# Patient Record
Sex: Male | Born: 1935
Health system: Southern US, Community
[De-identification: ages and names within clinical notes are randomized; demographics above are authoritative.]

## PROBLEM LIST (undated history)

## (undated) DIAGNOSIS — I499 Cardiac arrhythmia, unspecified: Secondary | ICD-10-CM

## (undated) DIAGNOSIS — I639 Cerebral infarction, unspecified: Secondary | ICD-10-CM

## (undated) DIAGNOSIS — R011 Cardiac murmur, unspecified: Secondary | ICD-10-CM

## (undated) DIAGNOSIS — I428 Other cardiomyopathies: Secondary | ICD-10-CM

## (undated) DIAGNOSIS — I255 Ischemic cardiomyopathy: Secondary | ICD-10-CM

## (undated) DIAGNOSIS — N183 Chronic kidney disease, stage 3 unspecified: Secondary | ICD-10-CM

## (undated) DIAGNOSIS — I10 Essential (primary) hypertension: Secondary | ICD-10-CM

## (undated) DIAGNOSIS — M1990 Unspecified osteoarthritis, unspecified site: Secondary | ICD-10-CM

## (undated) DIAGNOSIS — I2510 Atherosclerotic heart disease of native coronary artery without angina pectoris: Secondary | ICD-10-CM

## (undated) DIAGNOSIS — I498 Other specified cardiac arrhythmias: Secondary | ICD-10-CM

## (undated) DIAGNOSIS — I350 Nonrheumatic aortic (valve) stenosis: Secondary | ICD-10-CM

## (undated) DIAGNOSIS — I714 Abdominal aortic aneurysm, without rupture, unspecified: Secondary | ICD-10-CM

## (undated) DIAGNOSIS — I779 Disorder of arteries and arterioles, unspecified: Secondary | ICD-10-CM

## (undated) DIAGNOSIS — J189 Pneumonia, unspecified organism: Secondary | ICD-10-CM

## (undated) HISTORY — PX: TONSILLECTOMY: SUR1361

## (undated) HISTORY — PX: KNEE SURGERY: SHX244

## (undated) HISTORY — DX: Disorder of arteries and arterioles, unspecified: I77.9

## (undated) HISTORY — DX: Other cardiomyopathies: I42.8

## (undated) HISTORY — PX: SHOULDER SURGERY: SHX246

## (undated) HISTORY — DX: Nonrheumatic aortic (valve) stenosis: I35.0

## (undated) HISTORY — DX: Cerebral infarction, unspecified: I63.9

---
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
## 1998-06-25 ENCOUNTER — Emergency Department (HOSPITAL_COMMUNITY): Admission: EM | Admit: 1998-06-25 | Discharge: 1998-06-25 | Payer: Self-pay | Admitting: Emergency Medicine

## 1998-06-25 ENCOUNTER — Encounter: Payer: Self-pay | Admitting: Emergency Medicine

## 1998-06-29 ENCOUNTER — Ambulatory Visit (HOSPITAL_COMMUNITY): Admission: RE | Admit: 1998-06-29 | Discharge: 1998-06-29 | Payer: Self-pay | Admitting: Neurology

## 1998-06-29 ENCOUNTER — Encounter: Payer: Self-pay | Admitting: Neurology

## 2010-02-20 ENCOUNTER — Inpatient Hospital Stay (HOSPITAL_COMMUNITY): Admission: RE | Admit: 2010-02-20 | Discharge: 2010-02-23 | Payer: Self-pay | Admitting: Orthopedic Surgery

## 2010-04-16 ENCOUNTER — Inpatient Hospital Stay (HOSPITAL_COMMUNITY): Admission: EM | Admit: 2010-04-16 | Discharge: 2010-04-17 | Payer: Self-pay | Admitting: Emergency Medicine

## 2010-04-16 ENCOUNTER — Ambulatory Visit: Payer: Self-pay | Admitting: Vascular Surgery

## 2010-04-16 ENCOUNTER — Encounter (INDEPENDENT_AMBULATORY_CARE_PROVIDER_SITE_OTHER): Payer: Self-pay | Admitting: Emergency Medicine

## 2010-04-16 ENCOUNTER — Ambulatory Visit: Payer: Self-pay | Admitting: Family Medicine

## 2010-05-29 ENCOUNTER — Inpatient Hospital Stay (HOSPITAL_COMMUNITY): Admission: RE | Admit: 2010-05-29 | Discharge: 2010-06-01 | Payer: Self-pay | Admitting: Orthopedic Surgery

## 2010-08-21 ENCOUNTER — Ambulatory Visit (HOSPITAL_COMMUNITY): Admission: RE | Admit: 2010-08-21 | Payer: Self-pay | Source: Home / Self Care | Admitting: Orthopedic Surgery

## 2010-10-25 LAB — BASIC METABOLIC PANEL
BUN: 14 mg/dL (ref 6–23)
BUN: 18 mg/dL (ref 6–23)
BUN: 25 mg/dL — ABNORMAL HIGH (ref 6–23)
CO2: 26 mEq/L (ref 19–32)
Calcium: 9 mg/dL (ref 8.4–10.5)
Chloride: 107 mEq/L (ref 96–112)
Chloride: 109 mEq/L (ref 96–112)
Creatinine, Ser: 1.27 mg/dL (ref 0.4–1.5)
Creatinine, Ser: 1.28 mg/dL (ref 0.4–1.5)
Creatinine, Ser: 1.42 mg/dL (ref 0.4–1.5)
GFR calc non Af Amer: 54 mL/min — ABNORMAL LOW (ref >60)
Glucose, Bld: 108 mg/dL — ABNORMAL HIGH (ref 70–99)
Glucose, Bld: 113 mg/dL — ABNORMAL HIGH (ref 70–99)
Potassium: 3.8 mEq/L (ref 3.5–5.1)

## 2010-10-25 LAB — CBC
HCT: 30.1 % — ABNORMAL LOW (ref 39.0–52.0)
MCH: 27.6 pg (ref 26.0–34.0)
MCH: 27.8 pg (ref 26.0–34.0)
MCHC: 32.6 g/dL (ref 30.0–36.0)
MCHC: 32.7 g/dL (ref 30.0–36.0)
MCV: 84.5 fL (ref 78.0–100.0)
MCV: 84.6 fL (ref 78.0–100.0)
Platelets: 186 10*3/uL (ref 150–400)
Platelets: 194 10*3/uL (ref 150–400)
Platelets: 215 10*3/uL (ref 150–400)
RBC: 3.56 MIL/uL — ABNORMAL LOW (ref 4.22–5.81)
RDW: 14.6 % (ref 11.5–15.5)
RDW: 14.9 % (ref 11.5–15.5)
RDW: 14.9 % (ref 11.5–15.5)
WBC: 8.8 10*3/uL (ref 4.0–10.5)
WBC: 9.3 10*3/uL (ref 4.0–10.5)

## 2010-10-26 LAB — DIFFERENTIAL
Basophils Absolute: 0 10*3/uL (ref 0.0–0.1)
Basophils Absolute: 0 10*3/uL (ref 0.0–0.1)
Eosinophils Absolute: 0.2 10*3/uL (ref 0.0–0.7)
Eosinophils Relative: 2 % (ref 0–5)
Eosinophils Relative: 4 % (ref 0–5)
Lymphocytes Relative: 17 % (ref 12–46)
Lymphocytes Relative: 16 % (ref 12–46)
Lymphocytes Relative: 7 % — ABNORMAL LOW (ref 12–46)
Lymphs Abs: 1.6 10*3/uL (ref 0.7–4.0)
Lymphs Abs: 0.9 10*3/uL (ref 0.7–4.0)
Lymphs Abs: 1 10*3/uL (ref 0.7–4.0)
Monocytes Absolute: 0.5 10*3/uL (ref 0.1–1.0)
Monocytes Absolute: 0.4 10*3/uL (ref 0.1–1.0)
Monocytes Absolute: 0.5 10*3/uL (ref 0.1–1.0)
Monocytes Relative: 7 % (ref 3–12)
Neutro Abs: 11.1 10*3/uL — ABNORMAL HIGH (ref 1.7–7.7)

## 2010-10-26 LAB — COMPREHENSIVE METABOLIC PANEL
ALT: 18 U/L (ref 0–53)
AST: 22 U/L (ref 0–37)
AST: 18 U/L (ref 0–37)
Albumin: 4.3 g/dL (ref 3.5–5.2)
Albumin: 3.5 g/dL (ref 3.5–5.2)
CO2: 24 mEq/L (ref 19–32)
Calcium: 9 mg/dL (ref 8.4–10.5)
Chloride: 109 mEq/L (ref 96–112)
Creatinine, Ser: 1.62 mg/dL — ABNORMAL HIGH (ref 0.4–1.5)
GFR calc Af Amer: 55 mL/min — ABNORMAL LOW (ref >60)
GFR calc Af Amer: 50 mL/min — ABNORMAL LOW (ref >60)
GFR calc non Af Amer: 46 mL/min — ABNORMAL LOW (ref >60)
Sodium: 140 mEq/L (ref 135–145)
Total Bilirubin: 0.6 mg/dL (ref 0.3–1.2)

## 2010-10-26 LAB — POCT CARDIAC MARKERS
CKMB, poc: <1 ng/mL — ABNORMAL LOW (ref 1.0–8.0)
Myoglobin, poc: 142 ng/mL (ref 12–200)
Troponin i, poc: <0.05 ng/mL (ref 0.00–0.09)

## 2010-10-26 LAB — CROSSMATCH: Unit division: 0

## 2010-10-26 LAB — CBC
HCT: 35.5 % — ABNORMAL LOW (ref 39.0–52.0)
Hemoglobin: 13.5 g/dL (ref 13.0–17.0)
MCH: 27.2 pg (ref 26.0–34.0)
MCHC: 32.3 g/dL (ref 30.0–36.0)
Platelets: 250 10*3/uL (ref 150–400)
Platelets: 206 10*3/uL (ref 150–400)
RBC: 4.79 MIL/uL (ref 4.22–5.81)
RBC: 3.93 MIL/uL — ABNORMAL LOW (ref 4.22–5.81)
RBC: 4.2 MIL/uL — ABNORMAL LOW (ref 4.22–5.81)
RDW: 15 % (ref 11.5–15.5)
WBC: 9.1 10*3/uL (ref 4.0–10.5)
WBC: 12.6 10*3/uL — ABNORMAL HIGH (ref 4.0–10.5)

## 2010-10-26 LAB — URINALYSIS, ROUTINE W REFLEX MICROSCOPIC
Bilirubin Urine: NEGATIVE
Nitrite: NEGATIVE
Specific Gravity, Urine: 1.023 (ref 1.005–1.030)
Urobilinogen, UA: 1 mg/dL (ref 0.0–1.0)
pH: 5.5 (ref 5.0–8.0)

## 2010-10-26 LAB — BASIC METABOLIC PANEL
BUN: 40 mg/dL — ABNORMAL HIGH (ref 6–23)
Chloride: 111 mEq/L (ref 96–112)
GFR calc Af Amer: 38 mL/min — ABNORMAL LOW (ref >60)
GFR calc non Af Amer: 32 mL/min — ABNORMAL LOW (ref >60)
Potassium: 4.7 mEq/L (ref 3.5–5.1)
Sodium: 140 mEq/L (ref 135–145)

## 2010-10-26 LAB — URINE CULTURE: Culture  Setup Time: 201110140955

## 2010-10-26 LAB — PROTIME-INR: Prothrombin Time: 14.4 seconds (ref 11.6–15.2)

## 2010-10-26 LAB — SURGICAL PCR SCREEN
MRSA, PCR: NEGATIVE
Staphylococcus aureus: NEGATIVE

## 2010-10-26 LAB — PREALBUMIN: Prealbumin: 22.2 mg/dL (ref 18.0–45.0)

## 2010-10-26 LAB — D-DIMER, QUANTITATIVE: D-Dimer, Quant: 1.94 ug/mL-FEU — ABNORMAL HIGH (ref 0.00–0.48)

## 2010-10-29 LAB — BASIC METABOLIC PANEL
BUN: 19 mg/dL (ref 6–23)
BUN: 24 mg/dL — ABNORMAL HIGH (ref 6–23)
BUN: 29 mg/dL — ABNORMAL HIGH (ref 6–23)
CO2: 29 mEq/L (ref 19–32)
Calcium: 8.7 mg/dL (ref 8.4–10.5)
Calcium: 8.9 mg/dL (ref 8.4–10.5)
Chloride: 102 mEq/L (ref 96–112)
Chloride: 108 mEq/L (ref 96–112)
Creatinine, Ser: 1.4 mg/dL (ref 0.4–1.5)
Creatinine, Ser: 1.49 mg/dL (ref 0.4–1.5)
Creatinine, Ser: 1.64 mg/dL — ABNORMAL HIGH (ref 0.4–1.5)
GFR calc Af Amer: 59 mL/min — ABNORMAL LOW (ref >60)
GFR calc non Af Amer: 49 mL/min — ABNORMAL LOW (ref >60)
Glucose, Bld: 150 mg/dL — ABNORMAL HIGH (ref 70–99)
Potassium: 3.8 mEq/L (ref 3.5–5.1)

## 2010-10-29 LAB — COMPREHENSIVE METABOLIC PANEL
ALT: 17 U/L (ref 0–53)
AST: 18 U/L (ref 0–37)
CO2: 23 mEq/L (ref 19–32)
Calcium: 9.8 mg/dL (ref 8.4–10.5)
Chloride: 107 mEq/L (ref 96–112)
Creatinine, Ser: 1.51 mg/dL — ABNORMAL HIGH (ref 0.4–1.5)
GFR calc non Af Amer: 45 mL/min — ABNORMAL LOW (ref >60)
Glucose, Bld: 116 mg/dL — ABNORMAL HIGH (ref 70–99)
Total Bilirubin: 0.5 mg/dL (ref 0.3–1.2)

## 2010-10-29 LAB — CBC
HCT: 41.5 % (ref 39.0–52.0)
Hemoglobin: 13.7 g/dL (ref 13.0–17.0)
MCH: 28.4 pg (ref 26.0–34.0)
MCH: 28.5 pg (ref 26.0–34.0)
MCH: 28.9 pg (ref 26.0–34.0)
MCHC: 33 g/dL (ref 30.0–36.0)
MCHC: 33 g/dL (ref 30.0–36.0)
MCV: 86.2 fL (ref 78.0–100.0)
MCV: 86.3 fL (ref 78.0–100.0)
Platelets: 180 10*3/uL (ref 150–400)
Platelets: 188 10*3/uL (ref 150–400)
Platelets: 204 10*3/uL (ref 150–400)
RBC: 3.87 MIL/uL — ABNORMAL LOW (ref 4.22–5.81)
RBC: 4.83 MIL/uL (ref 4.22–5.81)
RDW: 14.4 % (ref 11.5–15.5)
RDW: 14.5 % (ref 11.5–15.5)
RDW: 14.8 % (ref 11.5–15.5)
WBC: 8 10*3/uL (ref 4.0–10.5)
WBC: 8.8 10*3/uL (ref 4.0–10.5)

## 2010-10-29 LAB — DIFFERENTIAL
Basophils Absolute: 0 10*3/uL (ref 0.0–0.1)
Eosinophils Absolute: 0.2 10*3/uL (ref 0.0–0.7)
Eosinophils Relative: 2 % (ref 0–5)
Lymphocytes Relative: 14 % (ref 12–46)
Lymphs Abs: 1.3 10*3/uL (ref 0.7–4.0)
Neutrophils Relative %: 78 % — ABNORMAL HIGH (ref 43–77)

## 2010-10-29 LAB — PROTIME-INR
INR: 1.11 (ref 0.00–1.49)
Prothrombin Time: 14.2 seconds (ref 11.6–15.2)

## 2010-10-29 LAB — URINE CULTURE
Colony Count: NO GROWTH
Culture: NO GROWTH

## 2010-10-29 LAB — URINALYSIS, ROUTINE W REFLEX MICROSCOPIC
Glucose, UA: NEGATIVE mg/dL
Hgb urine dipstick: NEGATIVE
Ketones, ur: NEGATIVE mg/dL
Protein, ur: NEGATIVE mg/dL
Urobilinogen, UA: 0.2 mg/dL (ref 0.0–1.0)

## 2010-10-29 LAB — TYPE AND SCREEN
ABO/RH(D): A POS
Antibody Screen: NEGATIVE

## 2011-12-04 DIAGNOSIS — Z79899 Other long term (current) drug therapy: Secondary | ICD-10-CM | POA: Diagnosis not present

## 2011-12-04 DIAGNOSIS — E782 Mixed hyperlipidemia: Secondary | ICD-10-CM | POA: Diagnosis not present

## 2011-12-04 DIAGNOSIS — Z125 Encounter for screening for malignant neoplasm of prostate: Secondary | ICD-10-CM | POA: Diagnosis not present

## 2011-12-04 DIAGNOSIS — I10 Essential (primary) hypertension: Secondary | ICD-10-CM | POA: Diagnosis not present

## 2012-06-05 DIAGNOSIS — E782 Mixed hyperlipidemia: Secondary | ICD-10-CM | POA: Diagnosis not present

## 2012-06-05 DIAGNOSIS — I10 Essential (primary) hypertension: Secondary | ICD-10-CM | POA: Diagnosis not present

## 2012-06-05 DIAGNOSIS — E786 Lipoprotein deficiency: Secondary | ICD-10-CM | POA: Diagnosis not present

## 2012-12-10 DIAGNOSIS — E782 Mixed hyperlipidemia: Secondary | ICD-10-CM | POA: Diagnosis not present

## 2012-12-10 DIAGNOSIS — N183 Chronic kidney disease, stage 3 unspecified: Secondary | ICD-10-CM | POA: Diagnosis not present

## 2012-12-10 DIAGNOSIS — Z6835 Body mass index (BMI) 35.0-35.9, adult: Secondary | ICD-10-CM | POA: Diagnosis not present

## 2012-12-10 DIAGNOSIS — Z9181 History of falling: Secondary | ICD-10-CM | POA: Diagnosis not present

## 2012-12-10 DIAGNOSIS — Z125 Encounter for screening for malignant neoplasm of prostate: Secondary | ICD-10-CM | POA: Diagnosis not present

## 2012-12-10 DIAGNOSIS — Z1331 Encounter for screening for depression: Secondary | ICD-10-CM | POA: Diagnosis not present

## 2012-12-10 DIAGNOSIS — I10 Essential (primary) hypertension: Secondary | ICD-10-CM | POA: Diagnosis not present

## 2013-06-16 DIAGNOSIS — E782 Mixed hyperlipidemia: Secondary | ICD-10-CM | POA: Diagnosis not present

## 2013-06-16 DIAGNOSIS — N183 Chronic kidney disease, stage 3 unspecified: Secondary | ICD-10-CM | POA: Diagnosis not present

## 2013-06-16 DIAGNOSIS — I10 Essential (primary) hypertension: Secondary | ICD-10-CM | POA: Diagnosis not present

## 2013-07-04 ENCOUNTER — Encounter (HOSPITAL_COMMUNITY): Payer: Self-pay | Admitting: Emergency Medicine

## 2013-07-04 ENCOUNTER — Emergency Department (HOSPITAL_COMMUNITY): Payer: Medicare Other

## 2013-07-04 ENCOUNTER — Emergency Department (HOSPITAL_COMMUNITY)
Admission: EM | Admit: 2013-07-04 | Discharge: 2013-07-04 | Disposition: A | Discharge status: Home / Self Care | Payer: Medicare Other | Attending: Emergency Medicine | Admitting: Emergency Medicine

## 2013-07-04 DIAGNOSIS — R079 Chest pain, unspecified: Secondary | ICD-10-CM | POA: Diagnosis not present

## 2013-07-04 DIAGNOSIS — I10 Essential (primary) hypertension: Secondary | ICD-10-CM | POA: Insufficient documentation

## 2013-07-04 DIAGNOSIS — R112 Nausea with vomiting, unspecified: Secondary | ICD-10-CM

## 2013-07-04 DIAGNOSIS — R110 Nausea: Secondary | ICD-10-CM | POA: Diagnosis not present

## 2013-07-04 DIAGNOSIS — Z79899 Other long term (current) drug therapy: Secondary | ICD-10-CM | POA: Diagnosis not present

## 2013-07-04 DIAGNOSIS — E669 Obesity, unspecified: Secondary | ICD-10-CM | POA: Diagnosis not present

## 2013-07-04 DIAGNOSIS — R1013 Epigastric pain: Secondary | ICD-10-CM

## 2013-07-04 DIAGNOSIS — R231 Pallor: Secondary | ICD-10-CM | POA: Diagnosis not present

## 2013-07-04 DIAGNOSIS — K2970 Gastritis, unspecified, without bleeding: Secondary | ICD-10-CM | POA: Diagnosis not present

## 2013-07-04 DIAGNOSIS — I700 Atherosclerosis of aorta: Secondary | ICD-10-CM | POA: Diagnosis not present

## 2013-07-04 LAB — COMPREHENSIVE METABOLIC PANEL
Albumin: 3.7 g/dL (ref 3.5–5.2)
Alkaline Phosphatase: 75 U/L (ref 39–117)
BUN: 31 mg/dL — ABNORMAL HIGH (ref 6–23)
Chloride: 104 mEq/L (ref 96–112)
GFR calc Af Amer: 53 mL/min — ABNORMAL LOW (ref >90)
Glucose, Bld: 149 mg/dL — ABNORMAL HIGH (ref 70–99)
Potassium: 4.5 mEq/L (ref 3.5–5.1)
Total Bilirubin: 0.2 mg/dL — ABNORMAL LOW (ref 0.3–1.2)

## 2013-07-04 LAB — URINALYSIS, ROUTINE W REFLEX MICROSCOPIC
Hgb urine dipstick: NEGATIVE
Nitrite: NEGATIVE
Protein, ur: NEGATIVE mg/dL
Specific Gravity, Urine: 1.018 (ref 1.005–1.030)
Urobilinogen, UA: 1 mg/dL (ref 0.0–1.0)
pH: 6 (ref 5.0–8.0)

## 2013-07-04 LAB — CBC WITH DIFFERENTIAL/PLATELET
Hemoglobin: 13.6 g/dL (ref 13.0–17.0)
Lymphocytes Relative: 10 % — ABNORMAL LOW (ref 12–46)
Lymphs Abs: 1.1 10*3/uL (ref 0.7–4.0)
Monocytes Relative: 4 % (ref 3–12)
Neutro Abs: 9.6 10*3/uL — ABNORMAL HIGH (ref 1.7–7.7)
Neutrophils Relative %: 85 % — ABNORMAL HIGH (ref 43–77)
RBC: 4.55 MIL/uL (ref 4.22–5.81)

## 2013-07-04 LAB — POCT I-STAT TROPONIN I: Troponin i, poc: 0 ng/mL (ref 0.00–0.08)

## 2013-07-04 LAB — OCCULT BLOOD, POC DEVICE: Fecal Occult Bld: NEGATIVE

## 2013-07-04 LAB — LACTIC ACID, PLASMA: Lactic Acid, Venous: 0.8 mmol/L (ref 0.5–2.2)

## 2013-07-04 LAB — LIPASE, BLOOD: Lipase: 28 U/L (ref 11–59)

## 2013-07-04 LAB — URINE MICROSCOPIC-ADD ON

## 2013-07-04 MED ORDER — FAMOTIDINE IN NACL 20-0.9 MG/50ML-% IV SOLN
20.0000 mg | Freq: Once | INTRAVENOUS | Status: AC
  Administered 2013-07-04: 20 mg via INTRAVENOUS
  Filled 2013-07-04: qty 50

## 2013-07-04 MED ORDER — ONDANSETRON HCL 4 MG/2ML IJ SOLN
4.0000 mg | Freq: Once | INTRAMUSCULAR | Status: AC
  Administered 2013-07-04: 4 mg via INTRAVENOUS
  Filled 2013-07-04: qty 2

## 2013-07-04 MED ORDER — MORPHINE SULFATE 4 MG/ML IJ SOLN
4.0000 mg | Freq: Once | INTRAMUSCULAR | Status: AC
  Administered 2013-07-04: 4 mg via INTRAVENOUS
  Filled 2013-07-04: qty 1

## 2013-07-04 MED ORDER — SODIUM CHLORIDE 0.9 % IV SOLN
Freq: Once | INTRAVENOUS | Status: AC
  Administered 2013-07-04: 1000 mL via INTRAVENOUS

## 2013-07-04 MED ORDER — ONDANSETRON 4 MG PO TBDP: 4.0000 mg | ORAL_TABLET | Freq: Three times a day (TID) | ORAL | Status: DC | PRN

## 2013-07-04 MED ORDER — IOHEXOL 300 MG/ML  SOLN
25.0000 mL | INTRAMUSCULAR | Status: AC | PRN
  Administered 2013-07-04 (×2): 25 mL via ORAL

## 2013-07-04 MED ORDER — IOHEXOL 300 MG/ML  SOLN
80.0000 mL | Freq: Once | INTRAMUSCULAR | Status: AC | PRN
  Administered 2013-07-04: 80 mL via INTRAVENOUS

## 2013-07-04 NOTE — ED Provider Notes (Addendum)
Complaint of epigastric pain, nonradiating accompanied multiple episodes of vomiting onset 10 PM tonight. Denies chest pain denies shortness of breath. This had similar pain in the past however did not learn etiology. Denies diarrhea denies fever. And exam patient is alert Glasgow Coma Score 15. Minimally tender at theepigastruim    Date: 07/04/2013  Rate: 53  Rhythm: sinus bradycardia and premature ventricular contractions (PVC)  QRS Axis: normal  Intervals: normal  ST/T Wave abnormalities: nonspecific T wave changes  Conduction Disutrbances:none  Narrative Interpretation:   Old EKG Reviewed: none available  Doug Sou, MD 07/04/13 0730  Pt signed out to Dr. Adriana Simas 830 am  Doug Sou, MD 07/04/13 [phone]

## 2013-07-04 NOTE — ED Notes (Signed)
phlebotomy at bedside.  

## 2013-07-04 NOTE — ED Provider Notes (Signed)
Medical screening examination/treatment/procedure(s) were conducted as a shared visit with non-physician practitioner(s) and myself.  I personally evaluated the patient during the encounter.  EKG Interpretation   None        Doug Sou, MD 07/04/13 [phone]

## 2013-07-04 NOTE — ED Provider Notes (Signed)
CSN: [id number]     Arrival date & time 07/04/13  0201 History   First MD Initiated Contact with Patient 07/04/13 0242     Chief Complaint  Patient presents with  . Abdominal Pain   (Consider location/radiation/quality/duration/timing/severity/associated sxs/prior Treatment) HPI Comments: Patient awakened from sleep with epigastric fullness and tightness with nausea. Denies SOB, diaphoresis.   Patient is a 79 y.o. male presenting with abdominal pain. The history is provided by the patient.  Abdominal Pain Pain location:  Epigastric Pain quality: fullness and heavy   Pain radiates to:  Does not radiate Pain severity:  Moderate Onset quality:  Sudden Duration:  5 hours Timing:  Constant Progression:  Unchanged Chronicity:  New Context: awakening from sleep   Context: not diet changes and not retching   Relieved by:  None tried Worsened by:  Nothing tried Ineffective treatments:  None tried Associated symptoms: chest pain and nausea   Associated symptoms: no constipation, no cough, no diarrhea, no fever, no shortness of breath and no vomiting   Risk factors: being elderly and obesity     Past Medical History  Diagnosis Date  . Hypertension    Past Surgical History  Procedure Laterality Date  . Knee surgery     History reviewed. No pertinent family history. History  Substance Use Topics  . Smoking status: Not on file  . Smokeless tobacco: Not on file  . Alcohol Use: Not on file    Review of Systems  Constitutional: Negative for fever.  Respiratory: Negative for cough and shortness of breath.   Cardiovascular: Positive for chest pain.  Gastrointestinal: Positive for nausea and abdominal pain. Negative for vomiting, diarrhea and constipation.  Skin: Positive for pallor.  All other systems reviewed and are negative.    Allergies  Review of patient's allergies indicates no known allergies.  Home Medications   Current Outpatient Rx  Name  Route  Sig  Dispense   Refill  . PRESCRIPTION MEDICATION   Oral   Take 1 tablet by mouth daily. Blood Pressure          BP 151/59  Pulse 55  Temp(Src) 97.3 F (36.3 C) (Oral)  Resp 17  SpO2 91% Physical Exam  Nursing note and vitals reviewed. Constitutional: He appears well-developed and well-nourished.  obese  HENT:  Head: Normocephalic and atraumatic.  Eyes: Pupils are equal, round, and reactive to light.  Neck: Normal range of motion.  Cardiovascular: Normal rate and regular rhythm.   Pulmonary/Chest: Effort normal and breath sounds normal.  Abdominal: Soft. There is no tenderness.  Musculoskeletal: Normal range of motion.  Neurological: He is alert.  Skin: Skin is warm and dry. There is pallor.    ED Course  Procedures (including critical care time) Labs Review Labs Reviewed  CBC WITH DIFFERENTIAL - Abnormal; Notable for the following:    WBC 11.4 (*)    Neutrophils Relative % 85 (*)    Neutro Abs 9.6 (*)    Lymphocytes Relative 10 (*)    All other components within normal limits  COMPREHENSIVE METABOLIC PANEL - Abnormal; Notable for the following:    Glucose, Bld 149 (*)    BUN 31 (*)    Creatinine, Ser 1.37 (*)    Total Bilirubin 0.2 (*)    GFR calc non Af Amer 46 (*)    GFR calc Af Amer 53 (*)    All other components within normal limits  LACTIC ACID, PLASMA  LIPASE, BLOOD  URINALYSIS, ROUTINE W REFLEX MICROSCOPIC  POCT I-STAT TROPONIN I  OCCULT BLOOD, POC DEVICE  POCT I-STAT TROPONIN I   Imaging Review Dg Chest Port 1 View  07/04/2013   CLINICAL DATA:  Chest pain  EXAM: PORTABLE CHEST - 1 VIEW  COMPARISON:  04/16/2010  FINDINGS: Normal heart size. Aortic atherosclerosis. No edema or infiltrate. No effusion or pneumothorax. Remote posterior right rib fractures. No acute osseous findings.  IMPRESSION: No active disease.   Electronically Signed   By: Tiburcio Pea M.D.   On: 07/04/2013 03:31    EKG Interpretation   None       MDM  No diagnosis found. Will obtain  CT Abdomen    Arman Filter, NP 07/04/13 [phone]

## 2013-07-04 NOTE — ED Notes (Signed)
CT informed that the pt has finished his contrast.

## 2013-07-04 NOTE — ED Provider Notes (Signed)
Patient received from NP Manus Rudd at shift change.  79 year old male presenting with epigastric pain for the past 5 hours. No associated nausea, vomiting, or diarrhea.  Labs were ordered chest x-ray obtained. Patient given medication without any improvement.  Plan:  CT abdomen and pelvis pending. Disposition following results.  Results for orders placed during the hospital encounter of 07/04/13  CBC WITH DIFFERENTIAL      Result Value Range   WBC 11.4 (*) 4.0 - 10.5 K/uL   RBC 4.55  4.22 - 5.81 MIL/uL   Hemoglobin 13.6  13.0 - 17.0 g/dL   HCT 91.4  78.2 - 95.6 %   MCV 87.5  78.0 - 100.0 fL   MCH 29.9  26.0 - 34.0 pg   MCHC 34.2  30.0 - 36.0 g/dL   RDW 21.3  08.6 - 57.8 %   Platelets 217  150 - 400 K/uL   Neutrophils Relative % 85 (*) 43 - 77 %   Neutro Abs 9.6 (*) 1.7 - 7.7 K/uL   Lymphocytes Relative 10 (*) 12 - 46 %   Lymphs Abs 1.1  0.7 - 4.0 K/uL   Monocytes Relative 4  3 - 12 %   Monocytes Absolute 0.5  0.1 - 1.0 K/uL   Eosinophils Relative 1  0 - 5 %   Eosinophils Absolute 0.1  0.0 - 0.7 K/uL   Basophils Relative 0  0 - 1 %   Basophils Absolute 0.0  0.0 - 0.1 K/uL  COMPREHENSIVE METABOLIC PANEL      Result Value Range   Sodium 137  135 - 145 mEq/L   Potassium 4.5  3.5 - 5.1 mEq/L   Chloride 104  96 - 112 mEq/L   CO2 23  19 - 32 mEq/L   Glucose, Bld 149 (*) 70 - 99 mg/dL   BUN 31 (*) 6 - 23 mg/dL   Creatinine, Ser 4.69 (*) 0.50 - 1.35 mg/dL   Calcium 9.4  8.4 - 62.9 mg/dL   Total Protein 6.7  6.0 - 8.3 g/dL   Albumin 3.7  3.5 - 5.2 g/dL   AST 20  0 - 37 U/L   ALT 18  0 - 53 U/L   Alkaline Phosphatase 75  39 - 117 U/L   Total Bilirubin 0.2 (*) 0.3 - 1.2 mg/dL   GFR calc non Af Amer 46 (*) >90 mL/min   GFR calc Af Amer 53 (*) >90 mL/min  LACTIC ACID, PLASMA      Result Value Range   Lactic Acid, Venous 0.8  0.5 - 2.2 mmol/L  LIPASE, BLOOD      Result Value Range   Lipase 28  11 - 59 U/L  URINALYSIS, ROUTINE W REFLEX MICROSCOPIC      Result Value Range   Color,  Urine YELLOW  YELLOW   APPearance CLEAR  CLEAR   Specific Gravity, Urine 1.018  1.005 - 1.030   pH 6.0  5.0 - 8.0   Glucose, UA NEGATIVE  NEGATIVE mg/dL   Hgb urine dipstick NEGATIVE  NEGATIVE   Bilirubin Urine NEGATIVE  NEGATIVE   Ketones, ur NEGATIVE  NEGATIVE mg/dL   Protein, ur NEGATIVE  NEGATIVE mg/dL   Urobilinogen, UA 1.0  0.0 - 1.0 mg/dL   Nitrite NEGATIVE  NEGATIVE   Leukocytes, UA TRACE (*) NEGATIVE  URINE MICROSCOPIC-ADD ON      Result Value Range   WBC, UA 3-6  <3 WBC/hpf  POCT I-STAT TROPONIN I  Result Value Range   Troponin i, poc 0.00  0.00 - 0.08 ng/mL   Comment 3           OCCULT BLOOD, POC DEVICE      Result Value Range   Fecal Occult Bld NEGATIVE  NEGATIVE  POCT I-STAT TROPONIN I      Result Value Range   Troponin i, poc 0.00  0.00 - 0.08 ng/mL   Comment 3           POCT I-STAT TROPONIN I      Result Value Range   Troponin i, poc 0.00  0.00 - 0.08 ng/mL   Comment 3            Ct Abdomen Pelvis W Contrast  07/04/2013   CLINICAL DATA:  Mid abdominal pain.  Nausea and vomiting.  EXAM: CT ABDOMEN AND PELVIS WITH CONTRAST  TECHNIQUE: Multidetector CT imaging of the abdomen and pelvis was performed using the standard protocol following bolus administration of intravenous contrast.  CONTRAST:  80mL OMNIPAQUE IOHEXOL 300 MG/ML  SOLN  COMPARISON:  None.  FINDINGS: Visualized lung bases are unremarkable. Several small low-attenuation lesions of the liver likely represent benign cysts. The gallbladder, pancreas, spleen and adrenal glands are unremarkable. Lesion of the posterior mid left kidney originates from the cortex that extends in an exophytic fashion posteriorly. This 2.4 x 2.5 x 2.4 in diameter. Internal density is approximately 50-55 Hounsfield units on both venous and delayed phases and findings are either consistent with a hyperdense cyst or cystic/solid tumor with enhancement pattern not fully characterized by current CT. Consider further workup with either  MRI of the abdomen or followup CT to assess stability.  Bowel loops are of normal caliber and show no evidence of obstruction. No inflammatory process is seen. There is no evidence of free fluid or free air. The bladder is unremarkable. No masses or enlarged lymph nodes are seen. Atherosclerotic disease of the abdominal aorta present without aneurysm. No hernias are identified. Bony structures show moderate spondylosis of the lumbar spine, particularly at the L2-3 and L4-5 levels.  IMPRESSION: 1. No acute findings in the abdomen or pelvis. 2. 2.5 cm hyperdense exophytic lesion of the posterior left mid kidney with differential including hyperdense cyst or cystic/solid tumor. Enhancement pattern is not fully characterized. Consider further workup with either MRI of the abdomen or followup CT.   Electronically Signed   By: Irish Lack M.D.   On: 07/04/2013 10:14   Dg Chest Port 1 View  07/04/2013   CLINICAL DATA:  Chest pain  EXAM: PORTABLE CHEST - 1 VIEW  COMPARISON:  04/16/2010  FINDINGS: Normal heart size. Aortic atherosclerosis. No edema or infiltrate. No effusion or pneumothorax. Remote posterior right rib fractures. No acute osseous findings.  IMPRESSION: No active disease.   Electronically Signed   By: Tiburcio Pea M.D.   On: 07/04/2013 03:31   Labs as above.  CT abd/pelvis revealing lesion of left mid-kidney-- possible benign cyst vs tumor, recommended FU MRI or CT in a few weeks.  Pt re-evaluated, states is now pain free.  Discussed lab and imaging results with pt, he acknowledged understanding.  FU with PCP to discuss this ED visit and schedule repeat CT in 6-8 weeks-- copies of labs and imaging studies given for physician review. Discussed plan with pt, he agreed.  Return precautions advised.  Discussed pt with Dr. Adriana Simas who agrees with assessment and plan of care.   Garlon Hatchet, PA-C 07/04/13 1312

## 2013-07-04 NOTE — ED Notes (Signed)
Pt alert and mentating appropriately. Pt given d/c teaching, follow up care and prescription. Pt verbalizes understanding and has no further questions upon d/c. Pt requested wheelchair to get to lobby to wait for daughter. NAD noted upon d/c.

## 2013-07-04 NOTE — ED Notes (Signed)
Pt resting in bed. Pt states he does not want anything for pain or nausea that this time. Notified pt of delay in going to CT.

## 2013-07-04 NOTE — ED Notes (Signed)
Pt returned from CT and placed back on monitor. NAD noted at this time. Pt denies further needs at this time.

## 2013-07-04 NOTE — ED Notes (Signed)
Pt ambulatory leaving ER. Pt does not need wheelchair at this time. Pt shown to lobby to wait for daughter to come pick him up. NAD noted. Ambulatory with steady gait.

## 2013-07-04 NOTE — ED Notes (Signed)
Pt awoke with epigastric pain about 10 o'clock tonight; no radiation reported.  Pt denies shortness of breath, diaphoresis.  Pt [place] [facility] [place] [facility] and EMS brought pt here.  Pt sinus brady with frequent PVC's.  No cardiac hx reported.  Pt has hx of hypertension.

## 2013-07-17 DIAGNOSIS — N289 Disorder of kidney and ureter, unspecified: Secondary | ICD-10-CM | POA: Diagnosis not present

## 2013-07-20 ENCOUNTER — Other Ambulatory Visit: Payer: Self-pay | Admitting: Family Medicine

## 2013-07-20 DIAGNOSIS — N2889 Other specified disorders of kidney and ureter: Secondary | ICD-10-CM

## 2013-07-21 ENCOUNTER — Ambulatory Visit
Admission: RE | Admit: 2013-07-21 | Discharge: 2013-07-21 | Disposition: A | Discharge status: Home / Self Care | Payer: Medicare Other | Source: Ambulatory Visit | Attending: Family Medicine | Admitting: Family Medicine

## 2013-07-21 DIAGNOSIS — K7689 Other specified diseases of liver: Secondary | ICD-10-CM | POA: Diagnosis not present

## 2013-07-21 DIAGNOSIS — N2889 Other specified disorders of kidney and ureter: Secondary | ICD-10-CM

## 2013-07-21 MED ORDER — GADOBENATE DIMEGLUMINE 529 MG/ML IV SOLN
20.0000 mL | Freq: Once | INTRAVENOUS | Status: AC | PRN
  Administered 2013-07-21: 20 mL via INTRAVENOUS

## 2013-12-14 DIAGNOSIS — Z6834 Body mass index (BMI) 34.0-34.9, adult: Secondary | ICD-10-CM | POA: Diagnosis not present

## 2013-12-14 DIAGNOSIS — Z125 Encounter for screening for malignant neoplasm of prostate: Secondary | ICD-10-CM | POA: Diagnosis not present

## 2013-12-14 DIAGNOSIS — I10 Essential (primary) hypertension: Secondary | ICD-10-CM | POA: Diagnosis not present

## 2013-12-14 DIAGNOSIS — N183 Chronic kidney disease, stage 3 unspecified: Secondary | ICD-10-CM | POA: Diagnosis not present

## 2013-12-14 DIAGNOSIS — E782 Mixed hyperlipidemia: Secondary | ICD-10-CM | POA: Diagnosis not present

## 2013-12-14 DIAGNOSIS — Z1331 Encounter for screening for depression: Secondary | ICD-10-CM | POA: Diagnosis not present

## 2013-12-14 DIAGNOSIS — Z9181 History of falling: Secondary | ICD-10-CM | POA: Diagnosis not present

## 2014-06-16 DIAGNOSIS — I10 Essential (primary) hypertension: Secondary | ICD-10-CM | POA: Diagnosis not present

## 2014-06-16 DIAGNOSIS — N183 Chronic kidney disease, stage 3 (moderate): Secondary | ICD-10-CM | POA: Diagnosis not present

## 2014-06-16 DIAGNOSIS — E782 Mixed hyperlipidemia: Secondary | ICD-10-CM | POA: Diagnosis not present

## 2014-12-17 DIAGNOSIS — N183 Chronic kidney disease, stage 3 (moderate): Secondary | ICD-10-CM | POA: Diagnosis not present

## 2014-12-17 DIAGNOSIS — Z125 Encounter for screening for malignant neoplasm of prostate: Secondary | ICD-10-CM | POA: Diagnosis not present

## 2014-12-17 DIAGNOSIS — Z9181 History of falling: Secondary | ICD-10-CM | POA: Diagnosis not present

## 2014-12-17 DIAGNOSIS — Z6833 Body mass index (BMI) 33.0-33.9, adult: Secondary | ICD-10-CM | POA: Diagnosis not present

## 2014-12-17 DIAGNOSIS — E782 Mixed hyperlipidemia: Secondary | ICD-10-CM | POA: Diagnosis not present

## 2014-12-17 DIAGNOSIS — I10 Essential (primary) hypertension: Secondary | ICD-10-CM | POA: Diagnosis not present

## 2014-12-17 DIAGNOSIS — Z1389 Encounter for screening for other disorder: Secondary | ICD-10-CM | POA: Diagnosis not present

## 2014-12-17 DIAGNOSIS — R001 Bradycardia, unspecified: Secondary | ICD-10-CM | POA: Diagnosis not present

## 2014-12-21 ENCOUNTER — Inpatient Hospital Stay (HOSPITAL_COMMUNITY)
Admission: EM | Admit: 2014-12-21 | Discharge: 2014-12-24 | DRG: 287 | Disposition: A | Discharge status: Home / Self Care | Payer: Medicare Other | Attending: Internal Medicine | Admitting: Internal Medicine

## 2014-12-21 ENCOUNTER — Telehealth: Payer: Self-pay | Admitting: Cardiovascular Disease

## 2014-12-21 ENCOUNTER — Emergency Department (HOSPITAL_COMMUNITY): Payer: Medicare Other

## 2014-12-21 ENCOUNTER — Other Ambulatory Visit: Payer: Self-pay

## 2014-12-21 ENCOUNTER — Encounter (HOSPITAL_COMMUNITY): Payer: Self-pay | Admitting: Cardiology

## 2014-12-21 DIAGNOSIS — I493 Ventricular premature depolarization: Secondary | ICD-10-CM | POA: Diagnosis not present

## 2014-12-21 DIAGNOSIS — R011 Cardiac murmur, unspecified: Secondary | ICD-10-CM | POA: Diagnosis present

## 2014-12-21 DIAGNOSIS — I10 Essential (primary) hypertension: Secondary | ICD-10-CM | POA: Diagnosis present

## 2014-12-21 DIAGNOSIS — Z96653 Presence of artificial knee joint, bilateral: Secondary | ICD-10-CM | POA: Diagnosis present

## 2014-12-21 DIAGNOSIS — N183 Chronic kidney disease, stage 3 unspecified: Secondary | ICD-10-CM | POA: Diagnosis present

## 2014-12-21 DIAGNOSIS — M174 Other bilateral secondary osteoarthritis of knee: Secondary | ICD-10-CM | POA: Diagnosis not present

## 2014-12-21 DIAGNOSIS — R5382 Chronic fatigue, unspecified: Secondary | ICD-10-CM

## 2014-12-21 DIAGNOSIS — I499 Cardiac arrhythmia, unspecified: Secondary | ICD-10-CM | POA: Diagnosis not present

## 2014-12-21 DIAGNOSIS — I350 Nonrheumatic aortic (valve) stenosis: Secondary | ICD-10-CM

## 2014-12-21 DIAGNOSIS — I498 Other specified cardiac arrhythmias: Secondary | ICD-10-CM | POA: Diagnosis present

## 2014-12-21 DIAGNOSIS — Z7982 Long term (current) use of aspirin: Secondary | ICD-10-CM

## 2014-12-21 DIAGNOSIS — I129 Hypertensive chronic kidney disease with stage 1 through stage 4 chronic kidney disease, or unspecified chronic kidney disease: Secondary | ICD-10-CM | POA: Diagnosis present

## 2014-12-21 DIAGNOSIS — Z87891 Personal history of nicotine dependence: Secondary | ICD-10-CM | POA: Diagnosis not present

## 2014-12-21 DIAGNOSIS — R001 Bradycardia, unspecified: Secondary | ICD-10-CM | POA: Diagnosis not present

## 2014-12-21 DIAGNOSIS — Z7901 Long term (current) use of anticoagulants: Secondary | ICD-10-CM | POA: Diagnosis not present

## 2014-12-21 DIAGNOSIS — Z6833 Body mass index (BMI) 33.0-33.9, adult: Secondary | ICD-10-CM

## 2014-12-21 DIAGNOSIS — R0600 Dyspnea, unspecified: Secondary | ICD-10-CM | POA: Diagnosis not present

## 2014-12-21 DIAGNOSIS — I272 Other secondary pulmonary hypertension: Secondary | ICD-10-CM | POA: Diagnosis present

## 2014-12-21 DIAGNOSIS — R079 Chest pain, unspecified: Secondary | ICD-10-CM | POA: Diagnosis not present

## 2014-12-21 DIAGNOSIS — I517 Cardiomegaly: Secondary | ICD-10-CM | POA: Diagnosis not present

## 2014-12-21 DIAGNOSIS — E669 Obesity, unspecified: Secondary | ICD-10-CM | POA: Diagnosis present

## 2014-12-21 DIAGNOSIS — M1990 Unspecified osteoarthritis, unspecified site: Secondary | ICD-10-CM | POA: Insufficient documentation

## 2014-12-21 HISTORY — DX: Other specified cardiac arrhythmias: I49.8

## 2014-12-21 HISTORY — DX: Cardiac arrhythmia, unspecified: I49.9

## 2014-12-21 HISTORY — DX: Chronic kidney disease, stage 3 (moderate): N18.3

## 2014-12-21 HISTORY — DX: Atherosclerotic heart disease of native coronary artery without angina pectoris: I25.10

## 2014-12-21 HISTORY — DX: Chronic kidney disease, stage 3 unspecified: N18.30

## 2014-12-21 HISTORY — DX: Cardiac murmur, unspecified: R01.1

## 2014-12-21 HISTORY — DX: Essential (primary) hypertension: I10

## 2014-12-21 HISTORY — DX: Unspecified osteoarthritis, unspecified site: M19.90

## 2014-12-21 LAB — BRAIN NATRIURETIC PEPTIDE: B Natriuretic Peptide: 517.7 pg/mL — ABNORMAL HIGH (ref 0.0–100.0)

## 2014-12-21 LAB — CBC
HCT: 41.1 % (ref 39.0–52.0)
HEMATOCRIT: 40.2 % (ref 39.0–52.0)
HEMOGLOBIN: 13.9 g/dL (ref 13.0–17.0)
HEMOGLOBIN: 13.6 g/dL (ref 13.0–17.0)
MCH: 29.3 pg (ref 26.0–34.0)
MCH: 29.4 pg (ref 26.0–34.0)
MCHC: 33.8 g/dL (ref 30.0–36.0)
MCHC: 33.8 g/dL (ref 30.0–36.0)
MCV: 86.5 fL (ref 78.0–100.0)
MCV: 86.8 fL (ref 78.0–100.0)
PLATELETS: 206 10*3/uL (ref 150–400)
Platelets: 198 10*3/uL (ref 150–400)
RBC: 4.75 MIL/uL (ref 4.22–5.81)
RBC: 4.63 MIL/uL (ref 4.22–5.81)
RDW: 14.3 % (ref 11.5–15.5)
RDW: 14.4 % (ref 11.5–15.5)
WBC: 8.3 10*3/uL (ref 4.0–10.5)
WBC: 7.2 10*3/uL (ref 4.0–10.5)

## 2014-12-21 LAB — BASIC METABOLIC PANEL
Anion gap: 11 (ref 5–15)
BUN: 23 mg/dL — ABNORMAL HIGH (ref 6–20)
CALCIUM: 10 mg/dL (ref 8.9–10.3)
CO2: 23 mmol/L (ref 22–32)
Chloride: 107 mmol/L (ref 101–111)
Creatinine, Ser: 1.42 mg/dL — ABNORMAL HIGH (ref 0.61–1.24)
GFR calc non Af Amer: 43 mL/min — ABNORMAL LOW (ref >60)
GFR, EST AFRICAN AMERICAN: 50 mL/min — AB (ref >60)
Glucose, Bld: 102 mg/dL — ABNORMAL HIGH (ref 70–99)
POTASSIUM: 4.2 mmol/L (ref 3.5–5.1)
Sodium: 141 mmol/L (ref 135–145)

## 2014-12-21 LAB — MAGNESIUM: Magnesium: 2.1 mg/dL (ref 1.7–2.4)

## 2014-12-21 LAB — CREATININE, SERUM
Creatinine, Ser: 1.53 mg/dL — ABNORMAL HIGH (ref 0.61–1.24)
GFR calc Af Amer: 46 mL/min — ABNORMAL LOW (ref >60)
GFR, EST NON AFRICAN AMERICAN: 40 mL/min — AB (ref >60)

## 2014-12-21 LAB — TSH: TSH: 1.156 u[IU]/mL (ref 0.350–4.500)

## 2014-12-21 LAB — I-STAT TROPONIN, ED: Troponin i, poc: 0.01 ng/mL (ref 0.00–0.08)

## 2014-12-21 LAB — TROPONIN I: Troponin I: <0.03 ng/mL (ref <0.031)

## 2014-12-21 MED ORDER — LISINOPRIL 10 MG PO TABS
10.0000 mg | ORAL_TABLET | Freq: Every day | ORAL | Status: DC
Start: 2014-12-22 — End: 2014-12-22
  Administered 2014-12-22: 10 mg via ORAL
  Filled 2014-12-21: qty 1

## 2014-12-21 MED ORDER — ACETAMINOPHEN 325 MG PO TABS
650.0000 mg | ORAL_TABLET | ORAL | Status: DC | PRN
Start: 2014-12-21 — End: 2014-12-23

## 2014-12-21 MED ORDER — SODIUM CHLORIDE 0.9 % IJ SOLN: 3.0000 mL | INTRAMUSCULAR | Status: DC | PRN

## 2014-12-21 MED ORDER — ASPIRIN EC 81 MG PO TBEC
81.0000 mg | DELAYED_RELEASE_TABLET | Freq: Every day | ORAL | Status: DC
  Administered 2014-12-22 – 2014-12-24 (×2): 81 mg via ORAL
  Filled 2014-12-21 (×3): qty 1

## 2014-12-21 MED ORDER — ONDANSETRON HCL 4 MG/2ML IJ SOLN: 4.0000 mg | Freq: Four times a day (QID) | INTRAMUSCULAR | Status: DC | PRN

## 2014-12-21 MED ORDER — DILTIAZEM HCL ER BEADS 240 MG PO CP24: 240.0000 mg | ORAL_CAPSULE | Freq: Every day | ORAL | Status: DC

## 2014-12-21 MED ORDER — SODIUM CHLORIDE 0.9 % IV SOLN: 250.0000 mL | INTRAVENOUS | Status: DC | PRN

## 2014-12-21 MED ORDER — SODIUM CHLORIDE 0.9 % IJ SOLN
3.0000 mL | Freq: Two times a day (BID) | INTRAMUSCULAR | Status: DC
  Administered 2014-12-21 – 2014-12-23 (×4): 3 mL via INTRAVENOUS

## 2014-12-21 MED ORDER — NITROGLYCERIN 0.4 MG SL SUBL
0.4000 mg | SUBLINGUAL_TABLET | SUBLINGUAL | Status: DC | PRN
Start: 2014-12-21 — End: 2014-12-24

## 2014-12-21 MED ORDER — DILTIAZEM HCL ER COATED BEADS 240 MG PO CP24
240.0000 mg | ORAL_CAPSULE | Freq: Every day | ORAL | Status: DC
  Administered 2014-12-22: 240 mg via ORAL
  Filled 2014-12-21 (×3): qty 1

## 2014-12-21 MED ORDER — HEPARIN SODIUM (PORCINE) 5000 UNIT/ML IJ SOLN
5000.0000 [IU] | Freq: Three times a day (TID) | INTRAMUSCULAR | Status: DC
  Administered 2014-12-21 – 2014-12-23 (×5): 5000 [IU] via SUBCUTANEOUS
  Filled 2014-12-21 (×8): qty 1

## 2014-12-21 NOTE — Progress Notes (Signed)
Report received from the ED at 1748 and pt arrived to the unit via stretcher at 1830 with belongings and daughter at bedside. Pt VSS, telemetry applied and verified; oriented to the room and unit, safety plan explained to pt; pt in bed comfortably with call light within reach. Will closely monitor; Reported off to incoming RN. Francis Gaines Emme Rosenau RN.

## 2014-12-21 NOTE — Telephone Encounter (Signed)
Pt's daughter called in stating that he is a former [place] pt and within the past couple of days his heart rate has been at 30 bpm and she would like for him to be seen today if possible . Please call   Thanks

## 2014-12-21 NOTE — Telephone Encounter (Signed)
Spoke with pt dtr, she reports the patients heart rate has been running in the 30's. She is not sure how long this has been going on, he told her last night. She reports he was recently started on a new medicine by another doctor, she is unsure of the medicine or the provider. She is not able to tell me about how he feels. Referred patient to the provider that started the new medications since we have not seen the patient since 2011. Patient dtr voiced understanding to call the other provider.

## 2014-12-21 NOTE — ED Notes (Signed)
Pt reports that for the past couple of days he has felt weak and had chest pain. States that he took his pulse at home and it was 32.

## 2014-12-21 NOTE — H&P (Signed)
Patient ID: Clinton Holmes MRN: [id number], DOB/AGE: [id number]   Admit date: 12/21/2014   Primary Physician: Leonides Sake, MD Primary Cardiologist: new - previously seen by Dr. Rollene Fare (2011)  Pt. Profile:  81 y/o male without prior cardiac hx who presented to the ED today 2/2 bradycardia and has been found to have ventricular bigeminy.  Problem List  Past Medical History  Diagnosis Date  . Hypertension   . OA (osteoarthritis)     a. 2011 s/p bilat TKA.  . CKD (chronic kidney disease), stage III   . Essential hypertension   . Ventricular bigeminy     a. 12/2014  . Murmur     Past Surgical History  Procedure Laterality Date  . Knee surgery      Allergies  No Known Allergies  HPI  81 y/o male w/o prior cardiac hx.  He does have a h/o HTN and also a FH of CAD with his father dying @ 37 from an MI and his mother requiring CABG in her lifetime (died @ age 69).  He reports having had a stress test in 2011 as part of a preoperative evaluation prior to bilat total knee arthroplasties.  Stress testing was reportedly normal.  He has not since seen cardiology.   He lives by himself and notes that he has had reduced energy since the beginning of winter.  He is relatively sedentary and just last week, he decided that he's go for a walk but had very poor exercise tolerance with DOE shortly into his walk, and thus he stopped walking and hasn't tried again since.  Last Friday, he went to his PCP for a 6 month check up and he was noted to be bradycardic.  He says that his HR was 44.  His diltiazem dose was reduced from 420 mg daily to 240 mg daily.  He's been checking his HR more frequently over the weekend by using a BP cuff and he has consistently gotten rates in the 30's.  He's has not had any chest pain or dyspnea at rest and further denies presyncope or syncope.  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  Because of ongoing bradycardia, he called our office today and was advised to present to the ED for evaluation.  Here, he has been found to be in sinus rhythm with ventricular bigeminy.  He is currently asymptomatic.  Bedside echo shows nl LV though inf wall was not well visualized.    Home Medications  Prior to Admission medications   Medication Sig Start Date End Date Taking? Authorizing Provider  aspirin EC 81 MG tablet Take 81 mg by mouth daily.   Yes Historical Provider, MD  diltiazem (TIAZAC) 420 MG 24 hr capsule Take 420 mg by mouth daily.   Yes Historical Provider, MD  lisinopril (PRINIVIL,ZESTRIL) 10 MG tablet Take 10 mg by mouth daily.   Yes Historical Provider, MD  ondansetron (ZOFRAN ODT) 4 MG disintegrating tablet Take 1 tablet (4 mg total) by mouth every 8 (eight) hours as needed for nausea. Patient not taking: Reported on 12/21/2014 07/04/13   Larene Pickett, PA-C    Family History  Family History  Problem Relation Age of Onset  . Heart attack Father     deceased @ 92.  . Congestive Heart Failure Sister     alive in her late 53's.  . Other Brother  alive @ 26.  Marland Kitchen CAD Mother     H/o CABG, deceased @ 43.    Social History  History   Social History  . Marital Status: Single    Spouse Name: N/A  . Number of Children: N/A  . Years of Education: N/A   Occupational History  . Not on file.   Social History Main Topics  . Smoking status: Former Research scientist (life sciences)  . Smokeless tobacco: Not on file     Comment: Smoked for a few yrs in the service.  Quit in 1968. Never a heavy smoker.  . Alcohol Use: No  . Drug Use: No  . Sexual Activity: Not on file   Other Topics Concern  . Not on file   Social History Narrative   Lives between [place] and [place] by himself.  He does not routinely exercise.     Review of Systems General:  Generalized malaise all winter long.  No chills, fever, night sweats or weight changes.  Cardiovascular:  No chest pain, +++ dyspnea  on exertion, no edema, orthopnea, palpitations, paroxysmal nocturnal dyspnea. Dermatological: No rash, lesions/masses Respiratory: No cough, +++ dyspnea Urologic: No hematuria, dysuria Abdominal:   No nausea, vomiting, diarrhea, bright red blood per rectum, melena, or hematemesis Neurologic:  Chronic dizziness.  No visual changes, wkns, changes in mental status. All other systems reviewed and are otherwise negative except as noted above.  Physical Exam  Blood pressure 150/60, pulse 30, temperature 98.4 F (36.9 C), temperature source Oral, resp. rate 14, weight 252 lb 4 oz (114.42 kg), SpO2 96 %.  General: Pleasant, NAD Psych: Normal affect. Neuro: Alert and oriented X 3. Moves all extremities spontaneously. HEENT: Normal  Neck: Supple without bruits or JVD. Lungs:  Resp regular and unlabored, CTA. Heart: Irreg, no s3, s4, 2/6 SEM heard throughout. Abdomen: Soft, non-tender, non-distended, BS + x 4.  Extremities: No clubbing, cyanosis or edema. DP/PT/Radials 2+ and equal bilaterally.  Labs  Troponin [facility] of Care Test)  Recent Labs  12/21/14 1213  TROPIPOC 0.01   Lab Results  Component Value Date   WBC 8.3 12/21/2014   HGB 13.9 12/21/2014   HCT 41.1 12/21/2014   MCV 86.5 12/21/2014   PLT 206 12/21/2014     Recent Labs Lab 12/21/14 1155  NA 141  K 4.2  CL 107  CO2 23  BUN 23*  CREATININE 1.42*  CALCIUM 10.0  GLUCOSE 102*    Radiology/Studies  Dg Chest 2 View  12/21/2014   CLINICAL DATA:  Bradycardia, history hypertension  EXAM: CHEST  2 VIEW  COMPARISON:  07/04/2013  FINDINGS: Enlargement of cardiac silhouette.  Atherosclerotic calcification aorta.  Mediastinal contours and pulmonary vascularity normal.  Lungs clear.  No pleural effusion or pneumothorax.  Old RIGHT rib fractures.  No acute osseous findings.  Question diffuse idiopathic skeletal hyperostosis.  IMPRESSION: Enlargement of cardiac silhouette.  No acute abnormalities.   Electronically Signed   By:  Lavonia Dana M.D.   On: 12/21/2014 12:34   ECG  Rsr, 67, ventricular bigeminy.  ASSESSMENT AND PLAN  1.  Ventricular Bigeminy:  Pt presented today due to ongoing concern related to low HR's found on his home BP cuff (readings in the 30's).  He is in sinus rhythm with ventricular bigeminy.  He is asymptomatic @ rest, though reports a h/o generalized malaise and low energy over the winter with poor exercise tolerance when he tried to walk last week.  Low hr's were first noted last Friday and  he wouldn't know if it was present prior to then.  Plan to admit.  Check Mg (K ok), TSH, formal 2d echo to better evaluate valves and inferior wall as this was not well visualized on bedside echo.  If inferior wma present, would plan on cath.  If nl  Myoview.  Cont home dose of dilt.  2.  Hypertension:  Stable.  Cont dilt/lisinopril.  3.  Systolic Murmur:  F/U echo as above.  4.  ? Snoring: outpt sleep eval.  5.  CKD III:  Stable. Follow.  Signed, Murray Hodgkins, NP 12/21/2014, 3:32 PM  Patient seen and examined independently. Emeline Gins, NP note reviewed carefully - agree with his assessment and plan. I have edited the note based on my findings.   Patient without cardiac history presents with several month h/o progressive fatigue. Now found to have ventricular bigeminy. Bedside echo shows normal RV and LV function. I could not see inferior wall well but I am concerned about inferior HK. Will admit to tele. Get formal echo. If inferior wall HK will need cath. If ok can start with myoview. Suspect OSA may be contributing. If PVCs persist may need anti-arrhythmic therapy vs ablation.   Eain Mullendore,MD 3:57 PM

## 2014-12-21 NOTE — ED Provider Notes (Signed)
CSN: [id number]     Arrival date & time 12/21/14  1131 History   First MD Initiated Contact with Patient 12/21/14 1142     Chief Complaint  Patient presents with  . Chest Pain     (Consider location/radiation/quality/duration/timing/severity/associated sxs/prior Treatment) HPI   Clinton Holmes is a(n) 81 y.o. male who presents to the ED with cc of bradycardia and fatigue. The patient states that he has been feeling "worthless and weak." he went to his PCP yesterday and was noted to have a HR in the forties. Today the patiet was at home and checked his HR and found it to be 32. He came to the hospital for further evaluation. He states that he has had a recent medication change where his Lisinopril dosage was halved but denies any new medications. Denies DOE, SOB, chest pain (as indicated in the nursing notes) tightness or pressure, radiation to left arm, jaw or back, nausea, or diaphoresis. Denies fevers, chills, myalgias, arthralgias.Denies dysuria, flank pain, suprapubic pain, frequency, urgency, or hematuria. Denies headaches, light headedness, weakness, visual disturbances. Denies abdominal pain, nausea, vomiting, diarrhea or constipation.    Past Medical History  Diagnosis Date  . Hypertension    Past Surgical History  Procedure Laterality Date  . Knee surgery     History reviewed. No pertinent family history. History  Substance Use Topics  . Smoking status: Never Smoker   . Smokeless tobacco: Not on file  . Alcohol Use: No    Review of Systems  Ten systems reviewed and are negative for acute change, except as noted in the HPI.    Allergies  Review of patient's allergies indicates no known allergies.  Home Medications   Prior to Admission medications   Medication Sig Start Date End Date Taking? Authorizing Provider  diltiazem (TIAZAC) 420 MG 24 hr capsule Take 420 mg by mouth daily.    Historical Provider, MD  lisinopril (PRINIVIL,ZESTRIL) 10 MG tablet Take 10 mg  by mouth daily.    Historical Provider, MD  ondansetron (ZOFRAN ODT) 4 MG disintegrating tablet Take 1 tablet (4 mg total) by mouth every 8 (eight) hours as needed for nausea. 07/04/13   Larene Pickett, PA-C   BP 184/88 mmHg  Pulse 37  Temp(Src) 98.4 F (36.9 C) (Oral)  Wt 252 lb 4 oz (114.42 kg)  SpO2 97% Physical Exam  Constitutional: He is oriented to person, place, and time. He appears well-developed and well-nourished. No distress.  HENT:  Head: Normocephalic and atraumatic.  Eyes: Conjunctivae are normal. No scleral icterus.  Neck: Normal range of motion. Neck supple.  Cardiovascular: Normal rate, regular rhythm and normal heart sounds.   Pulmonary/Chest: Effort normal and breath sounds normal. No respiratory distress.  Abdominal: Soft. There is no tenderness.  Musculoskeletal: He exhibits no edema.  Neurological: He is alert and oriented to person, place, and time.  Skin: Skin is warm and dry. He is not diaphoretic.  Psychiatric: His behavior is normal.  Nursing note and vitals reviewed.   ED Course  Procedures (including critical care time) Labs Review Labs Reviewed  [place], ED    Imaging Review No results found.   EKG Interpretation   Date/Time:  Tuesday Dec 21 2014 11:36:06 EDT Ventricular Rate:  67 PR Interval:  166 QRS Duration: 104 QT Interval:  392 QTC Calculation: 414 R Axis:   51 Text Interpretation:  Sinus rhythm with frequent Premature ventricular  complexes  in a pattern of bigeminy Otherwise normal ECG bigeminy new  compared to 2014 Confirmed by GOLDSTON  MD, [place] ([id number]) on 12/21/2014  12:44:40 PM      MDM   Final diagnoses:  Essential hypertension  CKD (chronic kidney disease), stage III  Other secondary osteoarthritis of both knees    Patient with bradycardia weakness. EKG shows bigeminy Patient with elevated serum creatinine, which appears stable and at baseline for  the patient. Troponin is negative. Otherwise unremarkable. Patient currently awaiting cardiology consult. I have given the patient to oncoming physicians in sign out. He appears stable for Care handoff at this time.    Margarita Mail, PA-C 12/24/14 [place], MD 12/26/14 [phone]

## 2014-12-21 NOTE — ED Notes (Signed)
Pt returned from xray

## 2014-12-22 ENCOUNTER — Inpatient Hospital Stay (HOSPITAL_COMMUNITY): Payer: Medicare Other

## 2014-12-22 DIAGNOSIS — R0600 Dyspnea, unspecified: Secondary | ICD-10-CM

## 2014-12-22 DIAGNOSIS — N183 Chronic kidney disease, stage 3 (moderate): Secondary | ICD-10-CM

## 2014-12-22 LAB — COMPREHENSIVE METABOLIC PANEL
ALT: 14 U/L — ABNORMAL LOW (ref 17–63)
AST: 18 U/L (ref 15–41)
Albumin: 3.5 g/dL (ref 3.5–5.0)
Alkaline Phosphatase: 72 U/L (ref 38–126)
Anion gap: 8 (ref 5–15)
BUN: 23 mg/dL — ABNORMAL HIGH (ref 6–20)
CO2: 25 mmol/L (ref 22–32)
CREATININE: 1.44 mg/dL — AB (ref 0.61–1.24)
Calcium: 9.5 mg/dL (ref 8.9–10.3)
Chloride: 108 mmol/L (ref 101–111)
GFR, EST AFRICAN AMERICAN: 50 mL/min — AB (ref >60)
GFR, EST NON AFRICAN AMERICAN: 43 mL/min — AB (ref >60)
GLUCOSE: 104 mg/dL — AB (ref 70–99)
Potassium: 4.1 mmol/L (ref 3.5–5.1)
Sodium: 141 mmol/L (ref 135–145)
Total Bilirubin: 0.7 mg/dL (ref 0.3–1.2)
Total Protein: 6 g/dL — ABNORMAL LOW (ref 6.5–8.1)

## 2014-12-22 LAB — LIPID PANEL
CHOL/HDL RATIO: 5.4 ratio
Cholesterol: 150 mg/dL (ref 0–200)
HDL: 28 mg/dL — AB (ref >40)
LDL CALC: 107 mg/dL — AB (ref 0–99)
Triglycerides: 74 mg/dL (ref <150)
VLDL: 15 mg/dL (ref 0–40)

## 2014-12-22 LAB — TROPONIN I: Troponin I: <0.03 ng/mL (ref <0.031)

## 2014-12-22 MED ORDER — ASPIRIN 81 MG PO CHEW
81.0000 mg | CHEWABLE_TABLET | ORAL | Status: AC
  Administered 2014-12-23: 81 mg via ORAL
  Filled 2014-12-22: qty 1

## 2014-12-22 MED ORDER — SODIUM CHLORIDE 0.9 % WEIGHT BASED INFUSION
1.0000 mL/kg/h | INTRAVENOUS | Status: DC
  Administered 2014-12-22 – 2014-12-23 (×2): 1 mL/kg/h via INTRAVENOUS

## 2014-12-22 MED ORDER — SODIUM CHLORIDE 0.9 % IJ SOLN
3.0000 mL | Freq: Two times a day (BID) | INTRAMUSCULAR | Status: DC
  Administered 2014-12-22 – 2014-12-23 (×2): 3 mL via INTRAVENOUS

## 2014-12-22 MED ORDER — SODIUM CHLORIDE 0.9 % IV SOLN: 250.0000 mL | INTRAVENOUS | Status: DC | PRN

## 2014-12-22 MED ORDER — SODIUM CHLORIDE 0.9 % IJ SOLN: 3.0000 mL | INTRAMUSCULAR | Status: DC | PRN

## 2014-12-22 NOTE — Progress Notes (Signed)
*  PRELIMINARY RESULTS* Echocardiogram 2D Echocardiogram has been performed.  Leavy Cella 12/22/2014, 10:07 AM

## 2014-12-22 NOTE — Progress Notes (Signed)
Utilization review completed.  

## 2014-12-22 NOTE — Progress Notes (Signed)
Subjective:  Has been getting tired, SOB over past several months.   Objective:  Vital Signs in the last 24 hours: Temp:  [97.3 F (36.3 C)-98.4 F (36.9 C)] 98.4 F (36.9 C) (05/11 1041) Pulse Rate:  [30-64] 60 (05/11 1041) Resp:  [12-19] 18 (05/11 1041) BP: (135-172)/(51-76) 172/62 mmHg (05/11 1041) SpO2:  [92 %-98 %] 96 % (05/11 1041) Weight:  [247 lb 9.2 oz (112.3 kg)-249 lb 11.2 oz (113.263 kg)] 247 lb 9.2 oz (112.3 kg) (05/11 0500)  Intake/Output from previous day: 05/10 0701 - 05/11 0700 In: 243 [P.O.:240; I.V.:3] Out: -    Physical Exam: General: Well developed, well nourished, in no acute distress. Looks younger than age Head:  Normocephalic and atraumatic. Lungs: Clear to auscultation and percussion. Heart: Normal S1 and S2 with frequent ectopy. 3/6 SEM RUSB, no rubs or gallops.  Abdomen: soft, non-tender, positive bowel sounds. Obese Extremities: No clubbing or cyanosis. Trace edema. Neurologic: Alert and oriented x 3.    Lab Results:  Recent Labs  12/21/14 1155 12/21/14 2045  WBC 8.3 7.2  HGB 13.9 13.6  PLT 206 198    Recent Labs  12/21/14 1155 12/21/14 2045 12/22/14 0704  NA 141  --  141  K 4.2  --  4.1  CL 107  --  108  CO2 23  --  25  GLUCOSE 102*  --  104*  BUN 23*  --  23*  CREATININE 1.42* 1.53* 1.44*    Recent Labs  12/22/14 0038 12/22/14 0704  TROPONINI <0.03 <0.03   Hepatic Function Panel  Recent Labs  12/22/14 0704  PROT 6.0*  ALBUMIN 3.5  AST 18  ALT 14*  ALKPHOS 72  BILITOT 0.7    Recent Labs  12/22/14 0704  CHOL 150   No results for input(s): PROTIME in the last 72 hours.  Imaging: Dg Chest 2 View  12/21/2014   CLINICAL DATA:  Bradycardia, history hypertension  EXAM: CHEST  2 VIEW  COMPARISON:  07/04/2013  FINDINGS: Enlargement of cardiac silhouette.  Atherosclerotic calcification aorta.  Mediastinal contours and pulmonary vascularity normal.  Lungs clear.  No pleural effusion or pneumothorax.  Old  RIGHT rib fractures.  No acute osseous findings.  Question diffuse idiopathic skeletal hyperostosis.  IMPRESSION: Enlargement of cardiac silhouette.  No acute abnormalities.   Electronically Signed   By: Lavonia Dana M.D.   On: 12/21/2014 12:34   Personally viewed.   Telemetry: Frequent PVC, bigem Personally viewed.  Cardiac Studies:  ECHO - prelim, at least moderate AS (3.41m/s) may be worse (off angle CW Doppler). Normal EF.   Scheduled Meds: . aspirin EC  81 mg Oral Daily  . diltiazem  240 mg Oral Daily  . heparin  5,000 Units Subcutaneous 3 times per day  . lisinopril  10 mg Oral Daily  . sodium chloride  3 mL Intravenous Q12H   Continuous Infusions:  PRN Meds:.sodium chloride, acetaminophen, nitroGLYCERIN, ondansetron (ZOFRAN) IV, sodium chloride  Assessment/Plan:  Active Problems:   Ventricular bigeminy  81 year old with newly discovered aortic stenosis (moderate, possible severe), worsening dyspnea/fatigue, CKD 3, obesity, frequent PVC's.  Plan:  - Left and right heart cath tomorrow AM  - Please cross aortic valve if possible  - If AS severe - surgery consult, If moderate AS with severe CAD - surgery consult. If no CAD and moderate AS, monitor. If no CAD and severe AS - consider TAVR (advanced age).  - Will hydrate overnight.   -  Will hold lisinopril 10mg  (ACE-I)  - Reviewed risks and benefits of cath (stroke, MI death, bleeding, renal impairment). Daughter in room. Willing to proceed.   PVC's - could consider amiodarone to suppress PVC's and possibly increase overall effective heart rate.    Veroncia Jezek, [place] 12/22/2014, 11:49 AM

## 2014-12-23 ENCOUNTER — Encounter (HOSPITAL_COMMUNITY)
Admission: EM | Disposition: A | Discharge status: Home / Self Care | Payer: Medicare Other | Source: Home / Self Care | Attending: Internal Medicine

## 2014-12-23 DIAGNOSIS — I350 Nonrheumatic aortic (valve) stenosis: Secondary | ICD-10-CM

## 2014-12-23 HISTORY — PX: CARDIAC CATHETERIZATION: SHX172

## 2014-12-23 LAB — POCT I-STAT 3, VENOUS BLOOD GAS (G3P V)
Acid-base deficit: 3 mmol/L — ABNORMAL HIGH (ref 0.0–2.0)
BICARBONATE: 23.4 meq/L (ref 20.0–24.0)
O2 Saturation: 61 %
PCO2 VEN: 45 mmHg (ref 45.0–50.0)
PH VEN: 7.323 — AB (ref 7.250–7.300)
PO2 VEN: 34 mmHg (ref 30.0–45.0)
TCO2: 25 mmol/L (ref 0–100)

## 2014-12-23 LAB — CREATININE, SERUM
Creatinine, Ser: 1.35 mg/dL — ABNORMAL HIGH (ref 0.61–1.24)
GFR calc non Af Amer: 46 mL/min — ABNORMAL LOW (ref >60)
GFR, EST AFRICAN AMERICAN: 54 mL/min — AB (ref >60)

## 2014-12-23 LAB — POCT I-STAT 3, ART BLOOD GAS (G3+)
Acid-base deficit: 2 mmol/L (ref 0.0–2.0)
Bicarbonate: 23.4 mEq/L (ref 20.0–24.0)
O2 SAT: 98 %
PO2 ART: 110 mmHg — AB (ref 80.0–100.0)
TCO2: 25 mmol/L (ref 0–100)
pCO2 arterial: 43.1 mmHg (ref 35.0–45.0)
pH, Arterial: 7.342 — ABNORMAL LOW (ref 7.350–7.450)

## 2014-12-23 LAB — CBC
HEMATOCRIT: 40.3 % (ref 39.0–52.0)
HEMOGLOBIN: 13.3 g/dL (ref 13.0–17.0)
MCH: 28.9 pg (ref 26.0–34.0)
MCHC: 33 g/dL (ref 30.0–36.0)
MCV: 87.4 fL (ref 78.0–100.0)
Platelets: 191 10*3/uL (ref 150–400)
RBC: 4.61 MIL/uL (ref 4.22–5.81)
RDW: 14.2 % (ref 11.5–15.5)
WBC: 6.5 10*3/uL (ref 4.0–10.5)

## 2014-12-23 LAB — PROTIME-INR
INR: 1.13 (ref 0.00–1.49)
PROTHROMBIN TIME: 14.6 s (ref 11.6–15.2)

## 2014-12-23 SURGERY — RIGHT/LEFT HEART CATH AND CORONARY ANGIOGRAPHY
Anesthesia: LOCAL

## 2014-12-23 MED ORDER — FENTANYL CITRATE (PF) 100 MCG/2ML IJ SOLN
INTRAMUSCULAR | Status: AC
Start: 2014-12-23 — End: 2014-12-23
  Filled 2014-12-23: qty 2

## 2014-12-23 MED ORDER — SODIUM CHLORIDE 0.9 % WEIGHT BASED INFUSION: 1.0000 mL/kg/h | INTRAVENOUS | Status: AC

## 2014-12-23 MED ORDER — HYDRALAZINE HCL 20 MG/ML IJ SOLN
INTRAMUSCULAR | Status: DC | PRN
  Administered 2014-12-23: 10 mg via INTRAVENOUS

## 2014-12-23 MED ORDER — SODIUM CHLORIDE 0.9 % IJ SOLN
3.0000 mL | Freq: Two times a day (BID) | INTRAMUSCULAR | Status: DC
  Administered 2014-12-23 – 2014-12-24 (×2): 3 mL via INTRAVENOUS

## 2014-12-23 MED ORDER — MIDAZOLAM HCL 2 MG/2ML IJ SOLN
INTRAMUSCULAR | Status: DC | PRN
  Administered 2014-12-23 (×2): 1 mg via INTRAVENOUS

## 2014-12-23 MED ORDER — HEPARIN (PORCINE) IN NACL 2-0.9 UNIT/ML-% IJ SOLN
INTRAMUSCULAR | Status: AC
Start: 2014-12-23 — End: 2014-12-23
  Filled 2014-12-23: qty 500

## 2014-12-23 MED ORDER — ACETAMINOPHEN 325 MG PO TABS: 650.0000 mg | ORAL_TABLET | ORAL | Status: DC | PRN

## 2014-12-23 MED ORDER — ALPRAZOLAM 0.25 MG PO TABS
0.2500 mg | ORAL_TABLET | Freq: Every day | ORAL | Status: DC | PRN
Start: 2014-12-23 — End: 2014-12-24
  Administered 2014-12-23: 0.25 mg via ORAL
  Filled 2014-12-23: qty 1

## 2014-12-23 MED ORDER — FENTANYL CITRATE (PF) 100 MCG/2ML IJ SOLN
INTRAMUSCULAR | Status: DC | PRN
  Administered 2014-12-23 (×2): 50 ug via INTRAVENOUS

## 2014-12-23 MED ORDER — SODIUM CHLORIDE 0.9 % IV SOLN: 250.0000 mL | INTRAVENOUS | Status: DC | PRN

## 2014-12-23 MED ORDER — AMIODARONE HCL 150 MG/3ML IV SOLN
INTRAVENOUS | Status: DC | PRN
  Administered 2014-12-23: 150 mg via INTRAVENOUS

## 2014-12-23 MED ORDER — HEPARIN (PORCINE) IN NACL 2-0.9 UNIT/ML-% IJ SOLN
INTRAMUSCULAR | Status: AC
  Filled 2014-12-23: qty 1000

## 2014-12-23 MED ORDER — SODIUM CHLORIDE 0.9 % IJ SOLN: 3.0000 mL | INTRAMUSCULAR | Status: DC | PRN

## 2014-12-23 MED ORDER — OXYCODONE-ACETAMINOPHEN 5-325 MG PO TABS: 1.0000 | ORAL_TABLET | ORAL | Status: DC | PRN

## 2014-12-23 MED ORDER — IOHEXOL 350 MG/ML SOLN
INTRAVENOUS | Status: DC | PRN
  Administered 2014-12-23: 85 mL via INTRA_ARTERIAL

## 2014-12-23 MED ORDER — AMIODARONE HCL 150 MG/3ML IV SOLN
INTRAVENOUS | Status: AC
  Filled 2014-12-23: qty 3

## 2014-12-23 MED ORDER — MIDAZOLAM HCL 2 MG/2ML IJ SOLN
INTRAMUSCULAR | Status: AC
  Filled 2014-12-23: qty 2

## 2014-12-23 MED ORDER — HEPARIN SODIUM (PORCINE) 5000 UNIT/ML IJ SOLN
5000.0000 [IU] | Freq: Three times a day (TID) | INTRAMUSCULAR | Status: DC
  Administered 2014-12-24: 5000 [IU] via SUBCUTANEOUS
  Filled 2014-12-23 (×3): qty 1

## 2014-12-23 MED ORDER — NITROGLYCERIN 1 MG/10 ML FOR IR/CATH LAB
INTRA_ARTERIAL | Status: AC
  Filled 2014-12-23: qty 10

## 2014-12-23 MED ORDER — HYDRALAZINE HCL 20 MG/ML IJ SOLN
INTRAMUSCULAR | Status: AC
  Filled 2014-12-23: qty 1

## 2014-12-23 MED ORDER — LIDOCAINE HCL (PF) 1 % IJ SOLN
INTRAMUSCULAR | Status: AC
  Filled 2014-12-23: qty 30

## 2014-12-23 SURGICAL SUPPLY — 15 items
CATH INFINITI 5FR JL4 (CATHETERS) ×2 IMPLANT
CATH INFINITI 5FR JL5 (CATHETERS) ×2 IMPLANT
CATH INFINITI JR4 5F (CATHETERS) ×2 IMPLANT
CATH SITESEER 5F MULTI A 2 (CATHETERS) ×2 IMPLANT
CATH SWAN GANZ 7F STRAIGHT (CATHETERS) ×2 IMPLANT
DEVICE CLOSURE PERCLS PRGLD 6F (VASCULAR PRODUCTS) ×1 IMPLANT
KIT HEART LEFT (KITS) ×4 IMPLANT
KIT HEART RIGHT NAMIC (KITS) ×2 IMPLANT
PACK CARDIAC CATHETERIZATION (CUSTOM PROCEDURE TRAY) ×2 IMPLANT
PERCLOSE PROGLIDE 6F (VASCULAR PRODUCTS) ×2
SHEATH PINNACLE 6F 10CM (SHEATH) ×2 IMPLANT
SHEATH PINNACLE 7F 10CM (SHEATH) ×2 IMPLANT
TRANSDUCER W/STOPCOCK (MISCELLANEOUS) ×2 IMPLANT
WIRE EMERALD 3MM-J .035X150CM (WIRE) ×2 IMPLANT
WIRE EMERALD ST .035X150CM (WIRE) ×2 IMPLANT

## 2014-12-23 NOTE — Interval H&P Note (Signed)
Cath Lab Visit (complete for each Cath Lab visit)  Clinical Evaluation Leading to the Procedure:   ACS: No.  Non-ACS:    Anginal Classification: CCS II  Anti-ischemic medical therapy: Minimal Therapy (1 class of medications)  Non-Invasive Test Results: No non-invasive testing performed  Prior CABG: No previous CABG      History and Physical Interval Note:  12/23/2014 2:28 PM  Clinton Holmes  has presented today for surgery, with the diagnosis of aortic stenosis,dyspnea  The various methods of treatment have been discussed with the patient and family. After consideration of risks, benefits and other options for treatment, the patient has consented to  Procedure(s): Right/Left Heart Cath and Coronary Angiography (N/A) as a surgical intervention .  The patient's history has been reviewed, patient examined, no change in status, stable for surgery.  I have reviewed the patient's chart and labs.  Questions were answered to the patient's satisfaction.     Sinclair Grooms

## 2014-12-23 NOTE — Progress Notes (Signed)
Subjective:  Has been getting tired, SOB over past several months.  Last night OK. No dizzy.  Objective:  Vital Signs in the last 24 hours: Temp:  [97.6 F (36.4 C)-98.4 F (36.9 C)] 97.8 F (36.6 C) (05/12 0419) Pulse Rate:  [54-94] 57 (05/12 0419) Resp:  [18] 18 (05/12 0419) BP: (138-172)/(60-97) 144/97 mmHg (05/12 0419) SpO2:  [94 %-98 %] 96 % (05/12 0419) Weight:  [249 lb 5.4 oz (113.1 kg)] 249 lb 5.4 oz (113.1 kg) (05/12 0419)  Intake/Output from previous day: 05/11 0701 - 05/12 0700 In: 720 [P.O.:720] Out: -    Physical Exam: General: Well developed, well nourished, in no acute distress. Looks younger than age Head:  Normocephalic and atraumatic. Lungs: Clear to auscultation and percussion. Heart: Normal S1 and S2 with frequent ectopy. 3/6 SEM RUSB, no rubs or gallops.  Abdomen: soft, non-tender, positive bowel sounds. Obese Extremities: No clubbing or cyanosis. Trace edema. Neurologic: Alert and oriented x 3.    Lab Results:  Recent Labs  12/21/14 1155 12/21/14 2045  WBC 8.3 7.2  HGB 13.9 13.6  PLT 206 198    Recent Labs  12/21/14 1155 12/21/14 2045 12/22/14 0704  NA 141  --  141  K 4.2  --  4.1  CL 107  --  108  CO2 23  --  25  GLUCOSE 102*  --  104*  BUN 23*  --  23*  CREATININE 1.42* 1.53* 1.44*    Recent Labs  12/22/14 0038 12/22/14 0704  TROPONINI <0.03 <0.03   Hepatic Function Panel  Recent Labs  12/22/14 0704  PROT 6.0*  ALBUMIN 3.5  AST 18  ALT 14*  ALKPHOS 72  BILITOT 0.7    Recent Labs  12/22/14 0704  CHOL 150   No results for input(s): PROTIME in the last 72 hours.  Imaging: Dg Chest 2 View  12/21/2014   CLINICAL DATA:  Bradycardia, history hypertension  EXAM: CHEST  2 VIEW  COMPARISON:  07/04/2013  FINDINGS: Enlargement of cardiac silhouette.  Atherosclerotic calcification aorta.  Mediastinal contours and pulmonary vascularity normal.  Lungs clear.  No pleural effusion or pneumothorax.  Old RIGHT rib  fractures.  No acute osseous findings.  Question diffuse idiopathic skeletal hyperostosis.  IMPRESSION: Enlargement of cardiac silhouette.  No acute abnormalities.   Electronically Signed   By: Lavonia Dana M.D.   On: 12/21/2014 12:34   Personally viewed.   Telemetry: Frequent PVC, bigem Personally viewed. Occas couplet  Cardiac Studies:  ECHO - prelim, at least moderate AS (3.36m/s) may be worse (off angle CW Doppler). Normal EF.   Scheduled Meds: . aspirin EC  81 mg Oral Daily  . diltiazem  240 mg Oral Daily  . heparin  5,000 Units Subcutaneous 3 times per day  . sodium chloride  3 mL Intravenous Q12H  . sodium chloride  3 mL Intravenous Q12H   Continuous Infusions: . sodium chloride 1 mL/kg/hr (12/23/14 0630)   PRN Meds:.sodium chloride, sodium chloride, acetaminophen, nitroGLYCERIN, ondansetron (ZOFRAN) IV, sodium chloride, sodium chloride  Assessment/Plan:  Active Problems:   Ventricular bigeminy  81 year old with newly discovered aortic stenosis (moderate, possible severe), worsening dyspnea/fatigue, CKD 3, obesity, frequent PVC's.  Plan:  - Left and right heart cath today 130pm  - Please cross aortic valve if possible  - If AS severe - surgery consult, If moderate AS with severe CAD - surgery consult. If no CAD and moderate AS, monitor. If no CAD and severe  AS - consider TAVR (advanced age).  - Continue hydrate   - holding lisinopril 10mg  (ACE-I)  - Reviewed risks and benefits of cath (stroke, MI death, bleeding, renal impairment). Daughter in room. Willing to proceed.   PVC's - could consider amiodarone to suppress PVC's and possibly increase overall effective heart rate.    Itai Barbian, [place] 12/23/2014, 9:00 AM

## 2014-12-23 NOTE — H&P (View-Only) (Signed)
Subjective:  Has been getting tired, SOB over past several months.  Last night OK. No dizzy.  Objective:  Vital Signs in the last 24 hours: Temp:  [97.6 F (36.4 C)-98.4 F (36.9 C)] 97.8 F (36.6 C) (05/12 0419) Pulse Rate:  [54-94] 57 (05/12 0419) Resp:  [18] 18 (05/12 0419) BP: (138-172)/(60-97) 144/97 mmHg (05/12 0419) SpO2:  [94 %-98 %] 96 % (05/12 0419) Weight:  [249 lb 5.4 oz (113.1 kg)] 249 lb 5.4 oz (113.1 kg) (05/12 0419)  Intake/Output from previous day: 05/11 0701 - 05/12 0700 In: 720 [P.O.:720] Out: -    Physical Exam: General: Well developed, well nourished, in no acute distress. Looks younger than age Head:  Normocephalic and atraumatic. Lungs: Clear to auscultation and percussion. Heart: Normal S1 and S2 with frequent ectopy. 3/6 SEM RUSB, no rubs or gallops.  Abdomen: soft, non-tender, positive bowel sounds. Obese Extremities: No clubbing or cyanosis. Trace edema. Neurologic: Alert and oriented x 3.    Lab Results:  Recent Labs  12/21/14 1155 12/21/14 2045  WBC 8.3 7.2  HGB 13.9 13.6  PLT 206 198    Recent Labs  12/21/14 1155 12/21/14 2045 12/22/14 0704  NA 141  --  141  K 4.2  --  4.1  CL 107  --  108  CO2 23  --  25  GLUCOSE 102*  --  104*  BUN 23*  --  23*  CREATININE 1.42* 1.53* 1.44*    Recent Labs  12/22/14 0038 12/22/14 0704  TROPONINI <0.03 <0.03   Hepatic Function Panel  Recent Labs  12/22/14 0704  PROT 6.0*  ALBUMIN 3.5  AST 18  ALT 14*  ALKPHOS 72  BILITOT 0.7    Recent Labs  12/22/14 0704  CHOL 150   No results for input(s): PROTIME in the last 72 hours.  Imaging: Dg Chest 2 View  12/21/2014   CLINICAL DATA:  Bradycardia, history hypertension  EXAM: CHEST  2 VIEW  COMPARISON:  07/04/2013  FINDINGS: Enlargement of cardiac silhouette.  Atherosclerotic calcification aorta.  Mediastinal contours and pulmonary vascularity normal.  Lungs clear.  No pleural effusion or pneumothorax.  Old RIGHT rib  fractures.  No acute osseous findings.  Question diffuse idiopathic skeletal hyperostosis.  IMPRESSION: Enlargement of cardiac silhouette.  No acute abnormalities.   Electronically Signed   By: Lavonia Dana M.D.   On: 12/21/2014 12:34   Personally viewed.   Telemetry: Frequent PVC, bigem Personally viewed. Occas couplet  Cardiac Studies:  ECHO - prelim, at least moderate AS (3.21m/s) may be worse (off angle CW Doppler). Normal EF.   Scheduled Meds: . aspirin EC  81 mg Oral Daily  . diltiazem  240 mg Oral Daily  . heparin  5,000 Units Subcutaneous 3 times per day  . sodium chloride  3 mL Intravenous Q12H  . sodium chloride  3 mL Intravenous Q12H   Continuous Infusions: . sodium chloride 1 mL/kg/hr (12/23/14 0630)   PRN Meds:.sodium chloride, sodium chloride, acetaminophen, nitroGLYCERIN, ondansetron (ZOFRAN) IV, sodium chloride, sodium chloride  Assessment/Plan:  Active Problems:   Ventricular bigeminy  81 year old with newly discovered aortic stenosis (moderate, possible severe), worsening dyspnea/fatigue, CKD 3, obesity, frequent PVC's.  Plan:  - Left and right heart cath today 130pm  - Please cross aortic valve if possible  - If AS severe - surgery consult, If moderate AS with severe CAD - surgery consult. If no CAD and moderate AS, monitor. If no CAD and severe  AS - consider TAVR (advanced age).  - Continue hydrate   - holding lisinopril 10mg  (ACE-I)  - Reviewed risks and benefits of cath (stroke, MI death, bleeding, renal impairment). Daughter in room. Willing to proceed.   PVC's - could consider amiodarone to suppress PVC's and possibly increase overall effective heart rate.    Clinton Holmes, [place] 12/23/2014, 9:00 AM

## 2014-12-23 NOTE — Progress Notes (Signed)
RN notified Dr. Marlou Porch regarding pt's elevated BP and decreased HR (30s).  RN advised by MD to continue to monitor BP and to notify night time MD for systolic BP greater than 998. RN will report this information to the on coming RN.

## 2014-12-24 ENCOUNTER — Encounter (HOSPITAL_COMMUNITY): Payer: Self-pay | Admitting: Interventional Cardiology

## 2014-12-24 DIAGNOSIS — I10 Essential (primary) hypertension: Secondary | ICD-10-CM

## 2014-12-24 MED ORDER — DILTIAZEM HCL ER COATED BEADS 120 MG PO CP24: 120.0000 mg | ORAL_CAPSULE | Freq: Every day | ORAL | Status: DC

## 2014-12-24 MED ORDER — AMIODARONE HCL 200 MG PO TABS: 200.0000 mg | ORAL_TABLET | Freq: Two times a day (BID) | ORAL | Status: DC

## 2014-12-24 MED ORDER — LISINOPRIL 10 MG PO TABS
10.0000 mg | ORAL_TABLET | Freq: Every day | ORAL | Status: DC
  Administered 2014-12-24: 10 mg via ORAL
  Filled 2014-12-24: qty 1

## 2014-12-24 MED ORDER — DILTIAZEM HCL ER COATED BEADS 120 MG PO CP24
120.0000 mg | ORAL_CAPSULE | Freq: Every day | ORAL | Status: DC
  Administered 2014-12-24: 120 mg via ORAL
  Filled 2014-12-24: qty 1

## 2014-12-24 MED ORDER — AMIODARONE HCL 200 MG PO TABS
200.0000 mg | ORAL_TABLET | Freq: Two times a day (BID) | ORAL | Status: DC
  Administered 2014-12-24: 200 mg via ORAL
  Filled 2014-12-24 (×2): qty 1

## 2014-12-24 MED FILL — Lidocaine HCl Local Preservative Free (PF) Inj 1%: INTRAMUSCULAR | Qty: 30 | Status: AC

## 2014-12-24 MED FILL — Heparin Sodium (Porcine) 2 Unit/ML in Sodium Chloride 0.9%: INTRAMUSCULAR | Qty: 1500 | Status: AC

## 2014-12-24 NOTE — Discharge Summary (Signed)
Discharge Summary   Patient ID: Clinton Holmes,  MRN: [id number], DOB/AGE: 79/19/1929 79 y.o.  Admit date: 12/21/2014 Discharge date: 12/24/2014  Primary Care Provider: [facility] Primary Cardiologist: Dr. Marlou Porch  Discharge Diagnoses Active Problems:   Essential hypertension   CKD (chronic kidney disease), stage III   Ventricular bigeminy   Aortic stenosis   Allergies No Known Allergies  Procedures  Echocardiogram 12/22/2014 LV EF: 55% -  60%  ------------------------------------------------------------------- Indications:   Dyspnea 786.09.  ------------------------------------------------------------------- History:  PMH: Ventricular Bigeminy. Murmur. No prior cardiac history. Risk factors: Hypertension.  ------------------------------------------------------------------- Study Conclusions  - Left ventricle: The cavity size was normal. There was moderate concentric hypertrophy. Systolic function was normal. The estimated ejection fraction was in the range of 55% to 60%. Wall motion was normal; there were no regional wall motion abnormalities. ncessant bigeminy limits evaluation of LV diastolic function. Doppler parameters are consistent with elevated mean left atrial filling pressure. - Aortic valve: There was moderate stenosis. There was trivial regurgitation. Valve area (VTI): 1.26 cm^2. Valve area (Vmax): 1.28 cm^2. Valve area (Vmean): 1.15 cm^2. - Mitral valve: There was mild to moderate regurgitation directed eccentrically and posteriorly. - Left atrium: The atrium was severely dilated. - Right ventricle: The cavity size was mildly dilated. Wall thickness was normal. - Pulmonary arteries: Systolic pressure was mildly increased. PA peak pressure: 39 mm Hg (S).     Cardiac catheterization 12/23/2014 Conclusion     Widely patent coronary arteries with 45% proximal LAD narrowing. The ramus intermedius branches and the  more medial/anterior branch contains a 60% stenosis. No hemodynamically significant coronary lesions were identified.  No evidence of aortic stenosis  Mild pulmonary hypertension  Apparent mild global left ventricular hypokinesis although left ventriculography was of poor quality due to faint opacification.   RECOMMENDATIONS:   No specific cardiac interventions are necessary based upon the coronary anatomy or aortic valve.    Coronary Findings    Dominance: Right   Left Main   . LM lesion, 45% stenosed. The lesion is type C, discrete, eccentric .     Ramus Intermedius   . Lateral Ramus Intermedius   . Lat Ramus lesion, 60% stenosed. The lesion is type C, eccentric .     Left Circumflex   . First Obtuse Marginal Branch   The vessel is small in size.   . Third Obtuse Marginal Branch   The vessel is small in size.       Right Heart Pressures Hemodynamic findings consistent with mild pulmonary hypertension. Elevated LV EDP consistent with volume overload.       Hospital Course  The patient is a 81 year old male with no prior cardiac history however he does have hypertension, stage III CKD and family history of CAD with his father dying at age 64 from [place] and his mother requiring CABG in her lifetime. He reports having a stress test in 2011 as part of preoperative evaluation prior to bilateral total knee arthroplasties, the result was reportedly normal. He has not seen by cardiologist since. He lead a relatively sedentary lifestyle and has very poor exercise tolerance. Last Friday, he went to his PCP for six-month checkup and noted to be bradycardic with heart rate in the 40s. Exam was reduced from 420 mg daily to 240 mg daily. He has been checking his heart rate more frequently over the weekend using a BP cuff and his heart rate has been going into the 30s. He denies any chest pain, dyspnea, presyncope or syncope.  His have chronic dizziness. The persistent bradycardia, he  contacted our cardiology office on 5/10 and was advised to go to [facility] ED for evaluation. His found to be in sinus rhythm with ventricular bigeminy however asymptomatic.  Given his progressive exertional dyspnea and fatigue, and ventricular bigeminy, a bedside echocardiogram was obtained in the ED which showed normal RV and LV function, however could not see inferior wall very well and was concerned about inferior hypokinesis. He was admitted to cardiology service. Echocardiogram obtained on 12/22/2014 showed EF 55-60%, no regional wall motion abnormality, moderate AS, mild to moderate MR, PA peak pressure 39. Given his symptom and valvular disease, it was eventually decided for him to undergo cardiac catheterization. He had left and right heart cath on 12/23/2014 which showed 45% prox LAD lesion, 60% lateral ramus lesion, otherwise no hemodynamically significant coronary lesion was identified. There was no evidence of aortic stenosis and no significant gradient on cath across AV. Mild pulmonary hypertension. Right heart cath showed mean pulmonary wedge pressure 14, LVEDP 21, cardiac output 4.55, cardiac index 1.94?Marland Kitchen   He was seen in the morning of 12/24/2014, given reassuring result, he is deemed stable for discharge. Dr. Marlou Porch has discussed with Dr. Lovena Le of EP. We have started amiodarone 200 mg twice a day for suppression of PVCs which can potentially increase overall effective heart rate. His diltiazem has been cut back to 120 mg CD. We'll consider outpatient low-dose Lasix at a later time, however will need to see how his blood pressures run while on amiodarone and restarting of lisinopril as outpatient. I will arrange 1 to 2 week follow-up with Dr. Marlou Porch' office.   Discharge Vitals Blood pressure 166/73, pulse 65, temperature 97.2 F (36.2 C), temperature source Oral, resp. rate 18, height 6' (1.829 m), weight 244 lb 7.8 oz (110.9 kg), SpO2 95 %.  Filed Weights   12/22/14 0500 12/23/14 0419  12/24/14 0520  Weight: 247 lb 9.2 oz (112.3 kg) 249 lb 5.4 oz (113.1 kg) 244 lb 7.8 oz (110.9 kg)    Labs  CBC  Recent Labs  12/21/14 2045 12/23/14 1640  WBC 7.2 6.5  HGB 13.6 13.3  HCT 40.2 40.3  MCV 86.8 87.4  PLT 198 734   Basic Metabolic Panel  Recent Labs  12/21/14 1155 12/21/14 2045 12/22/14 0704 12/23/14 1640  NA 141  --  141  --   K 4.2  --  4.1  --   CL 107  --  108  --   CO2 23  --  25  --   GLUCOSE 102*  --  104*  --   BUN 23*  --  23*  --   CREATININE 1.42* 1.53* 1.44* 1.35*  CALCIUM 10.0  --  9.5  --   MG  --  2.1  --   --    Liver Function Tests  Recent Labs  12/22/14 0704  AST 18  ALT 14*  ALKPHOS 72  BILITOT 0.7  PROT 6.0*  ALBUMIN 3.5   Cardiac Enzymes  Recent Labs  12/21/14 2045 12/22/14 0038 12/22/14 0704  TROPONINI <0.03 <0.03 <0.03   Fasting Lipid Panel  Recent Labs  12/22/14 0704  CHOL 150  HDL 28*  LDLCALC 107*  TRIG 74  CHOLHDL 5.4   Thyroid Function Tests  Recent Labs  12/21/14 2045  TSH 1.156    Disposition  Pt is being discharged home today in good condition.  Follow-up Plans & Appointments  Follow-up Information    Follow up with Candee Furbish, MD On 01/05/2015.   Specialty:  Cardiology   Why:  10:00am   Contact information:   [address] [place] [address] [phone]       Discharge Medications    Medication List    STOP taking these medications        diltiazem 420 MG 24 hr capsule  Commonly known as:  TIAZAC  Replaced by:  diltiazem 120 MG 24 hr capsule      TAKE these medications        amiodarone 200 MG tablet  Commonly known as:  PACERONE  Take 1 tablet (200 mg total) by mouth 2 (two) times daily.     aspirin EC 81 MG tablet  Take 81 mg by mouth daily.     diltiazem 120 MG 24 hr capsule  Commonly known as:  CARDIZEM CD  Take 1 capsule (120 mg total) by mouth daily.     lisinopril 10 MG tablet  Commonly known as:  PRINIVIL,ZESTRIL  Take  10 mg by mouth daily.         Duration of Discharge Encounter   Greater than 30 minutes including physician time.  Hilbert Corrigan PA-C Pager: [phone] 12/24/2014, 8:44 AM

## 2014-12-24 NOTE — Progress Notes (Signed)
Pt discharging via wheelchair, escorted by student nurse, accompanied by family. Discharge instructions provided to patient and family, verbalize understanding and able to provide appropriate teach back. PIVs discontinued, sites without s/s of complication. Tele box removed and returned to unit. Denies needs or questions at this time.

## 2014-12-24 NOTE — Care Management (Signed)
Medicare Important Message given? Yes (If Response is "NO", the following Medicare IM given date fields will be blank) Date Medicare IM given:  12/23/14 Medicare IM given by: Elenor Quinones  Medicare Important Message given? Yes (If Response is "NO", the following Medicare IM given date fields will be blank) Date Medicare IM given:  12/24/14 Medicare IM given by: Elenor Quinones

## 2014-12-24 NOTE — Progress Notes (Addendum)
Subjective:  Has been getting tired, SOB over past several months.  Last night OK. No dizzy.  Objective:  Vital Signs in the last 24 hours: Temp:  [97.2 F (36.2 C)-98.1 F (36.7 C)] 97.2 F (36.2 C) (05/13 0445) Pulse Rate:  [0-83] 65 (05/13 0445) Resp:  [0-86] 18 (05/13 0445) BP: (133-195)/(58-142) 166/73 mmHg (05/13 0445) SpO2:  [0 %-100 %] 95 % (05/13 0445) Weight:  [244 lb 7.8 oz (110.9 kg)] 244 lb 7.8 oz (110.9 kg) (05/13 0520)  Intake/Output from previous day: 05/12 0701 - 05/13 0700 In: 1339.9 [P.O.:360; I.V.:979.9] Out: 650 [Urine:650]   Physical Exam: General: Well developed, well nourished, in no acute distress. Looks younger than age Head:  Normocephalic and atraumatic. Lungs: Clear to auscultation and percussion. Heart: Normal S1 and S2 with frequent ectopy. 3/6 SEM RUSB, no rubs or gallops.  Abdomen: soft, non-tender, positive bowel sounds. Obese Extremities: No clubbing or cyanosis. Trace edema. Cath site c/d/i Neurologic: Alert and oriented x 3.    Lab Results:  Recent Labs  12/21/14 2045 12/23/14 1640  WBC 7.2 6.5  HGB 13.6 13.3  PLT 198 191    Recent Labs  12/21/14 1155  12/22/14 0704 12/23/14 1640  NA 141  --  141  --   K 4.2  --  4.1  --   CL 107  --  108  --   CO2 23  --  25  --   GLUCOSE 102*  --  104*  --   BUN 23*  --  23*  --   CREATININE 1.42*  < > 1.44* 1.35*  < > = values in this interval not displayed.  Recent Labs  12/22/14 0038 12/22/14 0704  TROPONINI <0.03 <0.03   Hepatic Function Panel  Recent Labs  12/22/14 0704  PROT 6.0*  ALBUMIN 3.5  AST 18  ALT 14*  ALKPHOS 72  BILITOT 0.7    Recent Labs  12/22/14 0704  CHOL 150   No results for input(s): PROTIME in the last 72 hours.  Imaging: No results found. Personally viewed.   Telemetry: Frequent PVC, bigem Personally viewed. Occas couplet  Cardiac Studies:  ECHO - prelim, at least moderate AS (3.37m/s) may be worse (off angle CW Doppler). Normal  EF.   Scheduled Meds: . aspirin EC  81 mg Oral Daily  . diltiazem  240 mg Oral Daily  . heparin  5,000 Units Subcutaneous 3 times per day  . sodium chloride  3 mL Intravenous Q12H  . sodium chloride  3 mL Intravenous Q12H   Continuous Infusions:   PRN Meds:.sodium chloride, acetaminophen, ALPRAZolam, nitroGLYCERIN, ondansetron (ZOFRAN) IV, oxyCODONE-acetaminophen, sodium chloride, sodium chloride  Assessment/Plan:  Active Problems:   Essential hypertension   CKD (chronic kidney disease), stage III   Ventricular bigeminy   Aortic stenosis  81 year old with newly discovered aortic stenosis (moderate, possible severe), worsening dyspnea/fatigue, CKD 3, obesity, frequent PVC's.  Plan:  - Left and right heart cath reassuring - minimal CAD. Normal EF. No significant gradient on cath across AV. ECHO moderate AS.   - restart lisinopril 10mg  (ACE-I) (HTN) - PVC's - will start amiodarone 200mg  PO BID to suppress PVC's and possibly increase overall effective heart rate. Discussed with Dr. Lovena Le of EP. Discussed risks of meds. Monitor Thyroid, LFT, lungs. He understands. May cause worsening bradycardia. Pacer. Will closely monitor and load slowly.  - Will cut diltiazem back again to 120 CD.   - As outpt consider low  dose lasix (LVEDP 19). Would wait and see how BP responds to amio and restarting ace  Will DC home. F/u with me or APP in 1-2 weeks. ECG on follow up.     Clinton Holmes, [place] 12/24/2014, 8:01 AM

## 2015-01-05 ENCOUNTER — Ambulatory Visit (INDEPENDENT_AMBULATORY_CARE_PROVIDER_SITE_OTHER): Payer: Medicare Other | Admitting: Cardiology

## 2015-01-05 ENCOUNTER — Encounter: Payer: Self-pay | Admitting: Cardiology

## 2015-01-05 VITALS — BP 190/90 | HR 58 | Ht 72.0 in | Wt 254.8 lb

## 2015-01-05 DIAGNOSIS — I499 Cardiac arrhythmia, unspecified: Secondary | ICD-10-CM

## 2015-01-05 DIAGNOSIS — I10 Essential (primary) hypertension: Secondary | ICD-10-CM | POA: Diagnosis not present

## 2015-01-05 DIAGNOSIS — I498 Other specified cardiac arrhythmias: Secondary | ICD-10-CM

## 2015-01-05 DIAGNOSIS — I350 Nonrheumatic aortic (valve) stenosis: Secondary | ICD-10-CM | POA: Diagnosis not present

## 2015-01-05 MED ORDER — AMIODARONE HCL 200 MG PO TABS: 200.0000 mg | ORAL_TABLET | Freq: Every day | ORAL | Status: DC

## 2015-01-05 NOTE — Progress Notes (Signed)
Cardiology Office Note   Date:  01/05/2015   ID:  WANDA CELLUCCI, DOB 1928-07-23, MRN [id number]  PCP:  Leonides Sake, MD  Cardiologist:   Candee Furbish, MD       History of Present Illness: Clinton Holmes is a 81 y.o. male who presents for post hospital follow-up. Had minimal coronary artery disease on left and right heart catheterization. Echo card gram demonstrated moderate aortic stenosis however catheterization showed no significant gradient. He had ventricular bigeminy/frequent PVCs and was started on amiodarone 200 mg twice a day after discussion with Dr. Lovena Le of electrophysiology. I cut back his diltiazem to 120 mg once a day. Consideration of low-dose Lasix EDP 19. Waiting on restarting ACE inhibitor.  On auscultation, he has many more regular beats. I did hear PVC about once every 8 beats. Much improved. He feels better. He is quite active, working the land. [place]. No significant shortness of breath. Daughter present. Feels well.    Past Medical History  Diagnosis Date  . Hypertension   . OA (osteoarthritis)     a. 2011 s/p bilat TKA.  . CKD (chronic kidney disease), stage III   . Essential hypertension   . Ventricular bigeminy     a. 12/2014. Started on amio on 12/24/2014  . Murmur   . CAD (coronary artery disease)     L&RHC 12/23/2014 elevated LVEDP, prox LAD 45%, 60% ramus    Past Surgical History  Procedure Laterality Date  . Knee surgery    . Cardiac catheterization N/A 12/23/2014    Procedure: Right/Left Heart Cath and Coronary Angiography;  Surgeon: Belva Crome, MD;  Location: [facility];  Service: Cardiovascular;  Laterality: N/A;     Current Outpatient Prescriptions  Medication Sig Dispense Refill  . amiodarone (PACERONE) 200 MG tablet Take 1 tablet (200 mg total) by mouth daily. 60 tablet 2  . aspirin EC 81 MG tablet Take 81 mg by mouth daily.    Marland Kitchen diltiazem (CARDIZEM CD) 120 MG 24 hr capsule Take 1 capsule (120 mg total) by mouth daily. 30  capsule 3  . lisinopril (PRINIVIL,ZESTRIL) 10 MG tablet Take 10 mg by mouth daily.     . Multiple Vitamins-Minerals (MULTIVITAMIN ADULTS 50+ PO) Take 1 tablet by mouth daily.     No current facility-administered medications for this visit.    Allergies:   Review of patient's allergies indicates no known allergies.    Social History:  The patient  reports that he has quit smoking. He does not have any smokeless tobacco history on file. He reports that he does not drink alcohol or use illicit drugs.   Family History:  The patient's family history includes CAD in his mother; Congestive Heart Failure in his sister; Heart attack in his father; Other in his brother.    ROS:  Please see the history of present illness.   Otherwise, review of systems are positive for none.   All other systems are reviewed and negative.    PHYSICAL EXAM: VS:  BP 190/90 mmHg  Pulse 58  Ht 6' (1.829 m)  Wt 254 lb 12.8 oz (115.577 kg)  BMI 34.55 kg/m2  SpO2 95% , BMI Body mass index is 34.55 kg/(m^2). GEN: Well nourished, well developed, in no acute distress, looks younger than stated age 21: normal Neck: no JVD, carotid bruits, or masses Cardiac: Bradycardic regular with occasional ectopy; 2/6 systolic right upper sternal border murmurs,no rubs, or gallops,no edema  Respiratory:  clear  to auscultation bilaterally, normal work of breathing GI: soft, nontender, nondistended, + BS MS: no deformity or atrophy Skin: warm and dry, no rash Neuro:  Strength and sensation are intact Psych: euthymic mood, full affect   EKG:  EKG is ordered today. The ekg ordered today demonstrates on 01/05/15-sinus bradycardia rate 54 with 1 PVC noted. QTC 470 ms. When compared to prior, ventricular bigeminy has extinguished.  ECHO: 12/22/14 - Left ventricle: The cavity size was normal. There was moderate concentric hypertrophy. Systolic function was normal. The estimated ejection fraction was in the range of 55% to 60%.  Wall motion was normal; there were no regional wall motion abnormalities. ncessant bigeminy limits evaluation of LV diastolic function. Doppler parameters are consistent with elevated mean left atrial filling pressure. - Aortic valve: There was moderate stenosis. There was trivial regurgitation. Valve area (VTI): 1.26 cm^2. Valve area (Vmax): 1.28 cm^2. Valve area (Vmean): 1.15 cm^2. - Mitral valve: There was mild to moderate regurgitation directed eccentrically and posteriorly. - Left atrium: The atrium was severely dilated. - Right ventricle: The cavity size was mildly dilated. Wall thickness was normal. - Pulmonary arteries: Systolic pressure was mildly increased. PA peak pressure: 39 mm Hg (S).  Cardiac catheterization 12/23/14  Widely patent coronary arteries with 45% proximal LAD narrowing. The ramus intermedius branches and the more medial/anterior branch contains a 60% stenosis. No hemodynamically significant coronary lesions were identified.  No evidence of aortic stenosis  Mild pulmonary hypertension  Apparent mild global left ventricular hypokinesis although left ventriculography was of poor quality due to faint opacification.   Recent Labs: 12/21/2014: B Natriuretic Peptide 517.7*; Magnesium 2.1; TSH 1.156 12/22/2014: ALT 14*; BUN 23*; Potassium 4.1; Sodium 141 12/23/2014: Creatinine 1.35*; Hemoglobin 13.3; Platelets 191    Lipid Panel    Component Value Date/Time   CHOL 150 12/22/2014 0704   TRIG 74 12/22/2014 0704   HDL 28* 12/22/2014 0704   CHOLHDL 5.4 12/22/2014 0704   VLDL 15 12/22/2014 0704   LDLCALC 107* 12/22/2014 0704      Wt Readings from Last 3 Encounters:  01/05/15 254 lb 12.8 oz (115.577 kg)  12/24/14 244 lb 7.8 oz (110.9 kg)    TSH normal, LFTs normal. BNP 517.  Other studies Reviewed: Additional studies/ records that were reviewed today include: Hospital records, lab work. Review of the above records demonstrates: As  above.   ASSESSMENT AND PLAN:  1.  Symptomatic bradycardia as a result of ventricular bigeminy-amiodarone has helped out significantly. I only saw 1 PVC on his EKG. We will decrease his dosage to 200 mg once a day for the lowest effective dose. Thyroid, LFTs, chest x-ray were previously done. I counseled him on sun screen. I counseled him and his daughter on the toxicities of this medication. Cumulative effects. Doing well currently.  2. Nonobstructive coronary artery disease-minimal CAD on catheterization.  3. Obesity-continue to encourage weight loss  4. Chronic kidney disease stage III. Continue with ACE inhibitor.  5. Essential hypertension-elevated today. Previously has been under reasonable control. I do not want to increase his diltiazem because of possibility of bradycardia especially with amiodarone. Lisinopril is currently 10 mg. We could increase this to 20 mg and check a basic metabolic profile in the future. Could consider hydralazine.  6. Aortic stenosis-possibly moderate on echocardiogram. No gradient on catheterization. Murmur appreciated. Continue to monitor.  Current medicines are reviewed at length with the patient today.  The patient does not have concerns regarding medicines.  The following changes have been  made:  Decreasing amiodarone to 200 mg once a day.  Labs/ tests ordered today include: In 3 months, I will check LFTs, TSH.  Orders Placed This Encounter  Procedures  . EKG 12-Lead     Disposition:   FU with Clinton Holmes in 3 months  Signed, Candee Furbish, MD  01/05/2015 10:34 AM    [facility] [place], [place], [place]  [address] Phone: [phone]; Fax: [phone]

## 2015-01-05 NOTE — Patient Instructions (Addendum)
Medication Instructions:  Your physician has recommended you make the following change in your medication:  1) DECREASE Amiodarone to 200 mg by mouth ONCE daily   Labwork: NONE  Testing/Procedures: NONE  Follow-Up: Your physician recommends that you schedule a follow-up appointment in: 3 months with Dr. Marlou Porch  Any Other Special Instructions Will Be Listed Below (If Applicable).

## 2015-02-03 DIAGNOSIS — D485 Neoplasm of uncertain behavior of skin: Secondary | ICD-10-CM | POA: Diagnosis not present

## 2015-02-03 DIAGNOSIS — L821 Other seborrheic keratosis: Secondary | ICD-10-CM | POA: Diagnosis not present

## 2015-03-05 DIAGNOSIS — M6281 Muscle weakness (generalized): Secondary | ICD-10-CM | POA: Diagnosis not present

## 2015-03-24 DIAGNOSIS — I10 Essential (primary) hypertension: Secondary | ICD-10-CM | POA: Diagnosis not present

## 2015-03-24 DIAGNOSIS — Z1389 Encounter for screening for other disorder: Secondary | ICD-10-CM | POA: Diagnosis not present

## 2015-03-24 DIAGNOSIS — Z6833 Body mass index (BMI) 33.0-33.9, adult: Secondary | ICD-10-CM | POA: Diagnosis not present

## 2015-03-24 DIAGNOSIS — Z9181 History of falling: Secondary | ICD-10-CM | POA: Diagnosis not present

## 2015-03-24 DIAGNOSIS — R001 Bradycardia, unspecified: Secondary | ICD-10-CM | POA: Diagnosis not present

## 2015-04-04 ENCOUNTER — Ambulatory Visit (INDEPENDENT_AMBULATORY_CARE_PROVIDER_SITE_OTHER): Payer: Medicare Other | Admitting: Cardiology

## 2015-04-04 ENCOUNTER — Encounter: Payer: Self-pay | Admitting: Cardiology

## 2015-04-04 VITALS — BP 152/86 | HR 76 | Ht 72.0 in | Wt 246.0 lb

## 2015-04-04 DIAGNOSIS — I499 Cardiac arrhythmia, unspecified: Secondary | ICD-10-CM

## 2015-04-04 DIAGNOSIS — N183 Chronic kidney disease, stage 3 unspecified: Secondary | ICD-10-CM

## 2015-04-04 DIAGNOSIS — I350 Nonrheumatic aortic (valve) stenosis: Secondary | ICD-10-CM

## 2015-04-04 DIAGNOSIS — Z79899 Other long term (current) drug therapy: Secondary | ICD-10-CM | POA: Diagnosis not present

## 2015-04-04 DIAGNOSIS — I10 Essential (primary) hypertension: Secondary | ICD-10-CM | POA: Diagnosis not present

## 2015-04-04 DIAGNOSIS — I498 Other specified cardiac arrhythmias: Secondary | ICD-10-CM

## 2015-04-04 LAB — HEPATIC FUNCTION PANEL
ALBUMIN: 4.3 g/dL (ref 3.5–5.2)
ALT: 13 U/L (ref 0–53)
AST: 17 U/L (ref 0–37)
Alkaline Phosphatase: 66 U/L (ref 39–117)
Bilirubin, Direct: 0.1 mg/dL (ref 0.0–0.3)
Total Bilirubin: 0.5 mg/dL (ref 0.2–1.2)
Total Protein: 7.2 g/dL (ref 6.0–8.3)

## 2015-04-04 LAB — TSH: TSH: 1.16 u[IU]/mL (ref 0.35–4.50)

## 2015-04-04 LAB — T4, FREE: Free T4: 1.07 ng/dL (ref 0.60–1.60)

## 2015-04-04 NOTE — Patient Instructions (Signed)
Medication Instructions:  Your physician recommends that you continue on your current medications as directed. Please refer to the Current Medication list given to you today.  Labwork: Please have blood work today. (TSH, Free T4 and Hepatic panel)  Follow-Up: Follow up in 6 months with Dr. Marlou Porch.  You will receive a letter in the mail 2 months before you are due.  Please call us when you receive this letter to schedule your follow up appointment.  Thank you for choosing [facility]!!

## 2015-04-04 NOTE — Progress Notes (Signed)
Cardiology Office Note   Date:  04/04/2015   ID:  ORVIE CARADINE, DOB [birth year], MRN [id number]  PCP:  Leonides Sake, MD  Cardiologist:   Candee Furbish, MD       History of Present Illness: [facility] is a 81 y.o. male who presents for post hospital follow-up. Had minimal coronary artery disease on left and right heart catheterization. Echo card gram demonstrated moderate aortic stenosis however catheterization showed no significant gradient. He had ventricular bigeminy/frequent PVCs and was started on amiodarone 200 mg twice a day after discussion with Dr. Lovena Le of electrophysiology. I cut back his diltiazem to 120 mg once a day. Consideration of low-dose Lasix EDP 19. Waiting on restarting ACE inhibitor.  On auscultation, he has many more regular beats. I did hear PVC about once every 8 beats. Much improved. He feels better. He is quite active, working the land. [place]. No significant shortness of breath. Daughter present. Feels well.  04/04/59-she has been doing well with the amiodarone. This has been successfully suppressing the majority of his PVCs. No shortness of breath, no chest pain, no syncope.    Past Medical History  Diagnosis Date  . Hypertension   . OA (osteoarthritis)     a. 2011 s/p bilat TKA.  . CKD (chronic kidney disease), stage III   . Essential hypertension   . Ventricular bigeminy     a. 12/2014. Started on amio on 12/24/2014  . Murmur   . CAD (coronary artery disease)     L&RHC 12/23/2014 elevated LVEDP, prox LAD 45%, 60% ramus    Past Surgical History  Procedure Laterality Date  . Knee surgery    . Cardiac catheterization N/A 12/23/2014    Procedure: Right/Left Heart Cath and Coronary Angiography;  Surgeon: Belva Crome, MD;  Location: [facility];  Service: Cardiovascular;  Laterality: N/A;     Current Outpatient Prescriptions  Medication Sig Dispense Refill  . amiodarone (PACERONE) 200 MG tablet Take 1 tablet (200 mg total) by mouth  daily. 60 tablet 2  . aspirin EC 81 MG tablet Take 81 mg by mouth daily.    Marland Kitchen diltiazem (CARDIZEM CD) 120 MG 24 hr capsule Take 1 capsule (120 mg total) by mouth daily. 30 capsule 3  . lisinopril (PRINIVIL,ZESTRIL) 10 MG tablet Take 10 mg by mouth daily.     . Multiple Vitamins-Minerals (MULTIVITAMIN ADULTS 50+ PO) Take 1 tablet by mouth daily.     No current facility-administered medications for this visit.    Allergies:   Review of patient's allergies indicates no known allergies.    Social History:  The patient  reports that he has quit smoking. He does not have any smokeless tobacco history on file. He reports that he does not drink alcohol or use illicit drugs.   Family History:  The patient's family history includes CAD in his mother; Congestive Heart Failure in his sister; Heart attack in his father; Other in his brother.    ROS:  Please see the history of present illness.   Otherwise, review of systems are positive for none.   All other systems are reviewed and negative.    PHYSICAL EXAM: VS:  BP 152/86 mmHg  Pulse 76  Ht 6' (1.829 m)  Wt 246 lb (111.585 kg)  BMI 33.36 kg/m2  SpO2 94% , BMI Body mass index is 33.36 kg/(m^2). GEN: Well nourished, well developed, in no acute distress, looks younger than stated age 35: normal Neck: no  JVD, carotid bruits, or masses Cardiac: Bradycardic regular with occasional ectopy; 2/6 systolic right upper sternal border murmurs,no rubs, or gallops,no edema  Respiratory:  clear to auscultation bilaterally, normal work of breathing GI: soft, nontender, nondistended, + BS MS: no deformity or atrophy Skin: warm and dry, no rash Neuro:  Strength and sensation are intact Psych: euthymic mood, full affect   EKG:  EKG is ordered today. The ekg ordered today demonstrates on 01/05/15-sinus bradycardia rate 54 with 1 PVC noted. QTC 470 ms. When compared to prior, ventricular bigeminy has extinguished.  ECHO: 12/22/14 - Left ventricle: The  cavity size was normal. There was moderate concentric hypertrophy. Systolic function was normal. The estimated ejection fraction was in the range of 55% to 60%. Wall motion was normal; there were no regional wall motion abnormalities. ncessant bigeminy limits evaluation of LV diastolic function. Doppler parameters are consistent with elevated mean left atrial filling pressure. - Aortic valve: There was moderate stenosis. There was trivial regurgitation. Valve area (VTI): 1.26 cm^2. Valve area (Vmax): 1.28 cm^2. Valve area (Vmean): 1.15 cm^2. - Mitral valve: There was mild to moderate regurgitation directed eccentrically and posteriorly. - Left atrium: The atrium was severely dilated. - Right ventricle: The cavity size was mildly dilated. Wall thickness was normal. - Pulmonary arteries: Systolic pressure was mildly increased. PA peak pressure: 39 mm Hg (S).  Cardiac catheterization 12/23/14  Widely patent coronary arteries with 45% proximal LAD narrowing. The ramus intermedius branches and the more medial/anterior branch contains a 60% stenosis. No hemodynamically significant coronary lesions were identified.  No evidence of aortic stenosis  Mild pulmonary hypertension  Apparent mild global left ventricular hypokinesis although left ventriculography was of poor quality due to faint opacification.   Recent Labs: 12/21/2014: B Natriuretic Peptide 517.7*; Magnesium 2.1; TSH 1.156 12/22/2014: ALT 14*; BUN 23*; Potassium 4.1; Sodium 141 12/23/2014: Creatinine, Ser 1.35*; Hemoglobin 13.3; Platelets 191    Lipid Panel    Component Value Date/Time   CHOL 150 12/22/2014 0704   TRIG 74 12/22/2014 0704   HDL 28* 12/22/2014 0704   CHOLHDL 5.4 12/22/2014 0704   VLDL 15 12/22/2014 0704   LDLCALC 107* 12/22/2014 0704      Wt Readings from Last 3 Encounters:  04/04/15 246 lb (111.585 kg)  01/05/15 254 lb 12.8 oz (115.577 kg)  12/24/14 244 lb 7.8 oz (110.9 kg)      TSH normal, LFTs normal. BNP 517.  Other studies Reviewed: Additional studies/ records that were reviewed today include: Hospital records, [facility] work. Review of the above records demonstrates: As above.   ASSESSMENT AND PLAN:  1.  Symptomatic bradycardia as a result of ventricular bigeminy-amiodarone has helped out significantly. I only saw 1 PVC on his EKG. We will decrease his dosage to 200 mg once a day for the lowest effective dose. Thyroid, LFTs, chest x-ray were previously done. I counseled him on sun screen. I counseled him and his daughter on the toxicities of this medication. Cumulative effects. Doing well currently. Checking [facility] work today.  2. Nonobstructive coronary artery disease-minimal CAD on catheterization.  3. Obesity-continue to encourage weight loss  4. Chronic kidney disease stage III. Continue with ACE inhibitor.  5. Essential hypertension-mildly elevated today. Previously has been under reasonable control. At primary physician's office, it was normal he states. I do not want to increase his diltiazem because of possibility of bradycardia especially with amiodarone. Lisinopril is currently 10 mg.   6. Aortic stenosis-possibly moderate on echocardiogram. No gradient on catheterization. Murmur  appreciated. Continue to monitor.  Current medicines are reviewed at length with the patient today.  The patient does not have concerns regarding medicines.  The following changes have been made:  None   Labs/ tests ordered today include: I will check LFTs, TSH Free T4  No orders of the defined types were placed in this encounter.     Disposition:   FU with Saoirse Legere in 6 months  Signed, Candee Furbish, MD  04/04/2015 10:10 AM    [facility] [place], [place], [place]  [address] Phone: [phone]; Fax: [phone]

## 2015-07-15 DIAGNOSIS — H538 Other visual disturbances: Secondary | ICD-10-CM | POA: Diagnosis not present

## 2015-07-15 DIAGNOSIS — H47012 Ischemic optic neuropathy, left eye: Secondary | ICD-10-CM | POA: Diagnosis not present

## 2015-07-15 DIAGNOSIS — H4710 Unspecified papilledema: Secondary | ICD-10-CM | POA: Diagnosis not present

## 2015-07-19 DIAGNOSIS — I779 Disorder of arteries and arterioles, unspecified: Secondary | ICD-10-CM | POA: Diagnosis not present

## 2015-07-19 DIAGNOSIS — Z886 Allergy status to analgesic agent status: Secondary | ICD-10-CM | POA: Diagnosis not present

## 2015-07-19 DIAGNOSIS — H269 Unspecified cataract: Secondary | ICD-10-CM | POA: Diagnosis not present

## 2015-07-19 DIAGNOSIS — R7982 Elevated C-reactive protein (CRP): Secondary | ICD-10-CM | POA: Diagnosis not present

## 2015-07-19 DIAGNOSIS — Z87891 Personal history of nicotine dependence: Secondary | ICD-10-CM | POA: Diagnosis not present

## 2015-07-19 DIAGNOSIS — I10 Essential (primary) hypertension: Secondary | ICD-10-CM | POA: Diagnosis not present

## 2015-07-19 DIAGNOSIS — H4710 Unspecified papilledema: Secondary | ICD-10-CM | POA: Diagnosis not present

## 2015-07-19 DIAGNOSIS — I7090 Unspecified atherosclerosis: Secondary | ICD-10-CM | POA: Diagnosis not present

## 2015-07-19 DIAGNOSIS — I7789 Other specified disorders of arteries and arterioles: Secondary | ICD-10-CM | POA: Diagnosis not present

## 2015-07-19 DIAGNOSIS — H5704 Mydriasis: Secondary | ICD-10-CM | POA: Diagnosis not present

## 2015-07-19 DIAGNOSIS — H3581 Retinal edema: Secondary | ICD-10-CM | POA: Diagnosis not present

## 2015-07-19 DIAGNOSIS — H47012 Ischemic optic neuropathy, left eye: Secondary | ICD-10-CM | POA: Diagnosis not present

## 2015-07-26 DIAGNOSIS — H47012 Ischemic optic neuropathy, left eye: Secondary | ICD-10-CM | POA: Diagnosis not present

## 2015-08-02 ENCOUNTER — Telehealth: Payer: Self-pay | Admitting: Cardiology

## 2015-08-02 NOTE — Telephone Encounter (Signed)
New Message  Pt daughter calling to speak w/ RN- stated pt is experiencing blindness in one eye- thinks it has to do w/ amiodarone. Please call back and discuss.

## 2015-08-02 NOTE — Telephone Encounter (Signed)
Reviewed Dr Marlou Porch note with daughter who states understanding.  She will have pt f/u with PCP and keep appt scheduled MS in 1/17.

## 2015-08-02 NOTE — Telephone Encounter (Signed)
Spoke with daughter.  She is reporting pt has had decrease in vision for about 1 month in his left eye.  "If pt is looking at you with his left eye he can only see you from the nose up."  He has been evaluated by eye Dr in [facility] who reports after testing pt is getting enough blood flow to his eye and he doesn't know what is causing the change in vision.  He did suggest the pt stop Amiodarone which he has done.  He is monitoring he HR and BP daily and has been scheduled to see Dr Marlou Porch in January.  Attempted to schedule an earlier appt with an APP however per daughter the pt wants to wait to see Dr Marlou Porch.  Advised to continue to monitor HR and BP daily and keep a diary to bring with him to the office visit.  Advised to call office or report to ED for eval if he has a blood in HR and has s/s.  Per daughter BP has been between 160-170/80's and HR in the 60's and 70's.  Will forward information to Dr Marlou Porch for review and further recommendations.

## 2015-08-02 NOTE — Telephone Encounter (Signed)
They did the right thing by going to [facility].  Stopping amiodarone is reasonable.  Ask them to discuss with Dr. Lisbeth Ply, PCP. ?neurology referral?  [facility] Furbish, MD

## 2015-08-04 ENCOUNTER — Telehealth: Payer: Self-pay | Admitting: Cardiology

## 2015-08-04 NOTE — Telephone Encounter (Signed)
Requested she contact PCP as instructed by Dr Marlou Porch.  She stated understanding and will contact that office.

## 2015-08-04 NOTE — Telephone Encounter (Signed)
New Message    Pt calling daughter is calling for a referral to a neurologist

## 2015-08-26 ENCOUNTER — Ambulatory Visit (INDEPENDENT_AMBULATORY_CARE_PROVIDER_SITE_OTHER): Payer: Medicare Other | Admitting: Cardiology

## 2015-08-26 ENCOUNTER — Encounter: Payer: Self-pay | Admitting: Cardiology

## 2015-08-26 VITALS — BP 132/76 | HR 68 | Ht 72.0 in | Wt 249.4 lb

## 2015-08-26 DIAGNOSIS — I350 Nonrheumatic aortic (valve) stenosis: Secondary | ICD-10-CM

## 2015-08-26 DIAGNOSIS — I499 Cardiac arrhythmia, unspecified: Secondary | ICD-10-CM

## 2015-08-26 DIAGNOSIS — N183 Chronic kidney disease, stage 3 unspecified: Secondary | ICD-10-CM

## 2015-08-26 DIAGNOSIS — I10 Essential (primary) hypertension: Secondary | ICD-10-CM | POA: Diagnosis not present

## 2015-08-26 DIAGNOSIS — D333 Benign neoplasm of cranial nerves: Secondary | ICD-10-CM | POA: Diagnosis not present

## 2015-08-26 DIAGNOSIS — Z6832 Body mass index (BMI) 32.0-32.9, adult: Secondary | ICD-10-CM | POA: Diagnosis not present

## 2015-08-26 DIAGNOSIS — H47019 Ischemic optic neuropathy, unspecified eye: Secondary | ICD-10-CM | POA: Diagnosis not present

## 2015-08-26 DIAGNOSIS — I498 Other specified cardiac arrhythmias: Secondary | ICD-10-CM

## 2015-08-26 NOTE — Progress Notes (Signed)
Cardiology Office Note   Date:  08/26/2015   ID:  [facility], DOB Apr 03, 1928, MRN [id number]  PCP:  Leonides Sake, MD  Cardiologist:   Candee Furbish, MD       History of Present Illness: [facility] is a 82 y.o. male who presents follow-up mild coronary artery disease on left and right heart catheterization. Echo demonstrated moderate aortic stenosis however catheterization showed no significant gradient. He had ventricular bigeminy/frequent PVCs and was started on amiodarone 200 mg twice a day after discussion with Dr. Lovena Le of electrophysiology. Amiodarone has subsequently been stopped. I cut back his diltiazem to 120 mg once a day. Consideration of low-dose Lasix EDP 19. Waiting on restarting ACE inhibitor.  Since her last visit, on 08/02/15 he had about a month or so of left eye visual defect. Amiodarone was stopped. Neurologist also was consult it and started him on Plavix and atorvastatin for possible stroke causing visual field defect.  Currently off of amiodarone he is doing well without any significant palpitations. Much improved. He feels better. He is quite active, working the land. [place]. No significant shortness of breath. Daughter present. Feels well.     Past Medical History  Diagnosis Date  . Hypertension   . OA (osteoarthritis)     a. 2011 s/p bilat TKA.  . CKD (chronic kidney disease), stage III   . Essential hypertension   . Ventricular bigeminy     a. 12/2014. Started on amio on 12/24/2014  . Murmur   . CAD (coronary artery disease)     L&RHC 12/23/2014 elevated LVEDP, prox LAD 45%, 60% ramus    Past Surgical History  Procedure Laterality Date  . Knee surgery    . Cardiac catheterization N/A 12/23/2014    Procedure: Right/Left Heart Cath and Coronary Angiography;  Surgeon: Belva Crome, MD;  Location: [facility];  Service: Cardiovascular;  Laterality: N/A;     Current Outpatient Prescriptions  Medication Sig Dispense Refill  . aspirin  EC 81 MG tablet Take 81 mg by mouth daily.    Marland Kitchen diltiazem (CARDIZEM CD) 120 MG 24 hr capsule Take 1 capsule (120 mg total) by mouth daily. 30 capsule 3  . lisinopril (PRINIVIL,ZESTRIL) 10 MG tablet Take 10 mg by mouth daily.     . Multiple Vitamins-Minerals (MULTIVITAMIN ADULTS 50+ PO) Take 1 tablet by mouth daily.     No current facility-administered medications for this visit.    Allergies:   Review of patient's allergies indicates no known allergies.    Social History:  The patient  reports that he has quit smoking. He does not have any smokeless tobacco history on file. He reports that he does not drink alcohol or use illicit drugs.   Family History:  The patient's family history includes CAD in his mother; Congestive Heart Failure in his sister; Heart attack in his father; Other in his brother.    ROS:  Please see the history of present illness.   Otherwise, review of systems are positive for none.   All other systems are reviewed and negative.    PHYSICAL EXAM: VS:  BP 132/76 mmHg  Pulse 68  Ht 6' (1.829 m)  Wt 249 lb 6.4 oz (113.127 kg)  BMI 33.82 kg/m2  SpO2 94% , BMI Body mass index is 33.82 kg/(m^2). GEN: Well nourished, well developed, in no acute distress, looks younger than stated age 55: normal Neck: no JVD, carotid bruits, or masses Cardiac: Regular with no  ectopy; 2/6 systolic right upper sternal border murmurs,no rubs, or gallops,no edema  Respiratory:  clear to auscultation bilaterally, normal work of breathing GI: soft, nontender, nondistended, + BS MS: no deformity or atrophy Skin: warm and dry, no rash Neuro:  Strength and sensation are intact, left visual field defect Psych: euthymic mood, full affect   EKG:   The ekg  01/05/15-sinus bradycardia rate 54 with 1 PVC noted. QTC 470 ms. When compared to prior, ventricular bigeminy has extinguished.  ECHO: 12/22/14 - Left ventricle: The cavity size was normal. There was moderate concentric hypertrophy.  Systolic function was normal. The estimated ejection fraction was in the range of 55% to 60%. Wall motion was normal; there were no regional wall motion abnormalities. ncessant bigeminy limits evaluation of LV diastolic function. Doppler parameters are consistent with elevated mean left atrial filling pressure. - Aortic valve: There was moderate stenosis. There was trivial regurgitation. Valve area (VTI): 1.26 cm^2. Valve area (Vmax): 1.28 cm^2. Valve area (Vmean): 1.15 cm^2. - Mitral valve: There was mild to moderate regurgitation directed eccentrically and posteriorly. - Left atrium: The atrium was severely dilated. - Right ventricle: The cavity size was mildly dilated. Wall thickness was normal. - Pulmonary arteries: Systolic pressure was mildly increased. PA peak pressure: 39 mm Hg (S).  Cardiac catheterization 12/23/14  Widely patent coronary arteries with 45% proximal LAD narrowing. The ramus intermedius branches and the more medial/anterior branch contains a 60% stenosis. No hemodynamically significant coronary lesions were identified.  No evidence of aortic stenosis  Mild pulmonary hypertension  Apparent mild global left ventricular hypokinesis although left ventriculography was of poor quality due to faint opacification.   Recent Labs: 12/21/2014: B Natriuretic Peptide 517.7*; Magnesium 2.1 12/22/2014: BUN 23*; Potassium 4.1; Sodium 141 12/23/2014: Creatinine, Ser 1.35*; Hemoglobin 13.3; Platelets 191 04/04/2015: ALT 13; TSH 1.16    Lipid Panel    Component Value Date/Time   CHOL 150 12/22/2014 0704   TRIG 74 12/22/2014 0704   HDL 28* 12/22/2014 0704   CHOLHDL 5.4 12/22/2014 0704   VLDL 15 12/22/2014 0704   LDLCALC 107* 12/22/2014 0704      Wt Readings from Last 3 Encounters:  08/26/15 249 lb 6.4 oz (113.127 kg)  04/04/15 246 lb (111.585 kg)  01/05/15 254 lb 12.8 oz (115.577 kg)    TSH normal, LFTs normal. BNP 517.  Other studies  Reviewed: Additional studies/ records that were reviewed today include: Hospital records, lab work. Review of the above records demonstrates: As above.   ASSESSMENT AND PLAN:  Symptomatic bradycardia as a result of ventricular bigeminy -amiodarone previously helped out significantly however this was stopped after visual field defect noted left eye. Usually, amiodarone causes deposition scattered throughout retina and does not in and of itself cause a well demarcated hemianopsia. It is likely that he has had a stroke. Neurology is working this up. Pending MRI. Agree with Plavix, atorvastatin addition.   Left eye stroke symptoms  - As above  Nonobstructive coronary artery disease -minimal CAD on catheterization.  Obesity -continue to encourage weight loss  Chronic kidney disease stage III.   -Continue with ACE inhibitor.  Essential hypertension -mildly elevated today. Previously has been under reasonable control. At primary physician's office, it was normal he states. I do not want to increase his diltiazem because of possibility of bradycardia especially with amiodarone. Lisinopril is currently 10 mg.   Aortic stenosis -possibly moderate on echocardiogram. No gradient on catheterization. Murmur appreciated. Continue to monitor.  Current medicines are reviewed  at length with the patient today.  The patient does not have concerns regarding medicines.  The following changes have been made:  None   Labs/ tests ordered today include: I will check LFTs, TSH Free T4  No orders of the defined types were placed in this encounter.     Disposition:   FU with Riley Hallum in 6 months  Signed, Candee Furbish, MD  08/26/2015 2:11 PM    [facility] [place], [place], [place]  [address] Phone: [phone]; Fax: [phone]

## 2015-08-26 NOTE — Patient Instructions (Signed)

## 2015-09-05 DIAGNOSIS — J189 Pneumonia, unspecified organism: Secondary | ICD-10-CM | POA: Diagnosis not present

## 2015-09-05 DIAGNOSIS — R197 Diarrhea, unspecified: Secondary | ICD-10-CM | POA: Diagnosis not present

## 2015-09-05 DIAGNOSIS — Z6832 Body mass index (BMI) 32.0-32.9, adult: Secondary | ICD-10-CM | POA: Diagnosis not present

## 2015-09-06 DIAGNOSIS — G319 Degenerative disease of nervous system, unspecified: Secondary | ICD-10-CM | POA: Diagnosis not present

## 2015-09-06 DIAGNOSIS — I517 Cardiomegaly: Secondary | ICD-10-CM | POA: Diagnosis not present

## 2015-09-06 DIAGNOSIS — R9082 White matter disease, unspecified: Secondary | ICD-10-CM | POA: Diagnosis not present

## 2015-09-06 DIAGNOSIS — H47019 Ischemic optic neuropathy, unspecified eye: Secondary | ICD-10-CM | POA: Diagnosis not present

## 2015-09-06 DIAGNOSIS — I6523 Occlusion and stenosis of bilateral carotid arteries: Secondary | ICD-10-CM | POA: Diagnosis not present

## 2015-09-06 DIAGNOSIS — I10 Essential (primary) hypertension: Secondary | ICD-10-CM | POA: Diagnosis not present

## 2015-09-06 DIAGNOSIS — D333 Benign neoplasm of cranial nerves: Secondary | ICD-10-CM | POA: Diagnosis not present

## 2015-09-06 DIAGNOSIS — J328 Other chronic sinusitis: Secondary | ICD-10-CM | POA: Diagnosis not present

## 2015-09-06 DIAGNOSIS — H539 Unspecified visual disturbance: Secondary | ICD-10-CM | POA: Diagnosis not present

## 2015-09-06 DIAGNOSIS — H938X2 Other specified disorders of left ear: Secondary | ICD-10-CM | POA: Diagnosis not present

## 2015-09-06 DIAGNOSIS — R05 Cough: Secondary | ICD-10-CM | POA: Diagnosis not present

## 2015-09-08 DIAGNOSIS — D333 Benign neoplasm of cranial nerves: Secondary | ICD-10-CM | POA: Diagnosis not present

## 2015-09-08 DIAGNOSIS — I6330 Cerebral infarction due to thrombosis of unspecified cerebral artery: Secondary | ICD-10-CM | POA: Diagnosis not present

## 2015-09-08 DIAGNOSIS — H47019 Ischemic optic neuropathy, unspecified eye: Secondary | ICD-10-CM | POA: Diagnosis not present

## 2015-09-08 DIAGNOSIS — Z6832 Body mass index (BMI) 32.0-32.9, adult: Secondary | ICD-10-CM | POA: Diagnosis not present

## 2015-09-26 DIAGNOSIS — H47012 Ischemic optic neuropathy, left eye: Secondary | ICD-10-CM | POA: Diagnosis not present

## 2015-09-26 DIAGNOSIS — I10 Essential (primary) hypertension: Secondary | ICD-10-CM | POA: Diagnosis not present

## 2015-09-26 DIAGNOSIS — E782 Mixed hyperlipidemia: Secondary | ICD-10-CM | POA: Diagnosis not present

## 2015-09-26 DIAGNOSIS — E669 Obesity, unspecified: Secondary | ICD-10-CM | POA: Diagnosis not present

## 2015-09-26 DIAGNOSIS — Z6832 Body mass index (BMI) 32.0-32.9, adult: Secondary | ICD-10-CM | POA: Diagnosis not present

## 2015-10-12 DIAGNOSIS — H47012 Ischemic optic neuropathy, left eye: Secondary | ICD-10-CM | POA: Diagnosis not present

## 2015-10-17 DIAGNOSIS — Z79899 Other long term (current) drug therapy: Secondary | ICD-10-CM | POA: Diagnosis not present

## 2015-11-16 DIAGNOSIS — Z79899 Other long term (current) drug therapy: Secondary | ICD-10-CM | POA: Diagnosis not present

## 2016-03-07 DIAGNOSIS — I639 Cerebral infarction, unspecified: Secondary | ICD-10-CM | POA: Diagnosis not present

## 2016-03-07 DIAGNOSIS — H47019 Ischemic optic neuropathy, unspecified eye: Secondary | ICD-10-CM | POA: Diagnosis not present

## 2016-03-08 ENCOUNTER — Encounter: Payer: Self-pay | Admitting: Cardiology

## 2016-03-08 ENCOUNTER — Encounter (INDEPENDENT_AMBULATORY_CARE_PROVIDER_SITE_OTHER): Payer: Self-pay

## 2016-03-08 ENCOUNTER — Ambulatory Visit (INDEPENDENT_AMBULATORY_CARE_PROVIDER_SITE_OTHER): Payer: Medicare Other | Admitting: Cardiology

## 2016-03-08 VITALS — BP 160/90 | HR 65 | Ht 72.0 in | Wt 245.1 lb

## 2016-03-08 DIAGNOSIS — I499 Cardiac arrhythmia, unspecified: Secondary | ICD-10-CM | POA: Diagnosis not present

## 2016-03-08 DIAGNOSIS — I10 Essential (primary) hypertension: Secondary | ICD-10-CM

## 2016-03-08 DIAGNOSIS — I350 Nonrheumatic aortic (valve) stenosis: Secondary | ICD-10-CM | POA: Diagnosis not present

## 2016-03-08 DIAGNOSIS — I498 Other specified cardiac arrhythmias: Secondary | ICD-10-CM

## 2016-03-08 NOTE — Progress Notes (Signed)
Cardiology Office Note   Date:  03/08/2016   ID:  Clinton Holmes, DOB 16-Dec-1927, MRN [id number]  PCP:  Leonides Sake, MD  Cardiologist:   Candee Furbish, MD       History of Present Illness: Clinton Holmes is a 82 y.o. male who presents follow-up mild coronary artery disease on left and right heart catheterization. Echo demonstrated moderate aortic stenosis however catheterization showed no significant gradient. He had ventricular bigeminy/frequent PVCs and was started on amiodarone 200 mg twice a day after discussion with Dr. Lovena Le of electrophysiology. Amiodarone has subsequently been stopped. I cut back his diltiazem to 120 mg once a day.  EDP 19.   08/02/15 he had left eye visual defect. Amiodarone was stopped. Neurologist also was consult it and started him on Plavix and atorvastatin for stroke causing visual field defect.  Currently off of amiodarone he is doing well without any significant palpitations. Much improved. He feels better. He is quite active, working the land. [place]. No significant shortness of breath. Daughter present. Feels well except for visual field deficit as well as disequilibrium.  She reminded me that in the 1990s he had an acoustic neuroma gamma knife procedure at [facility]. Recent MRI shows no evidence of neuroma.     Past Medical History:  Diagnosis Date  . CAD (coronary artery disease)    L&RHC 12/23/2014 elevated LVEDP, prox LAD 45%, 60% ramus  . CKD (chronic kidney disease), stage III   . Essential hypertension   . Hypertension   . Murmur   . OA (osteoarthritis)    a. 2011 s/p bilat TKA.  . Ventricular bigeminy    a. 12/2014. Started on amio on 12/24/2014    Past Surgical History:  Procedure Laterality Date  . CARDIAC CATHETERIZATION N/A 12/23/2014   Procedure: Right/Left Heart Cath and Coronary Angiography;  Surgeon: Belva Crome, MD;  Location: [facility];  Service: Cardiovascular;  Laterality: N/A;  . KNEE SURGERY       Current  Outpatient Prescriptions  Medication Sig Dispense Refill  . aspirin EC 81 MG tablet Take 81 mg by mouth daily.    . clopidogrel (PLAVIX) 75 MG tablet Take 75 mg by mouth daily.    Marland Kitchen diltiazem (CARDIZEM CD) 120 MG 24 hr capsule Take 1 capsule (120 mg total) by mouth daily. 30 capsule 3  . lisinopril (PRINIVIL,ZESTRIL) 40 MG tablet Take 40 mg by mouth daily.    . Multiple Vitamins-Minerals (MULTIVITAMIN ADULTS 50+ PO) Take 1 tablet by mouth daily.     No current facility-administered medications for this visit.     Allergies:   Review of patient's allergies indicates no known allergies.    Social History:  The patient  reports that he has quit smoking. He does not have any smokeless tobacco history on file. He reports that he does not drink alcohol or use drugs.   Family History:  The patient's family history includes CAD in his mother; Congestive Heart Failure in his sister; Heart attack in his father; Other in his brother.    ROS:  Please see the history of present illness.   Otherwise, review of systems are positive for none.   All other systems are reviewed and negative.    PHYSICAL EXAM: VS:  BP (!) 160/90 (Cuff Size: Normal)   Pulse 65   Ht 6' (1.829 m)   Wt 245 lb 1.9 oz (111.2 kg)   BMI 33.24 kg/m  , BMI Body mass index  is 33.24 kg/m. GEN: Well nourished, well developed, in no acute distress, looks younger than stated age  61: normal  Neck: no JVD, carotid bruits, or masses Cardiac: Regular with no ectopy; 2/6 systolic right upper sternal border murmurs,no rubs, or gallops,no edema  Respiratory:  clear to auscultation bilaterally, normal work of breathing GI: soft, nontender, nondistended, + BS MS: no deformity or atrophy  Skin: warm and dry, no rash Neuro:  Strength and sensation are intact, left visual field defect Psych: euthymic mood, full affect   EKG:   The ekg ordered today 03/08/16-sinus rhythm, 65, nonspecific ST-T wave changes, no significant change from  prior personally viewed- 01/05/15-sinus bradycardia rate 54 with 1 PVC noted. QTC 470 ms. When compared to prior, ventricular bigeminy has extinguished.  ECHO: 12/22/14 - Left ventricle: The cavity size was normal. There was moderate concentric hypertrophy. Systolic function was normal. The estimated ejection fraction was in the range of 55% to 60%. Wall motion was normal; there were no regional wall motion abnormalities. ncessant bigeminy limits evaluation of LV diastolic function. Doppler parameters are consistent with elevated mean left atrial filling pressure. - Aortic valve: There was moderate stenosis. There was trivial regurgitation. Valve area (VTI): 1.26 cm^2. Valve area (Vmax): 1.28 cm^2. Valve area (Vmean): 1.15 cm^2. - Mitral valve: There was mild to moderate regurgitation directed eccentrically and posteriorly. - Left atrium: The atrium was severely dilated. - Right ventricle: The cavity size was mildly dilated. Wall thickness was normal. - Pulmonary arteries: Systolic pressure was mildly increased. PA peak pressure: 39 mm Hg (S).  Cardiac catheterization 12/23/14  Widely patent coronary arteries with 45% proximal LAD narrowing. The ramus intermedius branches and the more medial/anterior branch contains a 60% stenosis. No hemodynamically significant coronary lesions were identified.  No evidence of aortic stenosis  Mild pulmonary hypertension  Apparent mild global left ventricular hypokinesis although left ventriculography was of poor quality due to faint opacification.   Recent Labs: 04/04/2015: ALT 13; TSH 1.16    Lipid Panel    Component Value Date/Time   CHOL 150 12/22/2014 0704   TRIG 74 12/22/2014 0704   HDL 28 (L) 12/22/2014 0704   CHOLHDL 5.4 12/22/2014 0704   VLDL 15 12/22/2014 0704   LDLCALC 107 (H) 12/22/2014 0704      Wt Readings from Last 3 Encounters:  03/08/16 245 lb 1.9 oz (111.2 kg)  08/26/15 249 lb 6.4 oz (113.1 kg)    04/04/15 246 lb (111.6 kg)    TSH normal, LFTs normal. BNP 517.  Other studies Reviewed: Additional studies/ records that were reviewed today include: Hospital records, lab work. Review of the above records demonstrates: As above.   ASSESSMENT AND PLAN:  Symptomatic bradycardia as a result of ventricular bigeminy -amiodarone previously helped out significantly however this was stopped after visual field defect noted left eye. Usually, amiodarone causes deposition scattered throughout retina and does not in and of itself cause a well demarcated hemianopsia. He previously had a stroke causing this field defect and perhaps some balance issues as well. Neurology worked this up.  MRI. Agree with Plavix, atorvastatin addition, however he was having side effects on atorvastatin and decided to stop. I described to him the prevention aspect of statin therapy.   Left eye stroke symptoms  - As above  Nonobstructive coronary artery disease -minimal CAD on catheterization.  Obesity -continue to encourage weight loss  Chronic kidney disease stage III.   -Continue with ACE inhibitor.  Essential hypertension -mildly elevated today. Previously  has been under reasonable control. At primary physician's office, it was normal he states. At recent neurology visit it was 0000000 systolic. I do not want to increase his diltiazem because of possibility of bradycardia. Lisinopril is currently 10 mg.   Aortic stenosis -possibly moderate on echocardiogram. No gradient on catheterization. Murmur appreciated. Continue to monitor.  Current medicines are reviewed at length with the patient today.  The patient does not have concerns regarding medicines.  The following changes have been made:  None   Labs/ tests ordered today include:   Orders Placed This Encounter  Procedures  . EKG 12-Lead     Disposition:   FU with Lynnley Doddridge As needed. Please let me know if I can be of further assistance. He has been quite  stable from a cardiac perspective.  Signed, Candee Furbish, MD  03/08/2016 10:27 AM    [facility] [place], [place], [place]  [address] Phone: [phone]; Fax: [phone]

## 2016-03-08 NOTE — Patient Instructions (Signed)
Medication Instructions:  The current medical regimen is effective;  continue present plan and medications.  Follow-Up: Follow up as needed.  Thank you for choosing [facility]!!     

## 2016-03-26 DIAGNOSIS — Z6833 Body mass index (BMI) 33.0-33.9, adult: Secondary | ICD-10-CM | POA: Diagnosis not present

## 2016-03-26 DIAGNOSIS — H47012 Ischemic optic neuropathy, left eye: Secondary | ICD-10-CM | POA: Diagnosis not present

## 2016-03-26 DIAGNOSIS — I10 Essential (primary) hypertension: Secondary | ICD-10-CM | POA: Diagnosis not present

## 2016-03-26 DIAGNOSIS — N183 Chronic kidney disease, stage 3 (moderate): Secondary | ICD-10-CM | POA: Diagnosis not present

## 2016-03-26 DIAGNOSIS — E782 Mixed hyperlipidemia: Secondary | ICD-10-CM | POA: Diagnosis not present

## 2016-03-26 DIAGNOSIS — Z125 Encounter for screening for malignant neoplasm of prostate: Secondary | ICD-10-CM | POA: Diagnosis not present

## 2016-04-25 DIAGNOSIS — H47012 Ischemic optic neuropathy, left eye: Secondary | ICD-10-CM | POA: Diagnosis not present

## 2016-09-27 DIAGNOSIS — Z6833 Body mass index (BMI) 33.0-33.9, adult: Secondary | ICD-10-CM | POA: Diagnosis not present

## 2016-09-27 DIAGNOSIS — Z79899 Other long term (current) drug therapy: Secondary | ICD-10-CM | POA: Diagnosis not present

## 2016-09-27 DIAGNOSIS — Z9181 History of falling: Secondary | ICD-10-CM | POA: Diagnosis not present

## 2016-09-27 DIAGNOSIS — Z1389 Encounter for screening for other disorder: Secondary | ICD-10-CM | POA: Diagnosis not present

## 2016-09-27 DIAGNOSIS — I350 Nonrheumatic aortic (valve) stenosis: Secondary | ICD-10-CM | POA: Diagnosis not present

## 2016-09-27 DIAGNOSIS — I10 Essential (primary) hypertension: Secondary | ICD-10-CM | POA: Diagnosis not present

## 2016-09-27 DIAGNOSIS — E669 Obesity, unspecified: Secondary | ICD-10-CM | POA: Diagnosis not present

## 2016-09-27 DIAGNOSIS — N183 Chronic kidney disease, stage 3 (moderate): Secondary | ICD-10-CM | POA: Diagnosis not present

## 2016-09-27 DIAGNOSIS — M1990 Unspecified osteoarthritis, unspecified site: Secondary | ICD-10-CM | POA: Diagnosis not present

## 2016-09-27 DIAGNOSIS — H47012 Ischemic optic neuropathy, left eye: Secondary | ICD-10-CM | POA: Diagnosis not present

## 2016-09-27 DIAGNOSIS — E782 Mixed hyperlipidemia: Secondary | ICD-10-CM | POA: Diagnosis not present

## 2016-12-11 DIAGNOSIS — J189 Pneumonia, unspecified organism: Secondary | ICD-10-CM | POA: Diagnosis not present

## 2016-12-11 DIAGNOSIS — Z6832 Body mass index (BMI) 32.0-32.9, adult: Secondary | ICD-10-CM | POA: Diagnosis not present

## 2017-01-23 DIAGNOSIS — Z136 Encounter for screening for cardiovascular disorders: Secondary | ICD-10-CM | POA: Diagnosis not present

## 2017-01-23 DIAGNOSIS — Z6832 Body mass index (BMI) 32.0-32.9, adult: Secondary | ICD-10-CM | POA: Diagnosis not present

## 2017-01-23 DIAGNOSIS — Z125 Encounter for screening for malignant neoplasm of prostate: Secondary | ICD-10-CM | POA: Diagnosis not present

## 2017-01-23 DIAGNOSIS — Z1389 Encounter for screening for other disorder: Secondary | ICD-10-CM | POA: Diagnosis not present

## 2017-01-23 DIAGNOSIS — Z0000 Encounter for general adult medical examination without abnormal findings: Secondary | ICD-10-CM | POA: Diagnosis not present

## 2017-01-23 DIAGNOSIS — E785 Hyperlipidemia, unspecified: Secondary | ICD-10-CM | POA: Diagnosis not present

## 2017-01-23 DIAGNOSIS — Z9181 History of falling: Secondary | ICD-10-CM | POA: Diagnosis not present

## 2017-04-30 DIAGNOSIS — H2513 Age-related nuclear cataract, bilateral: Secondary | ICD-10-CM | POA: Diagnosis not present

## 2017-04-30 DIAGNOSIS — H47012 Ischemic optic neuropathy, left eye: Secondary | ICD-10-CM | POA: Diagnosis not present

## 2017-07-09 DIAGNOSIS — I350 Nonrheumatic aortic (valve) stenosis: Secondary | ICD-10-CM | POA: Diagnosis not present

## 2017-07-09 DIAGNOSIS — R55 Syncope and collapse: Secondary | ICD-10-CM | POA: Diagnosis not present

## 2017-07-09 DIAGNOSIS — Z6833 Body mass index (BMI) 33.0-33.9, adult: Secondary | ICD-10-CM | POA: Diagnosis not present

## 2017-07-26 DIAGNOSIS — R55 Syncope and collapse: Secondary | ICD-10-CM | POA: Diagnosis not present

## 2017-07-26 DIAGNOSIS — R0609 Other forms of dyspnea: Secondary | ICD-10-CM | POA: Diagnosis not present

## 2017-07-26 DIAGNOSIS — I10 Essential (primary) hypertension: Secondary | ICD-10-CM | POA: Diagnosis not present

## 2017-07-26 DIAGNOSIS — Z6833 Body mass index (BMI) 33.0-33.9, adult: Secondary | ICD-10-CM | POA: Diagnosis not present

## 2017-07-26 DIAGNOSIS — H47012 Ischemic optic neuropathy, left eye: Secondary | ICD-10-CM | POA: Diagnosis not present

## 2017-08-15 ENCOUNTER — Ambulatory Visit (INDEPENDENT_AMBULATORY_CARE_PROVIDER_SITE_OTHER): Payer: Medicare Other | Admitting: Cardiology

## 2017-08-15 ENCOUNTER — Encounter: Payer: Self-pay | Admitting: Cardiology

## 2017-08-15 VITALS — BP 118/52 | HR 67 | Ht 72.0 in | Wt 242.8 lb

## 2017-08-15 DIAGNOSIS — I350 Nonrheumatic aortic (valve) stenosis: Secondary | ICD-10-CM

## 2017-08-15 DIAGNOSIS — I499 Cardiac arrhythmia, unspecified: Secondary | ICD-10-CM | POA: Diagnosis not present

## 2017-08-15 DIAGNOSIS — R55 Syncope and collapse: Secondary | ICD-10-CM

## 2017-08-15 DIAGNOSIS — I498 Other specified cardiac arrhythmias: Secondary | ICD-10-CM

## 2017-08-15 DIAGNOSIS — I10 Essential (primary) hypertension: Secondary | ICD-10-CM

## 2017-08-15 NOTE — Progress Notes (Signed)
Cardiology Office Note   Date:  08/15/2017   ID:  Clinton Holmes, DOB 02-Mar-1928, MRN [id number]  PCP:  Isaias Sakai, DO  Cardiologist:   Candee Furbish, MD       History of Present Illness: Clinton Holmes is a 84 y.o. male who presents follow-up mild coronary artery disease on left and right heart catheterization. Echo demonstrated moderate aortic stenosis however catheterization showed no significant gradient. He had ventricular bigeminy/frequent PVCs and was started on amiodarone 200 mg twice a day after discussion with Dr. Lovena Le of electrophysiology. Amiodarone has subsequently been stopped. I cut back his diltiazem to 120 mg once a day.  EDP 19.   08/02/15 he had left eye visual defect. Amiodarone was stopped. Neurologist also was consult it and started him on Plavix and atorvastatin for stroke causing visual field cut defect.  She reminded me that in the 1990s he had an acoustic neuroma gamma knife procedure at [facility]. Recent MRI shows no evidence of neuroma.  Over the last year or so he has had a few episodes of falling/syncope.  They all have had warning signs, feeling tired, fatigued, lightheaded, sweaty and nauseous.  One-time mustering pot on the stove on Christmas eve he started to feel this way and try to make his way over to the reclining chair but did not make it and fell.  He also had an episode last spring or after getting out of the shower he was shaving at the sink and fell/passed out again.  Once again, there is a prodrome.   Past Medical History:  Diagnosis Date  . CAD (coronary artery disease)    L&RHC 12/23/2014 elevated LVEDP, prox LAD 45%, 60% ramus  . CKD (chronic kidney disease), stage III ([place])   . Essential hypertension   . Hypertension   . Murmur   . OA (osteoarthritis)    a. 2011 s/p bilat TKA.  . Ventricular bigeminy    a. 12/2014. Started on amio on 12/24/2014    Past Surgical History:  Procedure Laterality Date  . CARDIAC  CATHETERIZATION N/A 12/23/2014   Procedure: Right/Left Heart Cath and Coronary Angiography;  Surgeon: Belva Crome, MD;  Location: [facility];  Service: Cardiovascular;  Laterality: N/A;  . KNEE SURGERY       Current Outpatient Medications  Medication Sig Dispense Refill  . aspirin EC 81 MG tablet Take 81 mg by mouth daily.    . clopidogrel (PLAVIX) 75 MG tablet Take 75 mg by mouth daily.    Marland Kitchen diltiazem (CARDIZEM CD) 120 MG 24 hr capsule Take 1 capsule (120 mg total) by mouth daily. 30 capsule 3  . Docusate Calcium (STOOL SOFTENER PO) Take 1 tablet by mouth daily.    Marland Kitchen lisinopril (PRINIVIL,ZESTRIL) 40 MG tablet Take 40 mg by mouth daily.    . Multiple Vitamins-Minerals (MULTIVITAMIN ADULTS 50+ PO) Take 1 tablet by mouth daily.     No current facility-administered medications for this visit.     Allergies:   Patient has no known allergies.    Social History:  The patient  reports that he has quit smoking. he has never used smokeless tobacco. He reports that he does not drink alcohol or use drugs.   Family History:  The patient's family history includes CAD in his mother; Congestive Heart Failure in his sister; Heart attack in his father; Other in his brother.    ROS:  Please see the history of present illness.  Otherwise, review of systems are positive for none.   All other systems are reviewed and negative.    PHYSICAL EXAM: VS:  BP (!) 118/52   Pulse 67   Ht 6' (1.829 m)   Wt 242 lb 12.8 oz (110.1 kg)   SpO2 97%   BMI 32.93 kg/m  , BMI Body mass index is 32.93 kg/m. GEN: Well nourished, well developed, in no acute distress  HEENT: normal  Neck: no JVD, carotid bruits, or masses Cardiac: RRR; 2/6 SM, no rubs, or gallops,no edema  Respiratory:  clear to auscultation bilaterally, normal work of breathing GI: soft, nontender, nondistended, + BS MS: no deformity or atrophy  Skin: warm and dry, no rash Neuro:  Alert and Oriented x 3, Strength and sensation are  intact, uses a walker Psych: euthymic mood, full affect   EKG:   The ekg ordered today 03/08/16-sinus rhythm, 65, nonspecific ST-T wave changes, no significant change from prior personally viewed- 01/05/15-sinus bradycardia rate 54 with 1 PVC noted. QTC 470 ms. When compared to prior, ventricular bigeminy has extinguished.  ECHO: 12/22/14 - Left ventricle: The cavity size was normal. There was moderate concentric hypertrophy. Systolic function was normal. The estimated ejection fraction was in the range of 55% to 60%. Wall motion was normal; there were no regional wall motion abnormalities. ncessant bigeminy limits evaluation of LV diastolic function. Doppler parameters are consistent with elevated mean left atrial filling pressure. - Aortic valve: There was moderate stenosis. There was trivial regurgitation. Valve area (VTI): 1.26 cm^2. Valve area (Vmax): 1.28 cm^2. Valve area (Vmean): 1.15 cm^2. - Mitral valve: There was mild to moderate regurgitation directed eccentrically and posteriorly. - Left atrium: The atrium was severely dilated. - Right ventricle: The cavity size was mildly dilated. Wall thickness was normal. - Pulmonary arteries: Systolic pressure was mildly increased. PA peak pressure: 39 mm Hg (S).  Cardiac catheterization 12/23/14  Widely patent coronary arteries with 45% proximal LAD narrowing. The ramus intermedius branches and the more medial/anterior branch contains a 60% stenosis. No hemodynamically significant coronary lesions were identified.  No evidence of aortic stenosis  Mild pulmonary hypertension  Apparent mild global left ventricular hypokinesis although left ventriculography was of poor quality due to faint opacification.   Recent Labs: No results found for requested labs within last 8760 hours.    Lipid Panel    Component Value Date/Time   CHOL 150 12/22/2014 0704   TRIG 74 12/22/2014 0704   HDL 28 (L) 12/22/2014 0704    CHOLHDL 5.4 12/22/2014 0704   VLDL 15 12/22/2014 0704   LDLCALC 107 (H) 12/22/2014 0704      Wt Readings from Last 3 Encounters:  08/15/17 242 lb 12.8 oz (110.1 kg)  03/08/16 245 lb 1.9 oz (111.2 kg)  08/26/15 249 lb 6.4 oz (113.1 kg)    TSH normal, LFTs normal. BNP 517.  Other studies Reviewed: Additional studies/ records that were reviewed today include: Hospital records, lab work. Review of the above records demonstrates: As above.   ASSESSMENT AND PLAN:  Syncope -He has had a prodrome with these episodes which are suggestive of vagal syncope, nausea etc. he knows to try to lay down if he starts to feel this way.  His blood pressure was 118/52 today, because of this, we will discontinue his lisinopril 40 mg.  I do not want to have his blood pressure too low which can precipitate his syncopal episodes.  I will also check another echocardiogram to ensure there is been  no significant change.  His EKG demonstrates frequent PVCs, quadrigeminy pattern mostly.  No pauses.  We will continue with Cardizem.   Symptomatic bradycardia as a result of ventricular bigeminy -amiodarone previously helped out significantly however this was stopped after visual field defect noted left eye. Usually, amiodarone causes deposition scattered throughout retina and does not in and of itself cause a well demarcated hemianopsia. He previously had a stroke causing this field defect and perhaps some balance issues as well. Neurology worked this up.  MRI. Agree with Plavix and we will stop his aspirin 81, atorvastatin addition, however he was having side effects on atorvastatin and decided to stop. I described to him the prevention aspect of statin therapy.   Left eye stroke symptoms  - As above, continuing Plavix but stopping low-dose aspirin to reduce bleeding side effects.  Nonobstructive coronary artery disease -minimal CAD on catheterization.  Obesity -continue to encourage weight loss  Chronic kidney  disease stage III.   -Continue with ACE inhibitor.  Essential hypertension -mildly elevated today. Previously has been under reasonable control. At primary physician's office, it was normal he states. At recent neurology visit it was 8:84 systolic. I do not want to increase his diltiazem because of possibility of bradycardia. Lisinopril is currently 10 mg.   Aortic stenosis -possibly moderate on echocardiogram. No gradient on catheterization. Murmur appreciated. Continue to monitor.  Current medicines are reviewed at length with the patient today.  The patient does not have concerns regarding medicines.  The following changes have been made:  None   Labs/ tests ordered today include:   No orders of the defined types were placed in this encounter.    Disposition:   FU with [place] As needed. Please let me know if I can be of further assistance. He has been quite stable from a cardiac perspective.  Signed, Candee Furbish, MD  08/15/2017 12:17 PM    [facility] [place], [place], [place]  [address] Phone: [phone]; Fax: [phone]

## 2017-08-15 NOTE — Patient Instructions (Signed)
Medication Instructions:  Please discontinue your Asprin and Lisinopril.  Testing/Procedures: Your physician has requested that you have an echocardiogram. Echocardiography is a painless test that uses sound waves to create images of your heart. It provides your doctor with information about the size and shape of your heart and how well your heart's chambers and valves are working. This procedure takes approximately one hour. There are no restrictions for this procedure.  Follow-Up: Follow up in 3 months with Dr Marlou Porch.  If you need a refill on your cardiac medications before your next appointment, please call your pharmacy.  Thank you for choosing [facility]!!

## 2017-08-19 ENCOUNTER — Other Ambulatory Visit: Payer: Self-pay

## 2017-08-19 ENCOUNTER — Ambulatory Visit (HOSPITAL_COMMUNITY): Payer: Medicare Other | Attending: Cardiology

## 2017-08-19 DIAGNOSIS — I119 Hypertensive heart disease without heart failure: Secondary | ICD-10-CM | POA: Insufficient documentation

## 2017-08-19 DIAGNOSIS — R55 Syncope and collapse: Secondary | ICD-10-CM | POA: Insufficient documentation

## 2017-08-19 DIAGNOSIS — I429 Cardiomyopathy, unspecified: Secondary | ICD-10-CM | POA: Diagnosis not present

## 2017-08-19 DIAGNOSIS — I350 Nonrheumatic aortic (valve) stenosis: Secondary | ICD-10-CM | POA: Diagnosis not present

## 2017-08-19 DIAGNOSIS — I2510 Atherosclerotic heart disease of native coronary artery without angina pectoris: Secondary | ICD-10-CM | POA: Insufficient documentation

## 2017-08-19 DIAGNOSIS — I10 Essential (primary) hypertension: Secondary | ICD-10-CM | POA: Diagnosis not present

## 2017-09-11 ENCOUNTER — Ambulatory Visit: Payer: Medicare Other | Admitting: Cardiology

## 2017-11-25 ENCOUNTER — Encounter: Payer: Self-pay | Admitting: Cardiology

## 2017-11-25 ENCOUNTER — Ambulatory Visit (INDEPENDENT_AMBULATORY_CARE_PROVIDER_SITE_OTHER): Payer: Medicare Other | Admitting: Cardiology

## 2017-11-25 VITALS — BP 148/90 | HR 64 | Ht 72.0 in | Wt 232.0 lb

## 2017-11-25 DIAGNOSIS — I10 Essential (primary) hypertension: Secondary | ICD-10-CM

## 2017-11-25 DIAGNOSIS — I350 Nonrheumatic aortic (valve) stenosis: Secondary | ICD-10-CM | POA: Diagnosis not present

## 2017-11-25 DIAGNOSIS — R55 Syncope and collapse: Secondary | ICD-10-CM

## 2017-11-25 MED ORDER — CLOPIDOGREL BISULFATE 75 MG PO TABS: 75.0000 mg | ORAL_TABLET | Freq: Every day | ORAL | 3 refills | Status: DC

## 2017-11-25 NOTE — Patient Instructions (Signed)

## 2017-11-25 NOTE — Progress Notes (Signed)
Cardiology Office Note   Date:  11/25/2017   ID:  Clinton Holmes, DOB 1927-12-05, MRN [id number]  PCP:  Clinton Sakai, DO  Cardiologist:   Clinton Furbish, MD    History of Present Illness: Clinton Holmes is a 84 y.o. male who presents follow-up mild coronary artery disease on left and right heart catheterization. Echo demonstrated moderate aortic stenosis however catheterization showed no significant gradient. He had ventricular bigeminy/frequent PVCs and was started on amiodarone 200 mg twice a day after discussion with Dr. Lovena Le of electrophysiology. Amiodarone has subsequently been stopped. I cut back his diltiazem to 120 mg once a day.  EDP 19.   08/02/15 he had left eye visual defect. Amiodarone was stopped. Neurologist also was consult it and started him on Plavix and atorvastatin for stroke causing visual field cut defect.  He reminded me that in the 1990s he had an acoustic neuroma gamma knife procedure at [facility]. Recent MRI shows no evidence of neuroma.  Over the last year or so he has had a few episodes of falling/syncope.  They all have had warning signs, feeling tired, fatigued, lightheaded, sweaty and nauseous.  One-time mustering pot on the stove on Christmas eve he started to feel this way and try to make his way over to the reclining chair but did not make it and fell.  He also had an episode last spring or after getting out of the shower he was shaving at the sink and fell/passed out again.  Once again, there is a prodrome.  11/25/17-using a walker for ambulation.  Disequilibrium is his main complaint.  He is having no symptoms from his aortic stenosis which is categorized as moderate.  No bleeding, no syncope.  No chest pain.  Here with his family members.  He has visual issues.   Past Medical History:  Diagnosis Date  . CAD (coronary artery disease)    L&RHC 12/23/2014 elevated LVEDP, prox LAD 45%, 60% ramus  . CKD (chronic kidney disease), stage III ([place])   .  Essential hypertension   . Hypertension   . Murmur   . OA (osteoarthritis)    a. 2011 s/p bilat TKA.  . Ventricular bigeminy    a. 12/2014. Started on amio on 12/24/2014    Past Surgical History:  Procedure Laterality Date  . CARDIAC CATHETERIZATION N/A 12/23/2014   Procedure: Right/Left Heart Cath and Coronary Angiography;  Surgeon: Belva Crome, MD;  Location: [facility];  Service: Cardiovascular;  Laterality: N/A;  . KNEE SURGERY       Current Outpatient Medications  Medication Sig Dispense Refill  . diltiazem (CARDIZEM CD) 120 MG 24 hr capsule Take 1 capsule (120 mg total) by mouth daily. 30 capsule 3  . Docusate Calcium (STOOL SOFTENER PO) Take 1 tablet by mouth daily.    Marland Kitchen lisinopril (PRINIVIL,ZESTRIL) 40 MG tablet Take 1 tablet by mouth daily.    . Multiple Vitamins-Minerals (MULTIVITAMIN ADULTS 50+ PO) Take 1 tablet by mouth daily.    . clopidogrel (PLAVIX) 75 MG tablet Take 1 tablet (75 mg total) by mouth daily. 90 tablet 3   No current facility-administered medications for this visit.     Allergies:   Patient has no known allergies.    Social History:  The patient  reports that he has quit smoking. He has never used smokeless tobacco. He reports that he does not drink alcohol or use drugs.   Family History:  The patient's family history includes  CAD in his mother; Congestive Heart Failure in his sister; Heart attack in his father; Other in his brother.    ROS:  Please see the history of present illness.   Otherwise, review of systems are positive for none.   All other systems are reviewed and negative.    PHYSICAL EXAM: VS:  BP (!) 148/90   Pulse 64   Ht 6' (1.829 m)   Wt 232 lb (105.2 kg)   BMI 31.46 kg/m  , BMI Body mass index is 31.46 kg/m. GEN: Well nourished, well developed, in no acute distress, walker for ambulation.  HEENT: normal  Neck: no JVD, carotid bruits, or masses Cardiac: RRR; 2/6 SM, no rubs, or gallops,no edema  Respiratory:  clear  to auscultation bilaterally, normal work of breathing GI: soft, nontender, nondistended, + BS MS: no deformity or atrophy  Skin: warm and dry, no rash Neuro:  Alert and Oriented x 3, Strength and sensation are intact Psych: euthymic mood, full affect    EKG:   The ekg ordered today 03/08/16-sinus rhythm, 65, nonspecific ST-T wave changes, no significant change from prior personally viewed- 01/05/15-sinus bradycardia rate 54 with 1 PVC noted. QTC 470 ms. When compared to prior, ventricular bigeminy has extinguished.  ECHO: 12/22/14 - Left ventricle: The cavity size was normal. There was moderate concentric hypertrophy. Systolic function was normal. The estimated ejection fraction was in the range of 55% to 60%. Wall motion was normal; there were no regional wall motion abnormalities. ncessant bigeminy limits evaluation of LV diastolic function. Doppler parameters are consistent with elevated mean left atrial filling pressure. - Aortic valve: There was moderate stenosis. There was trivial regurgitation. Valve area (VTI): 1.26 cm^2. Valve area (Vmax): 1.28 cm^2. Valve area (Vmean): 1.15 cm^2. - Mitral valve: There was mild to moderate regurgitation directed eccentrically and posteriorly. - Left atrium: The atrium was severely dilated. - Right ventricle: The cavity size was mildly dilated. Wall thickness was normal. - Pulmonary arteries: Systolic pressure was mildly increased. PA peak pressure: 39 mm Hg (S).  Cardiac catheterization 12/23/14  Widely patent coronary arteries with 45% proximal LAD narrowing. The ramus intermedius branches and the more medial/anterior branch contains a 60% stenosis. No hemodynamically significant coronary lesions were identified.  No evidence of aortic stenosis  Mild pulmonary hypertension  Apparent mild global left ventricular hypokinesis although left ventriculography was of poor quality due to faint opacification.   Recent  Labs: No results found for requested labs within last 8760 hours.    Lipid Panel    Component Value Date/Time   CHOL 150 12/22/2014 0704   TRIG 74 12/22/2014 0704   HDL 28 (L) 12/22/2014 0704   CHOLHDL 5.4 12/22/2014 0704   VLDL 15 12/22/2014 0704   LDLCALC 107 (H) 12/22/2014 0704      Wt Readings from Last 3 Encounters:  11/25/17 232 lb (105.2 kg)  08/15/17 242 lb 12.8 oz (110.1 kg)  03/08/16 245 lb 1.9 oz (111.2 kg)    TSH normal, LFTs normal. BNP 517.  Other studies Reviewed: Additional studies/ records that were reviewed today include: Hospital records, lab work. Review of the above records demonstrates: As above.  Echocardiogram 08/19/17: Normal EF, LVH present Moderate aortic stenosis as was previously seen on prior echocardiogram  Overall no change.   ASSESSMENT AND PLAN:  Syncope -No new issues.  He has not had an episode in quite some time. -Previously had a prodrome with these episodes which are suggestive of vagal syncope, nausea etc. he  knows to try to lay down if he starts to feel this way.  At a prior visit with his blood pressure being in the 118 range, we stopped his lisinopril but it increased significantly and he is back on his lisinopril 40 mg.  I do not want to have his blood pressure too low which can precipitate his syncopal episodes.   His prior EKG demonstrates frequent PVCs, quadrigeminy pattern mostly.  No pauses.  We will continue with Cardizem.  PVCs - Continue with Cardizem  Left eye stroke symptoms  - As above, continuing Plavix but stopping low-dose aspirin to reduce bleeding side effects.  No changes made - We discussed statin therapy.  There was hesitancy by family to utilize.  They will think about this.  Nonobstructive coronary artery disease -minimal CAD on catheterization.  No anginal symptoms  Obesity -continue to encourage weight loss, good job with weight loss  Chronic kidney disease stage III.   -Continue with ACE inhibitor.   Last creatinine 1.3  Essential hypertension -mildly elevated today. Previously has been under reasonable control.  No changes made  Aortic stenosis -moderate on echocardiogram. No gradient on catheterization. Murmur appreciated. Continue to monitor.  No indications for surgery  Current medicines are reviewed at length with the patient today.  The patient does not have concerns regarding medicines.  The following changes have been made:  None   Labs/ tests ordered today include:   No orders of the defined types were placed in this encounter.    Disposition:   1 year follow up.  Signed, Clinton Furbish, MD  11/25/2017 11:44 AM    [facility] [place], [place], [place]  [address] Phone: [phone]; Fax: [phone]

## 2018-01-24 DIAGNOSIS — Z1331 Encounter for screening for depression: Secondary | ICD-10-CM | POA: Diagnosis not present

## 2018-01-24 DIAGNOSIS — Z9181 History of falling: Secondary | ICD-10-CM | POA: Diagnosis not present

## 2018-01-24 DIAGNOSIS — Z136 Encounter for screening for cardiovascular disorders: Secondary | ICD-10-CM | POA: Diagnosis not present

## 2018-01-24 DIAGNOSIS — Z0000 Encounter for general adult medical examination without abnormal findings: Secondary | ICD-10-CM | POA: Diagnosis not present

## 2018-01-24 DIAGNOSIS — Z6832 Body mass index (BMI) 32.0-32.9, adult: Secondary | ICD-10-CM | POA: Diagnosis not present

## 2018-01-24 DIAGNOSIS — E785 Hyperlipidemia, unspecified: Secondary | ICD-10-CM | POA: Diagnosis not present

## 2018-01-24 DIAGNOSIS — Z139 Encounter for screening, unspecified: Secondary | ICD-10-CM | POA: Diagnosis not present

## 2018-01-24 DIAGNOSIS — E669 Obesity, unspecified: Secondary | ICD-10-CM | POA: Diagnosis not present

## 2018-02-11 DIAGNOSIS — Z125 Encounter for screening for malignant neoplasm of prostate: Secondary | ICD-10-CM | POA: Diagnosis not present

## 2018-02-11 DIAGNOSIS — N183 Chronic kidney disease, stage 3 (moderate): Secondary | ICD-10-CM | POA: Diagnosis not present

## 2018-02-11 DIAGNOSIS — I350 Nonrheumatic aortic (valve) stenosis: Secondary | ICD-10-CM | POA: Diagnosis not present

## 2018-02-11 DIAGNOSIS — Z1339 Encounter for screening examination for other mental health and behavioral disorders: Secondary | ICD-10-CM | POA: Diagnosis not present

## 2018-02-11 DIAGNOSIS — R55 Syncope and collapse: Secondary | ICD-10-CM | POA: Diagnosis not present

## 2018-02-11 DIAGNOSIS — E782 Mixed hyperlipidemia: Secondary | ICD-10-CM | POA: Diagnosis not present

## 2018-04-09 DIAGNOSIS — H2513 Age-related nuclear cataract, bilateral: Secondary | ICD-10-CM | POA: Diagnosis not present

## 2018-04-09 DIAGNOSIS — H47012 Ischemic optic neuropathy, left eye: Secondary | ICD-10-CM | POA: Diagnosis not present

## 2018-04-22 DIAGNOSIS — H2513 Age-related nuclear cataract, bilateral: Secondary | ICD-10-CM | POA: Diagnosis not present

## 2018-07-24 ENCOUNTER — Other Ambulatory Visit: Payer: Self-pay

## 2018-07-24 ENCOUNTER — Inpatient Hospital Stay
Admission: EM | Admit: 2018-07-24 | Discharge: 2018-07-26 | DRG: 291 | Disposition: A | Discharge status: Home Health | Payer: Medicare Other | Attending: Internal Medicine | Admitting: Internal Medicine

## 2018-07-24 ENCOUNTER — Encounter: Payer: Self-pay | Admitting: Emergency Medicine

## 2018-07-24 ENCOUNTER — Emergency Department: Payer: Medicare Other

## 2018-07-24 DIAGNOSIS — Z96653 Presence of artificial knee joint, bilateral: Secondary | ICD-10-CM | POA: Diagnosis present

## 2018-07-24 DIAGNOSIS — I351 Nonrheumatic aortic (valve) insufficiency: Secondary | ICD-10-CM | POA: Diagnosis not present

## 2018-07-24 DIAGNOSIS — I493 Ventricular premature depolarization: Secondary | ICD-10-CM

## 2018-07-24 DIAGNOSIS — I5031 Acute diastolic (congestive) heart failure: Secondary | ICD-10-CM | POA: Diagnosis not present

## 2018-07-24 DIAGNOSIS — I5023 Acute on chronic systolic (congestive) heart failure: Secondary | ICD-10-CM | POA: Diagnosis not present

## 2018-07-24 DIAGNOSIS — I350 Nonrheumatic aortic (valve) stenosis: Secondary | ICD-10-CM | POA: Diagnosis present

## 2018-07-24 DIAGNOSIS — N179 Acute kidney failure, unspecified: Secondary | ICD-10-CM | POA: Diagnosis present

## 2018-07-24 DIAGNOSIS — R531 Weakness: Secondary | ICD-10-CM | POA: Diagnosis not present

## 2018-07-24 DIAGNOSIS — N183 Chronic kidney disease, stage 3 (moderate): Secondary | ICD-10-CM | POA: Diagnosis present

## 2018-07-24 DIAGNOSIS — D631 Anemia in chronic kidney disease: Secondary | ICD-10-CM | POA: Diagnosis present

## 2018-07-24 DIAGNOSIS — Z7902 Long term (current) use of antithrombotics/antiplatelets: Secondary | ICD-10-CM | POA: Diagnosis not present

## 2018-07-24 DIAGNOSIS — R0600 Dyspnea, unspecified: Secondary | ICD-10-CM

## 2018-07-24 DIAGNOSIS — I160 Hypertensive urgency: Secondary | ICD-10-CM | POA: Diagnosis present

## 2018-07-24 DIAGNOSIS — Z79899 Other long term (current) drug therapy: Secondary | ICD-10-CM | POA: Diagnosis not present

## 2018-07-24 DIAGNOSIS — I10 Essential (primary) hypertension: Secondary | ICD-10-CM | POA: Diagnosis not present

## 2018-07-24 DIAGNOSIS — I25118 Atherosclerotic heart disease of native coronary artery with other forms of angina pectoris: Secondary | ICD-10-CM | POA: Diagnosis not present

## 2018-07-24 DIAGNOSIS — R0603 Acute respiratory distress: Secondary | ICD-10-CM

## 2018-07-24 DIAGNOSIS — Z87891 Personal history of nicotine dependence: Secondary | ICD-10-CM

## 2018-07-24 DIAGNOSIS — I248 Other forms of acute ischemic heart disease: Secondary | ICD-10-CM | POA: Diagnosis present

## 2018-07-24 DIAGNOSIS — I2510 Atherosclerotic heart disease of native coronary artery without angina pectoris: Secondary | ICD-10-CM | POA: Diagnosis present

## 2018-07-24 DIAGNOSIS — R079 Chest pain, unspecified: Secondary | ICD-10-CM | POA: Diagnosis not present

## 2018-07-24 DIAGNOSIS — I499 Cardiac arrhythmia, unspecified: Secondary | ICD-10-CM | POA: Diagnosis not present

## 2018-07-24 DIAGNOSIS — R0602 Shortness of breath: Secondary | ICD-10-CM | POA: Diagnosis not present

## 2018-07-24 DIAGNOSIS — I130 Hypertensive heart and chronic kidney disease with heart failure and stage 1 through stage 4 chronic kidney disease, or unspecified chronic kidney disease: Secondary | ICD-10-CM | POA: Diagnosis not present

## 2018-07-24 DIAGNOSIS — I509 Heart failure, unspecified: Secondary | ICD-10-CM

## 2018-07-24 DIAGNOSIS — I5041 Acute combined systolic (congestive) and diastolic (congestive) heart failure: Secondary | ICD-10-CM | POA: Diagnosis present

## 2018-07-24 DIAGNOSIS — I5033 Acute on chronic diastolic (congestive) heart failure: Secondary | ICD-10-CM | POA: Diagnosis not present

## 2018-07-24 LAB — LIPID PANEL
Cholesterol: 148 mg/dL (ref 0–200)
HDL: 37 mg/dL — ABNORMAL LOW (ref >40)
LDL Cholesterol: 103 mg/dL — ABNORMAL HIGH (ref 0–99)
Total CHOL/HDL Ratio: 4 RATIO
Triglycerides: 41 mg/dL (ref <150)
VLDL: 8 mg/dL (ref 0–40)

## 2018-07-24 LAB — CREATININE, SERUM
Creatinine, Ser: 1.37 mg/dL — ABNORMAL HIGH (ref 0.61–1.24)
GFR calc Af Amer: 53 mL/min — ABNORMAL LOW (ref >60)
GFR calc non Af Amer: 46 mL/min — ABNORMAL LOW (ref >60)

## 2018-07-24 LAB — TSH: TSH: 1.368 u[IU]/mL (ref 0.350–4.500)

## 2018-07-24 LAB — CBC
HCT: 42.4 % (ref 39.0–52.0)
Hemoglobin: 13.6 g/dL (ref 13.0–17.0)
MCH: 28.8 pg (ref 26.0–34.0)
MCHC: 32.1 g/dL (ref 30.0–36.0)
MCV: 89.8 fL (ref 80.0–100.0)
Platelets: 200 10*3/uL (ref 150–400)
RBC: 4.72 MIL/uL (ref 4.22–5.81)
RDW: 14.2 % (ref 11.5–15.5)
WBC: 9.1 10*3/uL (ref 4.0–10.5)
nRBC: 0 % (ref 0.0–0.2)

## 2018-07-24 LAB — BRAIN NATRIURETIC PEPTIDE: B Natriuretic Peptide: 1345 pg/mL — ABNORMAL HIGH (ref 0.0–100.0)

## 2018-07-24 LAB — CBC WITH DIFFERENTIAL/PLATELET
ABS IMMATURE GRANULOCYTES: 0.06 10*3/uL (ref 0.00–0.07)
BASOS ABS: 0.1 10*3/uL (ref 0.0–0.1)
BASOS PCT: 1 %
Eosinophils Absolute: 0.1 10*3/uL (ref 0.0–0.5)
Eosinophils Relative: 1 %
HEMATOCRIT: 43.7 % (ref 39.0–52.0)
Hemoglobin: 14 g/dL (ref 13.0–17.0)
Immature Granulocytes: 1 %
LYMPHS ABS: 1 10*3/uL (ref 0.7–4.0)
Lymphocytes Relative: 9 %
MCH: 28.9 pg (ref 26.0–34.0)
MCHC: 32 g/dL (ref 30.0–36.0)
MCV: 90.3 fL (ref 80.0–100.0)
Monocytes Absolute: 0.5 10*3/uL (ref 0.1–1.0)
Monocytes Relative: 5 %
Neutro Abs: 8.6 10*3/uL — ABNORMAL HIGH (ref 1.7–7.7)
Neutrophils Relative %: 83 %
Platelets: 206 10*3/uL (ref 150–400)
RBC: 4.84 MIL/uL (ref 4.22–5.81)
RDW: 14.4 % (ref 11.5–15.5)
WBC: 10.2 10*3/uL (ref 4.0–10.5)
nRBC: 0 % (ref 0.0–0.2)

## 2018-07-24 LAB — TROPONIN I
Troponin I: 0.03 ng/mL (ref <0.03)
Troponin I: 0.04 ng/mL (ref <0.03)
Troponin I: 0.05 ng/mL (ref <0.03)

## 2018-07-24 LAB — PROTIME-INR
INR: 1.21
PROTHROMBIN TIME: 15.2 s (ref 11.4–15.2)

## 2018-07-24 LAB — MAGNESIUM: Magnesium: 2 mg/dL (ref 1.7–2.4)

## 2018-07-24 LAB — APTT: aPTT: 33 seconds (ref 24–36)

## 2018-07-24 MED ORDER — NITROGLYCERIN 2 % TD OINT
1.0000 [in_us] | TOPICAL_OINTMENT | Freq: Once | TRANSDERMAL | Status: AC
  Administered 2018-07-24: 1 [in_us] via TOPICAL
  Filled 2018-07-24: qty 1

## 2018-07-24 MED ORDER — FUROSEMIDE 10 MG/ML IJ SOLN
60.0000 mg | Freq: Once | INTRAMUSCULAR | Status: AC
  Administered 2018-07-24: 60 mg via INTRAVENOUS
  Filled 2018-07-24: qty 8

## 2018-07-24 MED ORDER — ACETAMINOPHEN 325 MG PO TABS: 650.0000 mg | ORAL_TABLET | Freq: Four times a day (QID) | ORAL | Status: DC | PRN

## 2018-07-24 MED ORDER — HEPARIN BOLUS VIA INFUSION
4000.0000 [IU] | Freq: Once | INTRAVENOUS | Status: AC
  Administered 2018-07-24: 4000 [IU] via INTRAVENOUS
  Filled 2018-07-24: qty 4000

## 2018-07-24 MED ORDER — DOXAZOSIN MESYLATE 2 MG PO TABS
2.0000 mg | ORAL_TABLET | Freq: Two times a day (BID) | ORAL | Status: DC
  Administered 2018-07-24 – 2018-07-26 (×4): 2 mg via ORAL
  Filled 2018-07-24 (×7): qty 1

## 2018-07-24 MED ORDER — ACETAMINOPHEN 650 MG RE SUPP: 650.0000 mg | Freq: Four times a day (QID) | RECTAL | Status: DC | PRN

## 2018-07-24 MED ORDER — ENOXAPARIN SODIUM 40 MG/0.4ML ~~LOC~~ SOLN: 40.0000 mg | SUBCUTANEOUS | Status: DC

## 2018-07-24 MED ORDER — CLOPIDOGREL BISULFATE 75 MG PO TABS
75.0000 mg | ORAL_TABLET | Freq: Every day | ORAL | Status: DC
  Administered 2018-07-25 – 2018-07-26 (×2): 75 mg via ORAL
  Filled 2018-07-24 (×2): qty 1

## 2018-07-24 MED ORDER — POLYETHYLENE GLYCOL 3350 17 G PO PACK: 17.0000 g | PACK | Freq: Every day | ORAL | Status: DC | PRN

## 2018-07-24 MED ORDER — ADULT MULTIVITAMIN W/MINERALS CH
ORAL_TABLET | Freq: Every day | ORAL | Status: DC
  Administered 2018-07-24 – 2018-07-25 (×2): 1 via ORAL
  Filled 2018-07-24 (×2): qty 1

## 2018-07-24 MED ORDER — ALBUTEROL SULFATE (2.5 MG/3ML) 0.083% IN NEBU: 2.5000 mg | INHALATION_SOLUTION | RESPIRATORY_TRACT | Status: DC | PRN

## 2018-07-24 MED ORDER — FUROSEMIDE 10 MG/ML IJ SOLN
40.0000 mg | Freq: Two times a day (BID) | INTRAMUSCULAR | Status: DC
  Administered 2018-07-24 – 2018-07-25 (×2): 40 mg via INTRAVENOUS
  Filled 2018-07-24 (×2): qty 4

## 2018-07-24 MED ORDER — DILTIAZEM HCL ER COATED BEADS 120 MG PO CP24
120.0000 mg | ORAL_CAPSULE | Freq: Every day | ORAL | Status: DC
  Administered 2018-07-25: 120 mg via ORAL
  Filled 2018-07-24: qty 1

## 2018-07-24 MED ORDER — LISINOPRIL 20 MG PO TABS
40.0000 mg | ORAL_TABLET | Freq: Every day | ORAL | Status: DC
  Administered 2018-07-25 – 2018-07-26 (×2): 40 mg via ORAL
  Filled 2018-07-24 (×2): qty 2

## 2018-07-24 MED ORDER — SODIUM CHLORIDE 0.9% FLUSH
3.0000 mL | INTRAVENOUS | Status: DC | PRN
  Administered 2018-07-24: 3 mL via INTRAVENOUS

## 2018-07-24 MED ORDER — HYDRALAZINE HCL 25 MG PO TABS: 25.0000 mg | ORAL_TABLET | Freq: Four times a day (QID) | ORAL | Status: DC | PRN

## 2018-07-24 MED ORDER — SODIUM CHLORIDE 0.9% FLUSH
3.0000 mL | Freq: Two times a day (BID) | INTRAVENOUS | Status: DC
  Administered 2018-07-24 – 2018-07-25 (×3): 3 mL via INTRAVENOUS

## 2018-07-24 MED ORDER — ONDANSETRON HCL 4 MG PO TABS: 4.0000 mg | ORAL_TABLET | Freq: Four times a day (QID) | ORAL | Status: DC | PRN

## 2018-07-24 MED ORDER — HEPARIN (PORCINE) 25000 UT/250ML-% IV SOLN
1300.0000 [IU]/h | INTRAVENOUS | Status: DC
  Administered 2018-07-24: 1300 [IU]/h via INTRAVENOUS
  Filled 2018-07-24: qty 250

## 2018-07-24 MED ORDER — ONDANSETRON HCL 4 MG/2ML IJ SOLN: 4.0000 mg | Freq: Four times a day (QID) | INTRAMUSCULAR | Status: DC | PRN

## 2018-07-24 MED ORDER — ASPIRIN 81 MG PO CHEW
324.0000 mg | CHEWABLE_TABLET | Freq: Once | ORAL | Status: AC
  Administered 2018-07-24: 324 mg via ORAL
  Filled 2018-07-24: qty 4

## 2018-07-24 NOTE — Progress Notes (Signed)
Patient admitted from the ER with SOB, and lower leg edema.    Schedule for BID diuresis.

## 2018-07-24 NOTE — ED Notes (Signed)
Date and time results received: 07/24/18 12:42 PM    Test: troponin Critical Value: 0.03  Name of Provider Notified: Burlene Arnt

## 2018-07-24 NOTE — ED Provider Notes (Addendum)
[facility] Emergency Department Provider Note  ____________________________________________   I have reviewed the triage vital signs and the nursing notes. Where available I have reviewed prior notes and, if possible and indicated, outside hospital notes.    HISTORY  Chief Complaint Shortness of Breath    HPI Clinton Holmes is a 84 y.o. male   With a history of non-stented lesions from prior cath, report is capsulated below, no known history of significant CHF, EF was reassuring last time he had an echo, states that over the last month he has had some dyspnea, orthopnea, chest pain on exertion and shortness of breath on exertion.  The most recent event was this morning.  He states that he has not had any chest pain since EMS picked him up.  He states that his shortness of breath is better when he is not moving.  At this time he is pain-free.  Is a pain "all the way across my chest".    Past Medical History:  Diagnosis Date  . CAD (coronary artery disease)    L&RHC 12/23/2014 elevated LVEDP, prox LAD 45%, 60% ramus  . CKD (chronic kidney disease), stage III ([place])   . Essential hypertension   . Hypertension   . Murmur   . OA (osteoarthritis)    a. 2011 s/p bilat TKA.  . Ventricular bigeminy    a. 12/2014. Started on amio on 12/24/2014    Patient Active Problem List   Diagnosis Date Noted  . Aortic stenosis 12/23/2014  . Ventricular bigeminy 12/21/2014  . Essential hypertension   . CKD (chronic kidney disease), stage III ([place])   . Hypertension   . OA (osteoarthritis)     Past Surgical History:  Procedure Laterality Date  . CARDIAC CATHETERIZATION N/A 12/23/2014   Procedure: Right/Left Heart Cath and Coronary Angiography;  Surgeon: Belva Crome, MD;  Location: [facility];  Service: Cardiovascular;  Laterality: N/A;  . KNEE SURGERY      Prior to Admission medications   Medication Sig Start Date End Date Taking? Authorizing Provider   clopidogrel (PLAVIX) 75 MG tablet Take 1 tablet (75 mg total) by mouth daily. 11/25/17  Yes Jerline Pain, MD  diltiazem (CARDIZEM CD) 120 MG 24 hr capsule Take 1 capsule (120 mg total) by mouth daily. 12/24/14  Yes Almyra Deforest, PA  Docusate Calcium (STOOL SOFTENER PO) Take 1 tablet by mouth daily.   Yes [provider]  lisinopril (PRINIVIL,ZESTRIL) 40 MG tablet Take 1 tablet by mouth daily.   Yes [provider]  Multiple Vitamins-Minerals (MULTIVITAMIN ADULTS 50+ PO) Take 1 tablet by mouth daily.   Yes [provider]    Allergies Patient has no known allergies.  Family History  Problem Relation Age of Onset  . Heart attack Father        deceased @ 71.  . Congestive Heart Failure Sister        alive in her late 70's.  . Other Brother        alive @ 17.  Marland Kitchen CAD Mother        H/o CABG, deceased @ 84.    Social History Social History   Tobacco Use  . Smoking status: Former Research scientist (life sciences)  . Smokeless tobacco: Never Used  . Tobacco comment: Smoked for a few yrs in the service.  Quit in 1968. Never a heavy smoker.  Substance Use Topics  . Alcohol use: No    Alcohol/week: 0.0 standard drinks  .  Drug use: No    Review of Systems Constitutional: No fever/chills Eyes: No visual changes. ENT: No sore throat. No stiff neck no neck pain Cardiovascular: See HPI, has been having chest pain is not now Respiratory: Has been short of breath, feels better sitting here Gastrointestinal:   no vomiting.  No diarrhea.  No constipation. Genitourinary: Negative for dysuria. Musculoskeletal: Positive bilateral extremity edema.  Right leg is always slightly bigger than the left since he had an accident in 1970s, but it is bilaterally swollen at this time  skin: Negative for rash. Neurological: Negative for severe headaches, focal weakness or numbness.   ____________________________________________   PHYSICAL EXAM:  VITAL SIGNS: ED Triage Vitals  Enc Vitals Group      BP 07/24/18 1118 (!) 199/129     Pulse Rate 07/24/18 1118 73     Resp 07/24/18 1118 (!) 22     Temp 07/24/18 1118 (!) 97.5 F (36.4 C)     Temp src --      SpO2 07/24/18 1118 95 %     Weight 07/24/18 1119 243 lb (110.2 kg)     Height 07/24/18 1119 6' (1.829 m)     Head Circumference --      Peak Flow --      Pain Score 07/24/18 1119 0     Pain Loc --      Pain Edu? --      Excl. in [place]? --     Constitutional: Alert and oriented.  Patient does not want to sit straight up in the bed if I lying flat he states he is short of breath even though his sats do not drop. Eyes: Conjunctivae are normal Head: Atraumatic HEENT: No congestion/rhinnorhea. Mucous membranes are moist.  Oropharynx non-erythematous Neck:   Nontender with no meningismus, no masses, no stridor Cardiovascular: Normal rate, regular rhythm. Grossly normal heart sounds.  Good peripheral circulation. Respiratory: Normal respiratory effort.  No retractions.  Basilar rales appreciated Abdominal: Soft and nontender. No distention. No guarding no rebound Back:  There is no focal tenderness or step off.  there is no midline tenderness there are no lesions noted. there is no CVA tenderness Musculoskeletal: No lower extremity tenderness, no upper extremity tenderness. No joint effusions, there is bilateral pitting edema, right slightly greater than left no calf pain or swelling Neurologic:  Normal speech and language. No gross focal neurologic deficits are appreciated.  Skin:  Skin is warm, dry and intact. No rash noted. Psychiatric: Mood and affect are normal. Speech and behavior are normal.  ____________________________________________   LABS (all labs ordered are listed, but only abnormal results are displayed)  Labs Reviewed  CBC WITH DIFFERENTIAL/PLATELET - Abnormal; Notable for the following components:      Result Value   Neutro Abs 8.6 (*)    All other components within normal limits  TROPONIN I - Abnormal; Notable for  the following components:   Troponin I 0.03 (*)    All other components within normal limits  BRAIN NATRIURETIC PEPTIDE - Abnormal; Notable for the following components:   B Natriuretic Peptide 1,345.0 (*)    All other components within normal limits  PROTIME-INR  APTT    Pertinent labs  results that were available during my care of the patient were reviewed by me and considered in my medical decision making (see chart for details). ____________________________________________  EKG  I personally interpreted any EKGs ordered by me or triage Intraventricular conduction delay, no significant change from prior,  sinus rhythm ____________________________________________  RADIOLOGY  Pertinent labs & imaging results that were available during my care of the patient were reviewed by me and considered in my medical decision making (see chart for details). If possible, patient and/or family made aware of any abnormal findings.  Dg Chest 2 View  Result Date: 07/24/2018 CLINICAL DATA:  Shortness of breath, generalized chest pain EXAM: CHEST - 2 VIEW COMPARISON:  09/06/2015 FINDINGS: Cardiomegaly with vascular congestion. Interstitial prominence throughout the lungs, likely interstitial edema. Small bilateral pleural effusions. IMPRESSION: Cardiomegaly with vascular congestion and mild interstitial edema/CHF. Small bilateral effusions. Electronically Signed   By: Rolm Baptise M.D.   On: 07/24/2018 12:01   ____________________________________________    PROCEDURES  Procedure(s) performed: None  Procedures  Critical Care performed: CRITICAL CARE Performed by: Schuyler Amor   Total critical care time: 45 minutes  Critical care time was exclusive of separately billable procedures and treating other patients.  Critical care was necessary to treat or prevent imminent or life-threatening deterioration.  Critical care was time spent personally by me on the following activities: development  of treatment plan with patient and/or surrogate as well as nursing, discussions with consultants, evaluation of patient's response to treatment, examination of patient, obtaining history from patient or surrogate, ordering and performing treatments and interventions, ordering and review of laboratory studies, ordering and review of radiographic studies, pulse oximetry and re-evaluation of patient's condition.   ____________________________________________   INITIAL IMPRESSION / ASSESSMENT AND PLAN / ED COURSE  Pertinent labs & imaging results that were available during my care of the patient were reviewed by me and considered in my medical decision making (see chart for details).  Patient here with CHF symptoms, he is also been having off-and-on chest pain he is not having chest pain at this time.  His heart rate is in the low 60s when he is here.  Blood pressure is elevated but we do not know his baseline.  It does not appear that he took his blood pressure medication this morning to the extent that I can determine.  We will put nitroglycerin on his chest wall, as he is asymptomatic with this we will see if further interventions are required at age 29 I am reluctant to give him multiple medications at the same time.  In addition, we are admitting him to the hospital for what appears to be new onset heart failure.  ----------------------------------------- 1:21 PM on 07/24/2018 -----------------------------------------  Discussed with Dr. Fletcher Anon, who agrees with management including Lasix and nitroglycerin, did not request heparin, patient has no ongoing chest pain, and he is feeling no shortness of breath as long as he sits straight up in the bed.    ____________________________________________   FINAL CLINICAL IMPRESSION(S) / ED DIAGNOSES  Final diagnoses:  Dyspnea, unspecified type      This chart was dictated using voice recognition software.  Despite best efforts to proofread,   errors can occur which can change meaning.      Schuyler Amor, MD 07/24/18 1315    Schuyler Amor, MD 07/24/18 1323

## 2018-07-24 NOTE — Progress Notes (Signed)
Pt refusing bed alarm but was educated about safety. Will continue to monitor.

## 2018-07-24 NOTE — ED Triage Notes (Signed)
Pt to ER via EMS from home with c/o [facility].  Pt does not wear O2 at home, but required 4L [place] en route

## 2018-07-24 NOTE — Consult Note (Addendum)
Cardiology Consultation:   Patient ID: Clinton Holmes MRN: [id number]; DOB: September 02, 1927  Admit date: 07/24/2018 Date of Consult: 07/24/2018  Primary Care Provider: Patient, No Pcp Per Primary Cardiologist: Candee Furbish, MD  Primary Electrophysiologist:  None    Patient Profile:   Clinton Holmes is a 84 y.o. male with a hx of mild CAD, HTN, AS, ventricular bigeminy, syncope, CKDIII, OA, remote h/o acoustic neuroma gamma knife procedure at [facility] in 1990s, history of smoking (quit in 1960s) who is being seen today for the evaluation of heart failure at the request of Dr. Posey Pronto.  History of Present Illness:   Clinton Holmes is an 84 year old male with PMH as above.  PMH includes history of acoustic neuroma gamma knife procedure at [facility] in the 1990s.  Recent MRI without evidence of neuroma.  Patient reported that he used to smoke but quit around the 1960s.  12/2014: Underwent L/RHC 12/23/2014 by Dr. Daneen Schick that showed elevated LVEDP, proximal LAD 45%, 60% ramus. L/RHC copied in CV studies below. 12/24/2014: He was started on amiodarone for ventricular bigeminy.  Of note, moderate aortic stenosis was noted on her 2017 echocardiogram but no gradient on catheterization.  07/2015: Due to left eye visual defect, the amiodarone was stopped. Neuro was also consulted and started him on Plavix and atorvastatin for stroke, likely causing visual field defect.   During 2019, he had several episodes of self reported syncope.  Per Dr. Marlou Porch documentation, all of these episodes had prodrome including fatigue, lightheadedness, diaphoresis, nausea. He had a syncopal episode in the spring while shaving at the sink. He also reported an episode on Christmas eve, stating he tried to make it to the reclining chair once he felt the prodrome sx, but he reportedly failed and fell on the ground.    08/2017: TTE performed and demonstrated wall thickness increasing in a pattern of severe LVH.  EF estimated 55% to 65%.  Wall  motion was normal and no regional wall motion abnormalities.  G1DD.  Moderate aortic stenosis. Mild LAE, RAE.  11/2017: Most recently seen 11/25/2017 as an outpatient by Dr. Candee Furbish at the [facility] office.  EKG at that time showed SR, 65 bpm, nonspecific ST-T wave changes.  Prior EKGs had demonstrated frequent PVCs with noted quadrigeminy pattern. He was instructed to continue Cardizem, Lisinopril, and Plavix but stop low-dose ASA to reduce bleeding side effects.  Statin therapy was discussed but not initiated, as the family wanted to think about it.  No anginal symptoms were noted at that time.  Of note, the patient did have mildly elevated blood pressure but it was felt that his blood pressure had been reasonably under control and no changes were made to antihypertensive medications.  He mentioned the patient's aortic stenosis with recommendation to continue to monitor and no indications for surgery.   07/24/2018: Over the last 6 months to 1 year, patient has reported worsening SOB, DOE, PND.  Within the last month, patient has reported a sudden increase in DOE, PND, orthopnea, weakness, made better with rest.  Reported an increase in fatigue and that he was sleeping all of the time.  He stated that he would be unable to walk across a parking lot from his car to the grocery store. He stated this is unusual for him, as he used to be very active approximately 2 years ago and before he retired.  Over the last couple of days, he has reportedly had to sleep in his recliner as he  is unable to lay flat in bed, and despite his fatigue, was unable to sleep through the night.  He has also noted a decrease in oral intake, as he is unable to find the energy to get up and eat.  He denied feelings of early satiety, insisting that he normally eats well but did not have the energy these last few days.  However, on further questioning, it was noted that he did have nausea these last few days.  He decided to report to  [facility] ED 07/24/2018 via EMS due to continued shortness of breath, weakness, and lower extremity edema.  He stated he has never experienced anything like this before in the past.  He is not aware of recently elevated blood pressure.  He did report an increase in intake of fluid over the last few days.  He stated that he occasionally add salt to his meals and eats preprepared food but was unable to estimate how often.  He also reported occasionally going out to eat.  Has not noted an increase in weight.  Of note, patient did report that he has right ankle lower extremity edema after bumping his right ankle into a piece of wood outside.  He denies lower extremity edema other than injuring his right ankle, reporting that the only other problem with his legs is related to his arthritis.  It was documented that patient does not wear oxygen at home but required 4 L [place] in route.  In the ED, he was found to have elevated BP and sx consistent with volume overload. Vitals: BP 199/129, HR 73, RR 22, T 90 7.48F, SPO2 95% Labs: BNP 1345.0, troponin 0.03, WBC 10.2, RBC 4.84, hemoglobin 14.0, platelets 206 EKG: SR, multiform PVCs, LBBB CXR: Cardiomegaly with vascular congestion and mild interstitial edema/CHF.  Small bilateral effusions Meds: Started on Lasix IV 60mg , nitroglycerin.  No indication for heparin as no ongoing chest pain.  No reported chest pain at the time of evaluation; however, he continues to be short of breath and struggles with deep inspiration and expiration during his lung exam. Currently on O2 but does not use at home. Ambulates at home with a walker  Review of telemetry shows NSVT, PVCs, bigeminy, possible Afib/AT. Patient denies CP, palpitations, racing heart rate. He reported CP in the ED, but on exam, he now denies chest pain.  Past Medical History:  Diagnosis Date  . CAD (coronary artery disease)    L&RHC 12/23/2014 elevated LVEDP, prox LAD 45%, 60% ramus  . CKD (chronic kidney disease),  stage III ([place])   . Essential hypertension   . Hypertension   . Murmur   . OA (osteoarthritis)    a. 2011 s/p bilat TKA.  . Ventricular bigeminy    a. 12/2014. Started on amio on 12/24/2014    Past Surgical History:  Procedure Laterality Date  . CARDIAC CATHETERIZATION N/A 12/23/2014   Procedure: Right/Left Heart Cath and Coronary Angiography;  Surgeon: Belva Crome, MD;  Location: [place] [facility];  Service: Cardiovascular;  Laterality: N/A;  . KNEE SURGERY       Home Medications:  Prior to Admission medications   Medication Sig Start Date End Date Taking? Authorizing Provider  clopidogrel (PLAVIX) 75 MG tablet Take 1 tablet (75 mg total) by mouth daily. 11/25/17  Yes Jerline Pain, MD  diltiazem (CARDIZEM CD) 120 MG 24 hr capsule Take 1 capsule (120 mg total) by mouth daily. 12/24/14  Yes Almyra Deforest, PA  Docusate Calcium (STOOL  SOFTENER PO) Take 1 tablet by mouth daily.   Yes [provider]  lisinopril (PRINIVIL,ZESTRIL) 40 MG tablet Take 1 tablet by mouth daily.   Yes [provider]  Multiple Vitamins-Minerals (MULTIVITAMIN ADULTS 50+ PO) Take 1 tablet by mouth daily.   Yes [provider]    Inpatient Medications: Scheduled Meds: . nitroGLYCERIN  1 inch Topical Once   Continuous Infusions:  PRN Meds:   Allergies:   No Known Allergies  Social History:   Social History   Socioeconomic History  . Marital status: Single    Spouse name: Not on file  . Number of children: Not on file  . Years of education: Not on file  . Highest education level: Not on file  Occupational History  . Not on file  Social Needs  . Financial resource strain: Not on file  . Food insecurity:    Worry: Not on file    Inability: Not on file  . Transportation needs:    Medical: Not on file    Non-medical: Not on file  Tobacco Use  . Smoking status: Former Research scientist (life sciences)  . Smokeless tobacco: Never Used  . Tobacco comment: Smoked for a few yrs in the service.  Quit in  1968. Never a heavy smoker.  Substance and Sexual Activity  . Alcohol use: No    Alcohol/week: 0.0 standard drinks  . Drug use: No  . Sexual activity: Not on file  Lifestyle  . Physical activity:    Days per week: Not on file    Minutes per session: Not on file  . Stress: Not on file  Relationships  . Social connections:    Talks on phone: Not on file    Gets together: Not on file    Attends religious service: Not on file    Active member of club or organization: Not on file    Attends meetings of clubs or organizations: Not on file    Relationship status: Not on file  . Intimate partner violence:    Fear of current or ex partner: Not on file    Emotionally abused: Not on file    Physically abused: Not on file    Forced sexual activity: Not on file  Other Topics Concern  . Not on file  Social History Narrative   Lives between [place] and [place] by himself.  He does not routinely exercise.    Family History:    Family History  Problem Relation Age of Onset  . Heart attack Father        deceased @ 31.  . Congestive Heart Failure Sister        alive in her late 69's.  . Other Brother        alive @ 27.  Marland Kitchen CAD Mother        H/o CABG, deceased @ 78.     ROS:  Please see the history of present illness.  Review of Systems  Constitutional: Positive for malaise/fatigue. Negative for diaphoresis.  HENT: Positive for hearing loss.        Chronic, L side worse than R On [place]/O2  Eyes: Positive for redness.       Noted on exam, suspected as related to lack of sleep  Respiratory: Positive for shortness of breath. Negative for hemoptysis and sputum production.   Cardiovascular: Positive for orthopnea, leg swelling and PND. Negative for chest pain and palpitations.       Intermittent but not current CP  L ankle swelling attributed to running into some wood outside  Gastrointestinal: Positive for nausea. Negative for vomiting.       Nausea currently resolved    Musculoskeletal: Positive for joint pain.       Chronic joint pain, arthritis  Psychiatric/Behavioral: Negative for substance abuse.       Decreased sleep  All other systems reviewed and are negative.   All other ROS reviewed and negative.     Physical Exam/Data:   Vitals:   07/24/18 1118 07/24/18 1119 07/24/18 1130  BP: (!) 199/129  (!) 174/111  Pulse: 73  (!) 57  Resp: (!) 22  16  Temp: (!) 97.5 F (36.4 C)    SpO2: 95%  94%  Weight:  110.2 kg   Height:  6' (1.829 m)    No intake or output data in the 24 hours ending 07/24/18 1328 Filed Weights   07/24/18 1119  Weight: 110.2 kg   Body mass index is 32.96 kg/m.  General:  Well nourished, well developed, in no acute distress. Lying in bed with head elevated HEENT: normal. Hard of hearing in L ear Neck: JVD not assessed as patient almost 90 degrees/ due to angle of patient Vascular: No carotid bruits but likely L sided radiation from murmur /  appreciated systolic murmur in L carotid artery; FA pulses 2+ bilaterally without bruits  Cardiac:  normal S1, S2; RRR; 3/6 systolic murmur Lungs:  Coarse breath sounds. no wheezing, rhonchi or rales  Abd: distended, nontender, no hepatomegaly   Ext: trivial LEE on L side. Mild LEE / 1+ LEE on R side  Musculoskeletal:  No deformities, Reduced strength on R side at ankle Skin: warm and dry  Neuro:  no focal abnormalities noted Psych:  Normal affect   EKG: Refer to HPI SR, multiform PVCs, LBBB Telemetry:  Telemetry was personally reviewed and demonstrates:    CV Studies:   Relevant CV Studies:  Echocardiogram 08/19/17: Normal EF, LVH present Moderate aortic stenosis as was previously seen on prior echocardiogram  Overall no change.  Study Conclusions - Left ventricle: The cavity size was normal. Wall thickness was   increased in a pattern of severe LVH. Systolic function was   normal. The estimated ejection fraction was in the range of 55%   to 65%. Wall motion was normal;  there were no regional wall   motion abnormalities. Doppler parameters are consistent with   abnormal left ventricular relaxation (grade 1 diastolic   dysfunction). - Aortic valve: Mildly calcified annulus. Mildly thickened,   moderately calcified leaflets. There was moderate stenosis. Valve   area (VTI): 0.73 cm^2. Valve area (Vmax): 0.69 cm^2. Valve area   (Vmean): 0.65 cm^2. - Left atrium: The atrium was mildly dilated. - Right atrium: The atrium was mildly dilated.   ECHO: 12/22/14  - Left ventricle: The cavity size was normal. There was moderate concentric hypertrophy. Systolic function was normal. The estimated ejection fraction was in the range of 55% to 60%. Wall motion was normal; there were no regional wall motion abnormalities. ncessant bigeminy limits evaluation of LV diastolic function. Doppler parameters are consistent with elevated mean left atrial filling pressure. - Aortic valve: There was moderate stenosis. There was trivial regurgitation. Valve area (VTI): 1.26 cm^2. Valve area (Vmax): 1.28 cm^2. Valve area (Vmean): 1.15 cm^2. - Mitral valve: There was mild to moderate regurgitation directed eccentrically and posteriorly. - Left atrium: The atrium was severely dilated. - Right ventricle: The cavity size was mildly dilated. Wall  thickness was normal. - Pulmonary arteries: Systolic pressure was mildly increased. PA peak pressure: 39 mm Hg (S).   Cardiac catheterization 12/23/14  Widely patent coronary arteries with 45% proximal LAD narrowing. The ramus intermedius branches and the more medial/anterior branch contains a 60% stenosis. No hemodynamically significant coronary lesions were identified.  No evidence of aortic stenosis  Mild pulmonary hypertension  Apparent mild global left ventricular hypokinesis although left ventriculography was of poor quality due to faint opacification.  Laboratory Data:  ChemistryNo results for input(s): NA, K, CL, CO2, GLUCOSE,  BUN, CREATININE, CALCIUM, GFRNONAA, GFRAA, ANIONGAP in the last 168 hours.  No results for input(s): PROT, ALBUMIN, AST, ALT, ALKPHOS, BILITOT in the last 168 hours. Hematology Recent Labs  Lab 07/24/18 1158  WBC 10.2  RBC 4.84  HGB 14.0  HCT 43.7  MCV 90.3  MCH 28.9  MCHC 32.0  RDW 14.4  PLT 206   Cardiac Enzymes Recent Labs  Lab 07/24/18 1158  TROPONINI 0.03*   No results for input(s): TROPIPOC in the last 168 hours.  BNP Recent Labs  Lab 07/24/18 1159  BNP 1,345.0*    DDimer No results for input(s): DDIMER in the last 168 hours.  Radiology/Studies:  Dg Chest 2 View  Result Date: 07/24/2018 CLINICAL DATA:  Shortness of breath, generalized chest pain EXAM: CHEST - 2 VIEW COMPARISON:  09/06/2015 FINDINGS: Cardiomegaly with vascular congestion. Interstitial prominence throughout the lungs, likely interstitial edema. Small bilateral pleural effusions. IMPRESSION: Cardiomegaly with vascular congestion and mild interstitial edema/CHF. Small bilateral effusions. Electronically Signed   By: Rolm Baptise M.D.   On: 07/24/2018 12:01    Assessment and Plan:   Suspected HFpEF/HFrEF d/t volume overload - SOB on exam. DOE/PND and symptoms consistent with HF. On O2. Does not use at home - Previous echo as above with EF normal and pending updated echo - BNP 1345.0 / elevated as above, consistent with volume overload - Known h/o AS with loud murmur on exam. Consider as etiology. Also consider ICM d/t uncontrolled HTN as below and in setting of worsening volume overload. - Received ED diuresis and started on IV lasix 40mg  qd. Continue. Monitor I&O's / urinary output / weights. Daily BMET to monitor renal function and electrolytes. Replace electrolytes as indicated with potassium goal 4.0 and Mg 2.0 - If continued SOB, consider ddimer of chest CT to r/o PE as patient reported inactivity and very sudden worsening of SOB - Further recommendations pending echocardiogram, ordered  today  H/o ventricular bigeminy, PVCs, NSVT, junctional rhythm, h/o syncope - Telemetry shows PVCs and NSVT versus junctional rhythm with h/o ventricular bigeminy  - Will check electrolytes, TSH. Order repeat EKG.  - Will start on heparin given elevated troponin. Continue to cycle troponin. Likely elevated in the setting of demand ischemia with elevated BP, ectopy, volume overload - Continue to monitor. Currently hemodynamically stable. No syncopal episodes. - Recommend bed restrictions. Pt normally ambulates with walker. - Previously on amiodarone, stopped d/t left eye and stroke sx - PTA Cardizem. On plavix. Recommend BB instead of CCB if reduced EF as above. - No recent syncopal episodes. If syncopal episode or patient hemodynamically unstable, will need EP evaluation - Continue to monitor on telemetry  H/o nonobstructive CAD - No chest pain reported by patient but rather chest tightness d/t SOB - Troponin 0.03  0.04. Minimal elevation in the setting of volume overload, elevated BP, AS. - Continue to cycle as cannot r/o cardiac etiology at this time given risk factors -  Minimal CAD on last cath in 2016 as above. Updating lipid panel - Ordered updated LDL as last LDL 107 and not at goal in 2016 - Further recommendations pending lipid panel. Continue medical management with with SL nitro as needed if CP. Started heparin drip given elevated troponin and intermittent CP. Remainder of medical management as above. Pending MD to see patient. Cannot r/o cardiac ischemia at this time with elevated troponin, elevated LDL, and patient risk factors. If plan for invasive procedure including cath, would discontinue plavix. Further ischemic evaluation recommended if chest pain returns or if notable bump in troponin with LDL elevated. Continue Plavix for now.  Anemia - Hgb 13.6 - Daily CBC - Continue to monitor Hgb as on plavix and heparin - Transfuse below 8 - Per IM   HTN, uncontrolled  - BP  183/96, HR 64 - Uncontrolled HTN with antihypertensives limited by current HR and h/o syncopal episodes. - On PTA lisinopril. Continue - renal function at baseline. PTA diltiazem. Recommend transition to BB as below d/t LEE in the future and if reduced EF on return of echo, as well as for additional BP support - Continue to monitor vitals - Per IM  CKDIII - PTA lisinopril. Continue - Cr stable, 1.37, baseline ~ 1.2-1.3; K 4.1 - Daily BMET  - Per IM  For questions or updates, please contact [place] Please consult [email or web address] for contact info under     Signed, Arvil Chaco, PA-C  07/24/2018 1:28 PM

## 2018-07-24 NOTE — Progress Notes (Signed)
Family Meeting Note  Advance Directive:yes   Today a meeting took place with the pt and dter patient is being admitted with acute onset congestive heart failure diastolic. Presented with shortness of breath and leg edema for several days. Code status discussed with patient and daughter in the room. Patient tells me he does not want mechanical ventilator even if it is temporary. He is a partial code.  Time spent during discussion: 17 minutes Fritzi Mandes, MD

## 2018-07-24 NOTE — Progress Notes (Signed)
ANTICOAGULATION CONSULT NOTE - Initial Consult  Pharmacy Consult for Heparin Drip Indication: chest pain/ACS  No Known Allergies  Patient Measurements: Height: 6' (182.9 cm) Weight: 243 lb (110.2 kg) IBW/kg (Calculated) : 77.6 Heparin Dosing Weight: 101 kg  Vital Signs: Temp: 97.8 F (36.6 C) (12/12 1501) Temp Source: Oral (12/12 1501) BP: 183/96 (12/12 1501) Pulse Rate: 64 (12/12 1501)  Labs: Recent Labs    07/24/18 1158 07/24/18 1531  HGB 14.0 13.6  HCT 43.7 42.4  PLT 206 200  APTT 33  --   LABPROT 15.2  --   INR 1.21  --   CREATININE  --  1.37*  TROPONINI 0.03* 0.04*    Estimated Creatinine Clearance: 47.8 mL/min (A) (by C-G formula based on SCr of 1.37 mg/dL (H)).   Medical History: Past Medical History:  Diagnosis Date  . CAD (coronary artery disease)    L&RHC 12/23/2014 elevated LVEDP, prox LAD 45%, 60% ramus  . CKD (chronic kidney disease), stage III ([place])   . Essential hypertension   . Hypertension   . Murmur   . OA (osteoarthritis)    a. 2011 s/p bilat TKA.  . Ventricular bigeminy    a. 12/2014. Started on amio on 12/24/2014    Medications:  Scheduled:  . clopidogrel  75 mg Oral Daily  . diltiazem  120 mg Oral Daily  . furosemide  40 mg Intravenous Q12H  . heparin  4,000 Units Intravenous Once  . lisinopril  40 mg Oral Daily  . multivitamin with minerals   Oral Daily  . sodium chloride flush  3 mL Intravenous Q12H   Infusions:  . heparin      Assessment: 84 yo M to start Heparin drip for elevated troponin, ACS/STEMI.  Plavix PTA. Hgb 14  Plt 206  APTT 33  INR 1.21  Goal of Therapy:  Heparin level 0.3-0.7 units/ml Monitor platelets by anticoagulation protocol: Yes   Plan:  Give 4000 units bolus x 1 Start heparin infusion at 1300 units/hr Check anti-Xa level in 8 hours and daily while on heparin Continue to monitor H&H and platelets  Trea Latner A 07/24/2018,5:42 PM

## 2018-07-24 NOTE — H&P (Signed)
[facility] [place] NAME: Tadd Holtmeyer    MR#:  [id number]  DATE OF BIRTH:  1928-04-12  DATE OF ADMISSION:  07/24/2018  PRIMARY CARE PHYSICIAN: Patient, No Pcp Per   REQUESTING/REFERRING PHYSICIAN: Dr Burlene Arnt  CHIEF COMPLAINT:   Increasing shortness of breath on and off for several days. HISTORY OF PRESENT ILLNESS:  [place]lu[place]wa Kinyon  is a 84 y.o. male with a known history of CAD, chronic kidney disease stage III, hypertension, history of ventricular Bigemini comes to the emergency room accompanied by daughter with increasing shortness of breath leg swelling and weakness. he has been having orthopnea and paroxysmal nocturnal dyspnea. He was found to have congestive heart failure. Echo in the past shows EF of 55 to 60%. Patient is being admitted for acute congestive heart failure diastolic. Patient received Lasix 60 mg IV once in the ER. He does not wear oxygen at home.  PAST MEDICAL HISTORY:   Past Medical History:  Diagnosis Date  . CAD (coronary artery disease)    L&RHC 12/23/2014 elevated LVEDP, prox LAD 45%, 60% ramus  . CKD (chronic kidney disease), stage III ([place])   . Essential hypertension   . Hypertension   . Murmur   . OA (osteoarthritis)    a. 2011 s/p bilat TKA.  . Ventricular bigeminy    a. 12/2014. Started on amio on 12/24/2014    PAST SURGICAL HISTOIRY:   Past Surgical History:  Procedure Laterality Date  . CARDIAC CATHETERIZATION N/A 12/23/2014   Procedure: Right/Left Heart Cath and Coronary Angiography;  Surgeon: Belva Crome, MD;  Location: [facility];  Service: Cardiovascular;  Laterality: N/A;  . KNEE SURGERY      SOCIAL HISTORY:   Social History   Tobacco Use  . Smoking status: Former Research scientist (life sciences)  . Smokeless tobacco: Never Used  . Tobacco comment: Smoked for a few yrs in the service.  Quit in 1968. Never a heavy smoker.  Substance Use Topics  . Alcohol use: No    Alcohol/week: 0.0 standard  drinks    FAMILY HISTORY:   Family History  Problem Relation Age of Onset  . Heart attack Father        deceased @ 67.  . Congestive Heart Failure Sister        alive in her late 63's.  . Other Brother        alive @ 12.  Marland Kitchen CAD Mother        H/o CABG, deceased @ 37.    DRUG ALLERGIES:  No Known Allergies  REVIEW OF SYSTEMS:  ROS   MEDICATIONS AT HOME:   Prior to Admission medications   Medication Sig Start Date End Date Taking? Authorizing Provider  clopidogrel (PLAVIX) 75 MG tablet Take 1 tablet (75 mg total) by mouth daily. 11/25/17  Yes Jerline Pain, MD  diltiazem (CARDIZEM CD) 120 MG 24 hr capsule Take 1 capsule (120 mg total) by mouth daily. 12/24/14  Yes Almyra Deforest, PA  Docusate Calcium (STOOL SOFTENER PO) Take 1 tablet by mouth daily.   Yes [provider]  lisinopril (PRINIVIL,ZESTRIL) 40 MG tablet Take 1 tablet by mouth daily.   Yes [provider]  Multiple Vitamins-Minerals (MULTIVITAMIN ADULTS 50+ PO) Take 1 tablet by mouth daily.   Yes [provider]      VITAL SIGNS:  Blood pressure (!) 187/116, pulse 73, temperature (!) 97.5 F (36.4 C), resp. rate (!) 22, height 6' (1.829  m), weight 110.2 kg, SpO2 99 %.  PHYSICAL EXAMINATION:  GENERAL:  84 y.o.-year-old patient lying in the bed with no acute distress.  EYES: Pupils equal, round, reactive to light and accommodation. No scleral icterus. Extraocular muscles intact.  HEENT: Head atraumatic, normocephalic. Oropharynx and nasopharynx clear.  NECK:  Supple, no jugular venous distention. No thyroid enlargement, no tenderness.  LUNGS: Normal breath sounds bilaterally, no wheezing, rales,rhonchi or crepitation. No use of accessory muscles of respiration.  CARDIOVASCULAR: S1, S2 normal. No murmurs, rubs, or gallops.  ABDOMEN: Soft, nontender, nondistended. Bowel sounds present. No organomegaly or mass.  EXTREMITIES: No pedal edema, cyanosis, or clubbing.  NEUROLOGIC: Cranial nerves II  through XII are intact. Muscle strength 5/5 in all extremities. Sensation intact. Gait not checked.  PSYCHIATRIC: The patient is alert and oriented x 3.  SKIN: No obvious rash, lesion, or ulcer.   LABORATORY PANEL:   CBC Recent Labs  Lab 07/24/18 1158  WBC 10.2  HGB 14.0  HCT 43.7  PLT 206   ------------------------------------------------------------------------------------------------------------------  Chemistries  No results for input(s): NA, K, CL, CO2, GLUCOSE, BUN, CREATININE, CALCIUM, MG, AST, ALT, ALKPHOS, BILITOT in the last 168 hours.  Invalid input(s): GFRCGP ------------------------------------------------------------------------------------------------------------------  Cardiac Enzymes Recent Labs  Lab 07/24/18 1158  TROPONINI 0.03*   ------------------------------------------------------------------------------------------------------------------  RADIOLOGY:  Dg Chest 2 View  Result Date: 07/24/2018 CLINICAL DATA:  Shortness of breath, generalized chest pain EXAM: CHEST - 2 VIEW COMPARISON:  09/06/2015 FINDINGS: Cardiomegaly with vascular congestion. Interstitial prominence throughout the lungs, likely interstitial edema. Small bilateral pleural effusions. IMPRESSION: Cardiomegaly with vascular congestion and mild interstitial edema/CHF. Small bilateral effusions. Electronically Signed   By: Rolm Baptise M.D.   On: 07/24/2018 12:01    EKG:  sinus rhythm with bigeminy/PVC  IMPRESSION AND PLAN:   Dayvon Dax  is a 84 y.o. male with a known history of CAD, chronic kidney disease stage III, hypertension, history of ventricular Bigemini comes to the emergency room accompanied by daughter with increasing shortness of breath leg swelling and weakness.  1. Acute congestive heart failure diastolic new onset -patient came in with increasing shortness of breath, leg swelling, elevated BNP and chest x-ray consistent with pulmonary vascular congestion -Lasix 40  BID -continue cardiac meds -cardiology consultation with Dr. Fletcher Anon message sent via secure chat -monitor input and output  2. Hypertension continue lisinopril  3. History of ventricular bigemini-- on diltiazem  4. Chronic kidney disease stage III -metabolic panel results not available yet -avoid nephrotoxins  5. DVT prophylaxis Lovenox  discussed with patient and daughter   All the records are reviewed and case discussed with ED provider. Management plans discussed with the patient, family and they are in agreement.  CODE STATUS: limited code  TOTAL TIME TAKING CARE OF THIS PATIENT: *50* minutes.    Fritzi Mandes M.D on 07/24/2018 at 2:03 PM  Between 7am to 6pm - Pager - [phone]  After 6pm go to [email or web address] - password [email or web address] [facility]  Office  [phone]  CC: Primary care physician; Patient, No Pcp Per

## 2018-07-25 ENCOUNTER — Inpatient Hospital Stay (HOSPITAL_COMMUNITY)
Admit: 2018-07-25 | Discharge: 2018-07-25 | Disposition: A | Discharge status: Home / Self Care | Payer: Medicare Other | Attending: Physician Assistant | Admitting: Physician Assistant

## 2018-07-25 DIAGNOSIS — I351 Nonrheumatic aortic (valve) insufficiency: Secondary | ICD-10-CM

## 2018-07-25 DIAGNOSIS — I5023 Acute on chronic systolic (congestive) heart failure: Secondary | ICD-10-CM

## 2018-07-25 LAB — CBC
HCT: 39.7 % (ref 39.0–52.0)
Hemoglobin: 12.7 g/dL — ABNORMAL LOW (ref 13.0–17.0)
MCH: 28.5 pg (ref 26.0–34.0)
MCHC: 32 g/dL (ref 30.0–36.0)
MCV: 89.2 fL (ref 80.0–100.0)
NRBC: 0 % (ref 0.0–0.2)
Platelets: 168 10*3/uL (ref 150–400)
RBC: 4.45 MIL/uL (ref 4.22–5.81)
RDW: 14.3 % (ref 11.5–15.5)
WBC: 6.8 10*3/uL (ref 4.0–10.5)

## 2018-07-25 LAB — BASIC METABOLIC PANEL
Anion gap: 6 (ref 5–15)
BUN: 30 mg/dL — ABNORMAL HIGH (ref 8–23)
CO2: 30 mmol/L (ref 22–32)
Calcium: 9.2 mg/dL (ref 8.9–10.3)
Chloride: 104 mmol/L (ref 98–111)
Creatinine, Ser: 1.77 mg/dL — ABNORMAL HIGH (ref 0.61–1.24)
GFR calc Af Amer: 39 mL/min — ABNORMAL LOW (ref >60)
GFR calc non Af Amer: 34 mL/min — ABNORMAL LOW (ref >60)
Glucose, Bld: 102 mg/dL — ABNORMAL HIGH (ref 70–99)
Potassium: 3.8 mmol/L (ref 3.5–5.1)
Sodium: 140 mmol/L (ref 135–145)

## 2018-07-25 LAB — ECHOCARDIOGRAM COMPLETE
Height: 72 in
Weight: 3888 oz

## 2018-07-25 LAB — TROPONIN I: Troponin I: 0.04 ng/mL (ref <0.03)

## 2018-07-25 LAB — HEPARIN LEVEL (UNFRACTIONATED): Heparin Unfractionated: <0.1 IU/mL — ABNORMAL LOW (ref 0.30–0.70)

## 2018-07-25 MED ORDER — FUROSEMIDE 40 MG PO TABS
40.0000 mg | ORAL_TABLET | Freq: Every day | ORAL | Status: DC
  Administered 2018-07-26: 40 mg via ORAL
  Filled 2018-07-25: qty 1

## 2018-07-25 MED ORDER — METOPROLOL SUCCINATE ER 50 MG PO TB24
50.0000 mg | ORAL_TABLET | Freq: Every day | ORAL | Status: DC
  Administered 2018-07-26: 50 mg via ORAL
  Filled 2018-07-25: qty 1

## 2018-07-25 NOTE — Progress Notes (Signed)
[facility] [place] NAME: Clinton Holmes    MR#:  [id number]  DATE OF BIRTH:  [birth year]  SUBJECTIVE:  CHIEF COMPLAINT: Patient is feeling much better denies any shortness of breath.  REVIEW OF SYSTEMS:  CONSTITUTIONAL: No fever, fatigue or weakness.  EYES: No blurred or double vision.  EARS, NOSE, AND THROAT: No tinnitus or ear pain.  RESPIRATORY: No cough, shortness of breath, wheezing or hemoptysis.  CARDIOVASCULAR: No chest pain, orthopnea, edema.  GASTROINTESTINAL: No nausea, vomiting, diarrhea or abdominal pain.  GENITOURINARY: No dysuria, hematuria.  ENDOCRINE: No polyuria, nocturia,  HEMATOLOGY: No anemia, easy bruising or bleeding SKIN: No rash or lesion. MUSCULOSKELETAL: No joint pain or arthritis.   NEUROLOGIC: No tingling, numbness, weakness.  PSYCHIATRY: No anxiety or depression.   DRUG ALLERGIES:  No Known Allergies  VITALS:  Blood pressure 124/79, pulse 73, temperature 97.6 F (36.4 C), temperature source Oral, resp. rate 19, height 6' (1.829 m), weight 110.2 kg, SpO2 93 %.  PHYSICAL EXAMINATION:  GENERAL:  84 y.o.-year-old patient lying in the bed with no acute distress.  EYES: Pupils equal, round, reactive to light and accommodation. No scleral icterus. Extraocular muscles intact.  HEENT: Head atraumatic, normocephalic. Oropharynx and nasopharynx clear.  NECK:  Supple, no jugular venous distention. No thyroid enlargement, no tenderness.  LUNGS: Normal breath sounds bilaterally, no wheezing, rales,rhonchi or crepitation. No use of accessory muscles of respiration.  CARDIOVASCULAR: S1, S2 normal. No murmurs, rubs, or gallops.  ABDOMEN: Soft, nontender, nondistended. Bowel sounds present. No organomegaly or mass.  EXTREMITIES: No pedal edema, cyanosis, or clubbing.  NEUROLOGIC: Awake, alert and oriented x3 sensation intact. Gait not checked.  PSYCHIATRIC: The patient is alert and oriented x 3.  SKIN: No obvious  rash, lesion, or ulcer.    LABORATORY PANEL:   CBC Recent Labs  Lab 07/25/18 0233  WBC 6.8  HGB 12.7*  HCT 39.7  PLT 168   ------------------------------------------------------------------------------------------------------------------  Chemistries  Recent Labs  Lab 07/24/18 1531 07/25/18 0233  NA  --  140  K  --  3.8  CL  --  104  CO2  --  30  GLUCOSE  --  102*  BUN  --  30*  CREATININE 1.37* 1.77*  CALCIUM  --  9.2  MG 2.0  --    ------------------------------------------------------------------------------------------------------------------  Cardiac Enzymes Recent Labs  Lab 07/25/18 0233  TROPONINI 0.04*   ------------------------------------------------------------------------------------------------------------------  RADIOLOGY:  Dg Chest 2 View  Result Date: 07/24/2018 CLINICAL DATA:  Shortness of breath, generalized chest pain EXAM: CHEST - 2 VIEW COMPARISON:  09/06/2015 FINDINGS: Cardiomegaly with vascular congestion. Interstitial prominence throughout the lungs, likely interstitial edema. Small bilateral pleural effusions. IMPRESSION: Cardiomegaly with vascular congestion and mild interstitial edema/CHF. Small bilateral effusions. Electronically Signed   By: Rolm Baptise M.D.   On: 07/24/2018 12:01    EKG:   Orders placed or performed during the hospital encounter of 07/24/18  . ED EKG  . ED EKG  . EKG 12-Lead  . EKG 12-Lead    ASSESSMENT AND PLAN:   Clinton Holmes  is a 84 y.o. male with a known history of CAD, chronic kidney disease stage III, hypertension, history of ventricular Bigemini comes to the emergency room accompanied by daughter with increasing shortness of breath leg swelling and weakness.  1. Acute congestive heart failure diastolic new onset -patient came in with increasing shortness of breath, leg swelling, elevated BNP and chest x-ray consistent with pulmonary vascular congestion -  Lasix 40 BID discontinued in view of  worsening renal insufficiency Will start Lasix 40 mg once daily from tomorrow by mouth if renal function is stable -continue cardiac meds, but cardiology recommended to change diltiazem to Toprol 50 mg once daily and for additional blood pressure control amlodipine can be added if needed -cardiology consultation with Dr. Fletcher Anon message sent via secure chat -monitor input and output  2. Hypertension continue lisinopril  3. History of ventricular bigemini-- on diltiazem, changing to Toprol 50 mg once daily  4.   Acute kidney injury on chronic kidney disease stage III Renal function is worse discontinued IV Lasix Check a.m. labs including BMP and changed to p.o. Lasix if renal function is steady/stable -avoid nephrotoxins  5. DVT prophylaxis Lovenox  discussed with patient      All the records are reviewed and case discussed with Care Management/Social Workerr. Management plans discussed with the patient, family and they are in agreement.  CODE STATUS:   TOTAL TIME TAKING CARE OF THIS PATIENT: 35  minutes.   POSSIBLE D/C IN am  DAYS, DEPENDING ON CLINICAL CONDITION.  Note: This dictation was prepared with Dragon dictation along with smaller phrase technology. Any transcriptional errors that result from this process are unintentional.   Nicholes Mango M.D on 07/25/2018 at 5:31 PM  Between 7am to 6pm - Pager - [phone] After 6pm go to [email or web address] - password [email or web address] [facility]  Office  [phone]  CC: Primary care physician; Patient, No Pcp Per

## 2018-07-25 NOTE — Progress Notes (Signed)
Pt refused bed alarm but was educated about safety.. Will continue to monitor. 

## 2018-07-25 NOTE — Progress Notes (Signed)
ANTICOAGULATION CONSULT NOTE - Initial Consult  Pharmacy Consult for Heparin Drip Indication: chest pain/ACS  No Known Allergies  Patient Measurements: Height: 6' (182.9 cm) Weight: 243 lb (110.2 kg) IBW/kg (Calculated) : 77.6 Heparin Dosing Weight: 101 kg  Vital Signs: Temp: 97.6 F (36.4 C) (12/12 2022) Temp Source: Oral (12/12 2022) BP: 135/91 (12/12 2022) Pulse Rate: 69 (12/12 2022)  Labs: Recent Labs    07/24/18 1158 07/24/18 1531 07/24/18 2126 07/25/18 0233  HGB 14.0 13.6  --  12.7*  HCT 43.7 42.4  --  39.7  PLT 206 200  --  168  APTT 33  --   --   --   LABPROT 15.2  --   --   --   INR 1.21  --   --   --   HEPARINUNFRC  --   --   --  <0.10*  CREATININE  --  1.37*  --  1.77*  TROPONINI 0.03* 0.04* 0.05* 0.04*    Estimated Creatinine Clearance: 37 mL/min (A) (by C-G formula based on SCr of 1.77 mg/dL (H)).   Medical History: Past Medical History:  Diagnosis Date  . CAD (coronary artery disease)    L&RHC 12/23/2014 elevated LVEDP, prox LAD 45%, 60% ramus  . CKD (chronic kidney disease), stage III ([place])   . Essential hypertension   . Hypertension   . Murmur   . OA (osteoarthritis)    a. 2011 s/p bilat TKA.  . Ventricular bigeminy    a. 12/2014. Started on amio on 12/24/2014    Medications:  Scheduled:  . clopidogrel  75 mg Oral Daily  . diltiazem  120 mg Oral Daily  . doxazosin  2 mg Oral Q12H  . furosemide  40 mg Intravenous Q12H  . lisinopril  40 mg Oral Daily  . multivitamin with minerals   Oral Daily  . sodium chloride flush  3 mL Intravenous Q12H   Infusions:    Assessment: 84 yo M to start Heparin drip for elevated troponin, ACS/STEMI.  Plavix PTA. Hgb 14  Plt 206  APTT 33  INR 1.21  Goal of Therapy:  Heparin level 0.3-0.7 units/ml Monitor platelets by anticoagulation protocol: Yes   Plan:  Give 4000 units bolus x 1 Start heparin infusion at 1300 units/hr Check anti-Xa level in 8 hours and daily while on heparin Continue to  monitor H&H and platelets   12/13 0230 heparin level <0.1. Drip was dc by cardiology.  Kimarie Coor S 07/25/2018,5:47 AM

## 2018-07-25 NOTE — Plan of Care (Signed)
  Problem: Health Behavior/Discharge Planning: Goal: Ability to manage health-related needs will improve Outcome: Progressing   Problem: Activity: Goal: Risk for activity intolerance will decrease Outcome: Progressing   Problem: Activity: Goal: Capacity to carry out activities will improve Outcome: Progressing

## 2018-07-25 NOTE — Evaluation (Signed)
Physical Therapy Evaluation Patient Details Name: Clinton Holmes MRN: [id number] DOB: 1927-08-21 Today's Date: 07/25/2018   History of Present Illness  84 y.o. male with a known history of CAD, chronic kidney disease stage III, hypertension, history of ventricular Bigemini comes to the emergency room accompanied by daughter with increasing shortness of breath leg swelling and weakness.  Clinical Impression  Pt did well with PT and showed ability to safely manage in-home distances w/o assist.  He did relatively well with heavy reliance on the walker but endorses considerable unsteadiness w/o it (2/2 chronic inner ear/vestibular issues).  Overall he did well and showed good confidence with limited distance, he was on room air t/o ambulation and sats remained stable in the mid/low 90s.  Pt unsure if he wants HHPT, but it would be beneficial for him regarding balance, strength and ambulation.    Follow Up Recommendations Home health PT    Equipment Recommendations  None recommended by PT    Recommendations for Other Services       Precautions / Restrictions Restrictions Weight Bearing Restrictions: No      Mobility  Bed Mobility Overal bed mobility: Independent                Transfers Overall transfer level: Independent Equipment used: Rolling walker (2 wheeled)             General transfer comment: Pt able to rise and maintain balance without assist   Ambulation/Gait Ambulation/Gait assistance: Supervision Gait Distance (Feet): 125 Feet Assistive device: Rolling walker (2 wheeled)       General Gait Details: Pt was able to ambulate with walker w/o LOBs or overt safety issues.  He did rely on walker but otherwise appeared relatively confident and showed good effort.  Pt on room air t/o the effort and sats remained in the mid/low 90s t/o the effort  Stairs            Wheelchair Mobility    Modified Rankin (Stroke Patients Only)       Balance Overall  balance assessment: Modified Independent(reliant on UEs/RW in standing)                                           Pertinent Vitals/Pain      Home Living Family/patient expects to be discharged to:: Private residence Living Arrangements: Alone     Home Access: Stairs to enter Entrance Stairs-Rails: (has lightening rod to hold on steps) Entrance Stairs-Number of Steps: 3   Home Equipment: Walker - 2 wheels      Prior Function Level of Independence: Independent               Hand Dominance        Extremity/Trunk Assessment   Upper Extremity Assessment Upper Extremity Assessment: Generalized weakness;Overall [facility] for tasks assessed    Lower Extremity Assessment Lower Extremity Assessment: Overall WFL for tasks assessed       Communication   Communication: No difficulties  Cognition Arousal/Alertness: Awake/alert Behavior During Therapy: WFL for tasks assessed/performed Overall Cognitive Status: Within Functional Limits for tasks assessed                                 General Comments: Pt not overly eager to do much with PT siting poor balance 2/2 inner ear issues  General Comments      Exercises     Assessment/Plan    PT Assessment Patient needs continued PT services  PT Problem List Decreased strength;Decreased range of motion;Decreased activity tolerance;Decreased balance;Decreased safety awareness;Decreased knowledge of use of DME;Cardiopulmonary status limiting activity       PT Treatment Interventions DME instruction;Gait training;Stair training;Functional mobility training;Therapeutic activities;Therapeutic exercise;Balance training;Neuromuscular re-education;Patient/family education    PT Goals (Current goals can be found in the Care Plan section)  Acute Rehab PT Goals Patient Stated Goal: go home PT Goal Formulation: With patient Time For Goal Achievement: 08/08/18 Potential to Achieve Goals: Good     Frequency Min 2X/week   Barriers to discharge        Co-evaluation               AM-PAC PT "6 Clicks" Mobility  Outcome Measure Help needed turning from your back to your side while in a flat bed without using bedrails?: None Help needed moving from lying on your back to sitting on the side of a flat bed without using bedrails?: None Help needed moving to and from a bed to a chair (including a wheelchair)?: None Help needed standing up from a chair using your arms (e.g., wheelchair or bedside chair)?: None Help needed to walk in hospital room?: A Little Help needed climbing 3-5 steps with a railing? : A Little 6 Click Score: 22    End of Session Equipment Utilized During Treatment: Gait belt Activity Tolerance: Patient tolerated treatment well Patient left: in bed;with call bell/phone within reach Nurse Communication: Mobility status PT Visit Diagnosis: Muscle weakness (generalized) (M62.81);Dizziness and giddiness (R42);Difficulty in walking, not elsewhere classified (R26.2)    Time: 1448-1856 PT Time Calculation (min) (ACUTE ONLY): 18 min   Charges:   PT Evaluation $PT Eval Low Complexity: 1 Low          Kreg Shropshire, DPT 07/25/2018, 11:36 AM

## 2018-07-25 NOTE — Plan of Care (Signed)
Nutrition Education Note  RD consulted for nutrition education regarding CHF.  84 year old male with hx of CAD, CKD Stage III, HTN, hx of Bigeminy who presented to the ED with increasing SOB, leg swelling, and weakness.  RD provided "Low Sodium Nutrition Therapy" handout from the Academy of [employer] and Dietetics. Reviewed patient's dietary recall. Provided examples on ways to decrease sodium intake in diet. Discouraged intake of processed foods and use of salt shaker. Encouraged fresh fruits and vegetables as well as whole grain sources of carbohydrates to maximize fiber intake.   RD discussed why it is important for patient to adhere to diet recommendations, and emphasized the role of fluids, foods to avoid, and importance of weighing self daily. Teach back method used.  Expect fair compliance.  Body mass index is 32.96 kg/m. Pt meets criteria for obesity based on current BMI.  Current diet order is HH, patient is consuming approximately 100% of meals at this time. Labs and medications reviewed. No further nutrition interventions warranted at this time. RD contact information provided. If additional nutrition issues arise, please re-consult RD.   Koleen Distance MS, RD, LDN Pager #- [phone] Office#- [phone] After Hours Pager: [phone]

## 2018-07-25 NOTE — Progress Notes (Signed)
Progress Note  Patient Name: Clinton Holmes Date of Encounter: 07/25/2018  Primary Cardiologist: Candee Furbish, MD   Subjective   Patient reported significant improvement in his shortness of breath today.  He is off supplemental nasal cannula oxygen.    Echo performed today with results below and heart failure education was being provided at the time of my entry into the room.    He is eager to go home and eat some pumpkin pie.  Inpatient Medications    Scheduled Meds: . clopidogrel  75 mg Oral Daily  . diltiazem  120 mg Oral Daily  . doxazosin  2 mg Oral Q12H  . furosemide  40 mg Intravenous Q12H  . lisinopril  40 mg Oral Daily  . multivitamin with minerals   Oral Daily  . sodium chloride flush  3 mL Intravenous Q12H   Continuous Infusions:  PRN Meds: acetaminophen **OR** acetaminophen, albuterol, hydrALAZINE, ondansetron **OR** ondansetron (ZOFRAN) IV, polyethylene glycol, sodium chloride flush   Vital Signs    Vitals:   07/25/18 0550 07/25/18 0700 07/25/18 0801 07/25/18 1000  BP: 138/79  (!) 154/57   Pulse: 73  (!) 45   Resp: 18  19   Temp: (!) 97.4 F (36.3 C)  (!) 97.4 F (36.3 C)   TempSrc: Axillary  Oral   SpO2: 97% 97% 92% 96%  Weight:      Height:        Intake/Output Summary (Last 24 hours) at 07/25/2018 1201 Last data filed at 07/25/2018 0900 Gross per 24 hour  Intake 48.61 ml  Output 2750 ml  Net -2701.39 ml   Filed Weights   07/24/18 1119  Weight: 110.2 kg    Telemetry    SR, PVCs, HR 60-70s- Personally Reviewed  ECG    No new tracings - Personally Reviewed  Physical Exam   GEN: No acute distress.  Lying in bed Neck: JVP~8. Hard of hearing in L ear. Cardiac: bradycardic but regular, 7-0/3 systolic murmurs, rubs, or gallops.  Respiratory: Coarse breath sounds bilaterally  GI: Soft, nontender MS: mild bilateral lower extremity edema; No deformity. Neuro:  Nonfocal  Psych: Normal affect   Labs    Chemistry Recent Labs  Lab  07/24/18 1531 07/25/18 0233  NA  --  140  K  --  3.8  CL  --  104  CO2  --  30  GLUCOSE  --  102*  BUN  --  30*  CREATININE 1.37* 1.77*  CALCIUM  --  9.2  GFRNONAA 46* 34*  GFRAA 53* 39*  ANIONGAP  --  6     Hematology Recent Labs  Lab 07/24/18 1158 07/24/18 1531 07/25/18 0233  WBC 10.2 9.1 6.8  RBC 4.84 4.72 4.45  HGB 14.0 13.6 12.7*  HCT 43.7 42.4 39.7  MCV 90.3 89.8 89.2  MCH 28.9 28.8 28.5  MCHC 32.0 32.1 32.0  RDW 14.4 14.2 14.3  PLT 206 200 168    Cardiac Enzymes Recent Labs  Lab 07/24/18 1158 07/24/18 1531 07/24/18 2126 07/25/18 0233  TROPONINI 0.03* 0.04* 0.05* 0.04*   No results for input(s): TROPIPOC in the last 168 hours.   BNP Recent Labs  Lab 07/24/18 1159  BNP 1,345.0*     DDimer No results for input(s): DDIMER in the last 168 hours.   Radiology    Dg Chest 2 View  Result Date: 07/24/2018 CLINICAL DATA:  Shortness of breath, generalized chest pain EXAM: CHEST - 2 VIEW COMPARISON:  09/06/2015 FINDINGS:  Cardiomegaly with vascular congestion. Interstitial prominence throughout the lungs, likely interstitial edema. Small bilateral pleural effusions. IMPRESSION: Cardiomegaly with vascular congestion and mild interstitial edema/CHF. Small bilateral effusions. Electronically Signed   By: Rolm Baptise M.D.   On: 07/24/2018 12:01    Cardiac Studies  TTE 07/25/18 Study Conclusions - Left ventricle: The cavity size was normal. There was mild   concentric hypertrophy. Systolic function was mildly reduced. The   estimated ejection fraction was in the range of 45% to 50%.   Images were inadequate for LV wall motion assessment. Doppler   parameters are consistent with abnormal left ventricular   relaxation (grade 1 diastolic dysfunction). - Aortic valve: There was moderate to severe stenosis. Mean   gradient (S): 26 mm Hg. Valve area (VTI): 0.76 cm^2. Peak   velocity ratio of LVOT to aortic valve: 0.27. - Left atrium: The atrium was mildly  dilated.   Echocardiogram 08/19/17: Normal EF, LVH present Moderate aortic stenosis as was previously seen on prior echocardiogram  Overall no change.  Study Conclusions - Left ventricle: The cavity size was normal. Wall thickness was increased in a pattern of severe LVH. Systolic function was normal. The estimated ejection fraction was in the range of 55% to 65%. Wall motion was normal; there were no regional wall motion abnormalities. Doppler parameters are consistent with abnormal left ventricular relaxation (grade 1 diastolic dysfunction). - Aortic valve: Mildly calcified annulus. Mildly thickened, moderately calcified leaflets. There was moderate stenosis. Valve area (VTI): 0.73 cm^2. Valve area (Vmax): 0.69 cm^2. Valve area (Vmean): 0.65 cm^2. - Left atrium: The atrium was mildly dilated. - Right atrium: The atrium was mildly dilated.   ECHO: 12/22/14  - Left ventricle: The cavity size was normal. There was moderate concentric hypertrophy. Systolic function was normal. The estimated ejection fraction was in the range of 55% to 60%. Wall motion was normal; there were no regional wall motion abnormalities. ncessant bigeminy limits evaluation of LV diastolic function. Doppler parameters are consistent with elevated mean left atrial filling pressure. - Aortic valve: There was moderate stenosis. There was trivial regurgitation. Valve area (VTI): 1.26 cm^2. Valve area (Vmax): 1.28 cm^2. Valve area (Vmean): 1.15 cm^2. - Mitral valve: There was mild to moderate regurgitation directed eccentrically and posteriorly. - Left atrium: The atrium was severely dilated. - Right ventricle: The cavity size was mildly dilated. Wall thickness was normal. - Pulmonary arteries: Systolic pressure was mildly increased. PA peak pressure: 39 mm Hg (S).   Cardiac catheterization 12/23/14   Widely patent coronary arteries with 45% proximal LAD narrowing. The ramus intermedius  branches and the more medial/anterior branch contains a 60% stenosis. No hemodynamically significant coronary lesions were identified.   No evidence of aortic stenosis   Mild pulmonary hypertension   Apparent mild global left ventricular hypokinesis although left ventriculography was of poor quality due to faint opacification.   Patient Profile     84 y.o. male  with a hx of mild CAD, HTN, AS, ventricular bigeminy, syncope, CKDIII, OA, remote h/o acoustic neuroma gamma knife procedure at [facility] in 1990s, history of smoking (quit in 1960s) who is being seen today for the evaluation of acute diastolic heart failure with volume overload.  Assessment & Plan    Acute systolic heart failure (16%-96%) -Volume status improving and patient reporting near baseline.  SOB and fatigue improved. Off O2 -Etiology of volume overload and SOB likely multifactorial with malignant hypertension on presentation, frequent PVCs, and known aortic valve stenosis -12/13 TTE showed new  mildly reduced EF of 45% to 50%.  Mild concentric hypertrophy.  G1DD.  Moderate to severe AS.  Mildly dilated LAE. -Net -2701.4 for the admission. Weight not updated today with baseline dry weight 243 lbs. Continue to monitor I's and O's/urinary output/weights. Daily BMET to monitor renal function, electrolytes. Cr bump overnight 1.37  1.77 with baseline ~1.3-1.4 -Received diuresis with IV Lasix 40 mg twice daily. Consider transitioning to oral diuretic. On CCB for BP control. Consider transitioning from CCB to BB given reduced EF above. -- Recommend new home diuretic at discharge. Will need outpatient follow-up in the clinic for HFrEF, AS/ outpatient follow-up labs. Recommend follow-up at the HF clinic. Lifestyle modifications recommended, HF education provided.  Elevated troponin with known CAD.  -No reported CP. Minimally elevated troponin in setting of volume overload and severely elevated BP and likely d/t supply demand ischemia. No  need for heparin drip. Continue medical management with PTA plavix, lisinopril. Lifestyle modifications to reduce risk factors recommended. No plans for ischemic work-up this admission.  Hypertension -BP elevated at 154/57; however, improving with diuresis. -Continue PTA medications of lisinopril 40 mg and diltiazem 120 mg daily. Started on Cardura 2mg  q12h with hydralazine 25 mg every 6 hours for SBP over 160. BP improving with diuresis. Consider transition from CCB to BB given reduced EF as above and as HR allows.  - Continue to monitor vitals and titrate antihypertensives as needed for optimal BP control -Recommend home BP monitoring and follow-up with PCP as outpatient   PVCs -Continue to monitor on telemetry -Consider outpatient holter monitor to count PVC burden as may be contributing to heart failure exacerbation.   Respiratory Distress  -Likely d/t volume overload, AS, and elevated BP. Weaned off O2. Recommend ambulation with walker in hall before discharge. Patient ambulates with walker at home. -Per IM  CKDIII -Monitor renal function and electrolytes with diuresis -Per IM   For questions or updates, please contact [place] Please consult [email or web address] for contact info under        Signed, Arvil Chaco, PA-C  07/25/2018, 12:01 PM

## 2018-07-25 NOTE — Care Management Note (Signed)
Case Management Note  Patient Details  Name: Clinton Holmes MRN: [id number] Date of Birth: 09/26/1927  Subjective/Objective:   Patient is from home alone.  Admitted with new onset CHF.  He is current with his PCP.  Denies difficulties obtaining medications or with medical care.  He uses a walker at home.  His daughters assist him if he cannot drive.  Does not have a functioning scale; this RNCM gave him a scale for discharge.  He has an appointment with the heart failure clinic.  He obtains his medications at [facility] in [place].  [facility] referral placed as he is in the registry.  Presented home health list with rating for [place], [place].  He has used [facility] in the past and would like to use them again.  Referral made to [facility] and accepted. Heart failure protocol, RN, PT, Aide.  Will continue to follow through patient discharge.                  Action/Plan:   Expected Discharge Date:  07/27/18               Expected Discharge Plan:  [place]  In-House Referral:     Discharge planning Services  CM Consult  Post Acute Care Choice:  Home Health Choice offered to:  Patient  DME Arranged:    DME Agency:     HH Arranged:  RN, PT, OT, Nurse's Aide [place] Agency:  [facility] ([facility])  Status of Service:  In process, will continue to follow  If discussed at Long Length of Stay Meetings, dates discussed:    Additional Comments:  Elza Rafter, RN 07/25/2018, 12:15 PM

## 2018-07-25 NOTE — Progress Notes (Addendum)
CCMD reported that pt rhythm was on ventricular bigemy consistently. On assessment pt was asymptomatic.  Notify priime. Will continue to monitor.  Update 2030: Talked to dr. Jannifer Franklin and states to monitor pt at this time and no new order was place. Will continue to monitor.

## 2018-07-25 NOTE — Progress Notes (Signed)
Cardiovascular and Pulmonary Nurse Navigator Note:    84 year old male with hx of CAD, CKD Stage III, HTN, hx of Bigeminy who presented to the ED with increasing SOB, leg swelling, and weakness.  Patient is a former smoker.   Patient admitted with dx of acute CHF new onset.  BNP 1345.  CXR consistent with pulmonary vascular congestion.    Echo done - results pending.    CHF Education:   Educational session with patient completed.  Provided patient with "Living Better with Heart Failure" packet. Briefly reviewed definition of heart failure and signs and symptoms of an exacerbation.?Explained to patient that HF is a chronic illness which requires self-assessment / self-management along with help from the cardiologist/PCP.?  *Reviewed importance of and reason behind checking weight daily in the AM, after using the bathroom, but before getting dressed. Patient provided scales by our RNCM.  RNCM demonstrated to patient how to use the scales.  ? *Reviewed with patient the following information: *Discussed when to call the Dr= weight gain of >2-3lb overnight of 5lb in a week,  *Discussed yellow zone= call MD: weight gain of >2-3lb overnight of 5lb in a week, increased swelling, increased SOB when lying down, chest discomfort, dizziness, increased fatigue *Red Zone= call 911: struggle to breath, fainting or near fainting, significant chest pain   *Diet-Reviewed low sodium diet-provided handout of recommended and not recommended foods.  Dietitian Consultation for low sodium heart health diet education ordered..  *Discussed fluid intake with patient as well. Patient not currently on a fluid restriction, but advised no more than 8-8 ounces glass of fluids per day.?Patient usually sips on a 32 ounce Gatorade, bottle of water, which he keeps by his chair.  Patient stated he has two of those per day.   ? *Instructed patient to take medications as prescribed for heart failure. Explained briefly why pt is on  the medications (either make you feel better, live longer or keep you out of the hospital) and discussed monitoring and side effects.  ? *Discussed exercise. Patient walks with a walker at home. RNCM has referred patient to [facility] for [facility] heart failure protocol, RN, PT, and Aide.   Patient  encouraged patient to remain as active as possible.   ? *Smoking Cessation- Patient is a former smoker.? ? *[facility] Heart Failure Clinic - Explained the purpose of the HF Clinic. ?Explained to patient the HF Clinic does not replace PCP nor Cardiologist, but is an additional resource to helping patient manage heart failure at home.  New patient appointment is scheduled for  08/12/2018 at 2 p.m.   Again, the 5 Steps to Living Better with Heart Failure were reviewed with patient.  ? Patient thanked me for providing the above information. ? ? Roanna Epley, RN, BSN, [facility]? [facility]  Cardiovascular &?Pulmonary Nurse Navigator  Direct Line: [phone]  Department Phone #: [phone] Fax: [phone]? Email Address: [email or web address]

## 2018-07-25 NOTE — Progress Notes (Signed)
*  PRELIMINARY RESULTS* Echocardiogram 2D Echocardiogram has been performed.  Clinton Holmes 07/25/2018, 11:23 AM

## 2018-07-26 LAB — BASIC METABOLIC PANEL
Anion gap: 9 (ref 5–15)
BUN: 39 mg/dL — ABNORMAL HIGH (ref 8–23)
CO2: 29 mmol/L (ref 22–32)
Calcium: 9.1 mg/dL (ref 8.9–10.3)
Chloride: 104 mmol/L (ref 98–111)
Creatinine, Ser: 1.75 mg/dL — ABNORMAL HIGH (ref 0.61–1.24)
GFR calc Af Amer: 39 mL/min — ABNORMAL LOW (ref >60)
GFR calc non Af Amer: 34 mL/min — ABNORMAL LOW (ref >60)
Glucose, Bld: 114 mg/dL — ABNORMAL HIGH (ref 70–99)
Potassium: 3.7 mmol/L (ref 3.5–5.1)
Sodium: 142 mmol/L (ref 135–145)

## 2018-07-26 MED ORDER — METOPROLOL SUCCINATE ER 50 MG PO TB24: 50.0000 mg | ORAL_TABLET | Freq: Every day | ORAL | 2 refills | Status: DC

## 2018-07-26 MED ORDER — FUROSEMIDE 20 MG PO TABS: 20.0000 mg | ORAL_TABLET | Freq: Every day | ORAL | 11 refills | Status: DC

## 2018-07-26 NOTE — Discharge Summary (Signed)
[place] at [facility], 84 y.o., DOB 1928-04-06, MRN [id number]. Admission date: 07/24/2018 Discharge Date 07/26/2018 Primary MD Patient, No Pcp Per Admitting Physician Fritzi Mandes, MD  Admission Diagnosis  Dyspnea, unspecified type [R06.00]  Discharge Diagnosis   Active Problems: Acute congestive heart failure diastolic in nature new onset Acute kidney injury on chronic kidney disease stage III Hypertension Ventricular bigeminy History of coronary artery disease      Hospital Course  Clinton Holmes a16 y.o.malewith a known history of CAD, chronic kidney disease stage III, hypertension, history of ventricularBigeminicomes to the emergency room accompanied by daughter with increasing shortness of breath leg swelling and weakness.  Patient was evaluated in the emergency room and noted to have acute CHF.  Patient's echo revealed diastolic dysfunction.  He was treated with Lasix with resolution of his symptoms.  He was also seen by cardiology who will change his Cardizem to Toprol-XL.  Patient also is having frequent bigeminy's which is chronic for this patient.  He is weak therefore home health will be arranged.          Consults  cardiology  Significant Tests:  See full reports for all details     Dg Chest 2 View  Result Date: 07/24/2018 CLINICAL DATA:  Shortness of breath, generalized chest pain EXAM: CHEST - 2 VIEW COMPARISON:  09/06/2015 FINDINGS: Cardiomegaly with vascular congestion. Interstitial prominence throughout the lungs, likely interstitial edema. Small bilateral pleural effusions. IMPRESSION: Cardiomegaly with vascular congestion and mild interstitial edema/CHF. Small bilateral effusions. Electronically Signed   By: Rolm Baptise M.D.   On: 07/24/2018 12:01       Today   Subjective:   Clinton Holmes patient is breathing much improved and is at baseline now Objective:   Blood pressure 140/65, pulse 77,  temperature 98.2 F (36.8 C), temperature source Oral, resp. rate 20, height 6' (1.829 m), weight 110.2 kg, SpO2 93 %.  .  Intake/Output Summary (Last 24 hours) at 07/26/2018 1419 Last data filed at 07/26/2018 1000 Gross per 24 hour  Intake 600 ml  Output 550 ml  Net 50 ml    Exam VITAL SIGNS: Blood pressure 140/65, pulse 77, temperature 98.2 F (36.8 C), temperature source Oral, resp. rate 20, height 6' (1.829 m), weight 110.2 kg, SpO2 93 %.  GENERAL:  84 y.o.-year-old patient lying in the bed with no acute distress.  EYES: Pupils equal, round, reactive to light and accommodation. No scleral icterus. Extraocular muscles intact.  HEENT: Head atraumatic, normocephalic. Oropharynx and nasopharynx clear.  NECK:  Supple, no jugular venous distention. No thyroid enlargement, no tenderness.  LUNGS: Normal breath sounds bilaterally, no wheezing, rales,rhonchi or crepitation. No use of accessory muscles of respiration.  CARDIOVASCULAR: S1, S2 normal. No murmurs, rubs, or gallops.  ABDOMEN: Soft, nontender, nondistended. Bowel sounds present. No organomegaly or mass.  EXTREMITIES: No pedal edema, cyanosis, or clubbing.  NEUROLOGIC: Cranial nerves II through XII are intact. Muscle strength 5/5 in all extremities. Sensation intact. Gait not checked.  PSYCHIATRIC: The patient is alert and oriented x 3.  SKIN: No obvious rash, lesion, or ulcer.   Data Review     CBC w Diff:  Lab Results  Component Value Date   WBC 6.8 07/25/2018   HGB 12.7 (L) 07/25/2018   HCT 39.7 07/25/2018   PLT 168 07/25/2018   LYMPHOPCT 9 07/24/2018   MONOPCT 5 07/24/2018   EOSPCT 1 07/24/2018   BASOPCT 1 07/24/2018   CMP:  Lab  Results  Component Value Date   NA 142 07/26/2018   K 3.7 07/26/2018   CL 104 07/26/2018   CO2 29 07/26/2018   BUN 39 (H) 07/26/2018   CREATININE 1.75 (H) 07/26/2018   PROT 7.2 04/04/2015   ALBUMIN 4.3 04/04/2015   BILITOT 0.5 04/04/2015   ALKPHOS 66 04/04/2015   AST 17  04/04/2015   ALT 13 04/04/2015  .  Micro Results No results found for this or any previous visit (from the past 240 hour(s)).      Code Status Orders  (From admission, onward)         Start     Ordered   07/24/18 1516  Limited resuscitation (code)  Continuous    Question Answer Comment  In the event of cardiac or respiratory ARREST: Initiate Code Blue, Call Rapid Response Yes   In the event of cardiac or respiratory ARREST: Perform CPR Yes   In the event of cardiac or respiratory ARREST: Perform Intubation/Mechanical Ventilation No   In the event of cardiac or respiratory ARREST: Use NIPPV/BiPAp only if indicated Yes   In the event of cardiac or respiratory ARREST: Administer ACLS medications if indicated Yes   In the event of cardiac or respiratory ARREST: Perform Defibrillation or Cardioversion if indicated Yes      07/24/18 1515        Code Status History    Date Active Date Inactive Code Status Order ID Comments User Context   12/23/2014 1556 12/24/2014 1532 Full Code [id number]  Belva Crome, MD Inpatient   12/21/2014 1841 12/23/2014 1556 Full Code [id number]  Rogelia Mire, NP Inpatient          Follow-up Information    [facility] Follow up on 08/12/2018.   Specialty:  Cardiology Why:  at 2:00pm Contact information: [address] [place]       Jerline Pain, MD Follow up in 6 day(s).   Specialty:  Cardiology Why:  hosp f/u check bmp Contact information: [address] [address] [phone]        Marrianne Mood D, PA-C Follow up in 2 week(s).   Specialties:  Physician Assistant, Cardiology Why:  hosp f/u chf Contact information: [place] [address] [phone]           Discharge Medications   Allergies as of 07/26/2018   No Known Allergies     Medication List     STOP taking these medications   diltiazem 120 MG 24 hr capsule Commonly known as:  CARDIZEM CD     TAKE these medications   clopidogrel 75 MG tablet Commonly known as:  PLAVIX Take 1 tablet (75 mg total) by mouth daily.   furosemide 20 MG tablet Commonly known as:  LASIX Take 1 tablet (20 mg total) by mouth daily.   lisinopril 40 MG tablet Commonly known as:  PRINIVIL,ZESTRIL Take 1 tablet by mouth daily.   metoprolol succinate 50 MG 24 hr tablet Commonly known as:  TOPROL-XL Take 1 tablet (50 mg total) by mouth daily. Take with or immediately following a meal.   MULTIVITAMIN ADULTS 50+ PO Take 1 tablet by mouth daily.   STOOL SOFTENER PO Take 1 tablet by mouth daily.          Total Time in preparing paper work, data evaluation and todays exam - 35 minutes  Alexandrya Chim  Posey Pronto M.D on 07/26/2018 at 2:19 PM [employer]   Office  [phone]

## 2018-07-26 NOTE — Plan of Care (Signed)
  Problem: Education: Goal: Knowledge of General Education information will improve Description Including pain rating scale, medication(s)/side effects and non-pharmacologic comfort measures Outcome: Progressing   Problem: Health Behavior/Discharge Planning: Goal: Ability to manage health-related needs will improve Outcome: Progressing   Problem: Clinical Measurements: Goal: Respiratory complications will improve Outcome: Progressing   Problem: Activity: Goal: Risk for activity intolerance will decrease Outcome: Progressing   Problem: Activity: Goal: Capacity to carry out activities will improve Outcome: Progressing

## 2018-07-26 NOTE — Care Management Note (Signed)
Case Management Note  Patient Details  Name: Clinton Holmes MRN: [id number] Date of Birth: 1928-02-03  Subjective/Objective:   Patient to be discharged per MD order. Orders in place for home health services. Previous RNCM had began workup for discharge with home health using the CHF protocol via [facility]. Confirmed Referral with Helene Kelp. Faxed signed protocol form. Patient has no further needs.                   Action/Plan:   Expected Discharge Date:  07/26/18               Expected Discharge Plan:  [place]  In-House Referral:     Discharge planning Services  CM Consult  Post Acute Care Choice:  Home Health Choice offered to:  Patient  DME Arranged:    DME Agency:     HH Arranged:  RN, PT, OT, Nurse's Aide [place] Agency:  [facility] (now [facility])  Status of Service:  Completed, signed off  If discussed at [place] of Stay Meetings, dates discussed:    Additional Comments:  Latanya Maudlin, RN 07/26/2018, 10:36 AM

## 2018-07-28 ENCOUNTER — Telehealth: Payer: Self-pay | Admitting: Cardiology

## 2018-07-28 ENCOUNTER — Encounter: Payer: Self-pay | Admitting: *Deleted

## 2018-07-28 ENCOUNTER — Other Ambulatory Visit: Payer: Self-pay | Admitting: *Deleted

## 2018-07-28 DIAGNOSIS — I10 Essential (primary) hypertension: Secondary | ICD-10-CM

## 2018-07-28 DIAGNOSIS — Z79899 Other long term (current) drug therapy: Secondary | ICD-10-CM

## 2018-07-28 NOTE — Telephone Encounter (Signed)
Attempted to call and schedule appointment with Dr. Marlou Porch this Friday.   Left message to call back.

## 2018-07-28 NOTE — Telephone Encounter (Signed)
Spoke with the patient who said that he is not able to come in this Friday 10/20 for OV with Dr Marlou Porch.  His daughter is attempting to make an appt in the [place] office.  They will contact us if they need further assistance.

## 2018-07-28 NOTE — Patient Outreach (Addendum)
[facility]) Care Management  07/28/2018  Clinton Holmes February 19, 1928 [id number]   Initial telephone outreach   Referral received 12/13 Referral source : [facility] Case Manager Referral reason : Patient with admission 12/12-12/14 Dx: CHF.  Insurance : Medicare   PMHx. Includes Lacunar stroke, HTN, CKD, ischemic optic neuropathy,     Outreach call to patient , unsuccessful no answer able to leave a HIPAA compliant message for return call.   Plan Will send unsuccessful outreach letter to patient and plan return call in the next 4 business days.    [place] call to patient emergency contact/DPR, daughter  Clinton Holmes, explained reason for the call, [facility] care management services.  HIPAA verified . Patient daughter discussed that visited patient this morning to help him get dressed for the day. Daughter discussed that she manages patient medications and keeps them in a pill organizer. Patient was recently discharged from hospital and all medications have been reviewed. She discussed that patient goes out to lunch every Monday and Friday at [place] in [facility] , she reports that he drove himself. Daughter recommended attempting to call patient again later today or tomorrow.  Appointments Patient daughter discussed patient discharge instructions and recommendations for follow up visit in the next 6 days with Dr.Skains for lab work and she has contacted office for visit.  Daughter discussed [name], PA as patient PCP, and she will attempt getting an appointment with him also,she wondered if all the appointments are necessary. Explained reason for follow up after hospital,.  She discussed the follow up visit recommendations with [name] PA , cardiology  in 2 weeks and heart failure clinic on 12/31.    Return call to patient , explained reason for the call, [facility] care management services.  Patient discussed is recent admission to hospital with fluid build up problem.   Patient further discussed: Conditions  Patient discussed problem with balance, hearing and vision on the left side related to left acoustic neuroma. Patient discussed difficulty with balance,but using a walker and feels safer.  Patient reports that he has weighed daily since being at home, today's weight 238.6 . Reviewed worsening  Heart failure symptoms, of weight gain of 3 lbs in day, in creased swelling, shortness of breath. Discussed importance of limiting salt in diet,not using salt shaker, processed foods . Patient discussed having sausage roll for breakfast on today.  Patient went out to lunch ordering grilled chicken , with no salt seasoning.  Social  Patient lives at home alone, he has a daughter that lives nearby and assist patient as needed with food preparation and keeping easy to heat up food for him.  Patient does drive short distance in area. He reports being able to manage showering on his own his daughter comes at least once a week with applying lotion to his back.  Patient states that he has not received a phone call regarding, home health services yet.   Appointments  Patient states that his PCP was Dr.Mumaw that is no longer at [facility], he states that another MD is coming to the practice. Placed call to [facility] [place], to verify patient PCP name for record,  spoke with [name]  she verified a  temporary, MD is coming to practice on 12/17. She states that patient  has a 6 month follow up appointment with [name] NP on 1/2.  Discussed scheduling follow up visits with PCP, he states that his daughter handles all of that ,  discussed with patient that I have spoken with his daughter regarding follow up visits needed.  Patient is agreeable to [facility] care management services, patient is agreeable to follow up call in the 2 weeks and home visit after the holidays.    Plan  Will plan follow up call in the next week and discuss initial home visit.  Will verify PCP to  send involvement letter to.  Will send patient [facility] calendar.  THN CM Care Plan Problem One     Most Recent Value  Care Plan Problem One  High risk for admission related to recent hospital admission for Heart failure .   Role Documenting the Problem One  Care Management [place] for Problem One  Active  THN Long Term Goal   Patient will not experience a hospital admission over the next 31 days   THN Long Term Goal Start Date  07/28/18  Interventions for Problem One Long Term Goal  Discussed recent discharge instructions, taking medications, notifying MD of worsening symptoms to arrange a sooner visit.   THN CM Short Term Goal #1   Patient will attend all medical appointments over the next 30 days   THN CM Short Term Goal #1 Start Date  07/28/18  Interventions for Short Term Goal #1  Advised regarding the importance of attending all medical appointments,explained reason  for follow up . explained heart failure clinic follow up .Encouraged scheduling sooner PCP office visit  than already scheduled visit 1/2.   THN CM Short Term Goal #2   Patient will be able to verbalize at least 2 symptoms of worsening Heart failure within the next 30 days   THN CM Short Term Goal #2 Start Date  07/28/18  Interventions for Short Term Goal #2  Advised regarding yellow zone symptoms of heart failure,, increase in swelling shortness of breath, weight gain of 3 pounds in a day  or 5 in a week. ,   THN CM Short Term Goal #3  Over the next 30 days patient will be able to report weighing daily and keeping a record.   THN CM Short Term Goal #3 Start Date  07/28/18  Interventions for Short Tern Goal #3  Advised patient regarding best time of day to weigh , in the morning , after going to bathroom and before getting dressed.      [name], RN, [place] Management Coordinator  [phone]- Mobile [phone]- Toll Free Main Office

## 2018-07-28 NOTE — Telephone Encounter (Signed)
New Message   Pt's wife is calling, states that he was in the hospital and per discharge note he needs to be seen 6 days since his discharge, nothing available for Dr. Marlou Porch or PA. Pt states she would like to speak with nurse. Please call

## 2018-07-29 NOTE — Telephone Encounter (Signed)
Patient is coming to see Clinton Faith, PA on 08/12/18. Patient's discharge instructions say for patient to have BMET in 6 days. Patient discharged on 07/26/18 so that means BMET should be this Friday. Routing to [employer] to verify if ok to order BMET by him.

## 2018-07-29 NOTE — Telephone Encounter (Signed)
Spoke with patient's daughter, [name], ok per DPR. She verbalized understanding to bring patient to the [facility] on Friday, 120/20/19 for lab work.

## 2018-07-29 NOTE — Telephone Encounter (Signed)
Okay to draw BMP.

## 2018-07-29 NOTE — Addendum Note (Signed)
Addended by: Vanessa Ralphs on: 07/29/2018 11:35 AM   Modules accepted: Orders

## 2018-07-30 DIAGNOSIS — I509 Heart failure, unspecified: Secondary | ICD-10-CM | POA: Diagnosis not present

## 2018-07-30 DIAGNOSIS — I10 Essential (primary) hypertension: Secondary | ICD-10-CM | POA: Diagnosis not present

## 2018-07-30 DIAGNOSIS — N183 Chronic kidney disease, stage 3 (moderate): Secondary | ICD-10-CM | POA: Diagnosis not present

## 2018-07-30 DIAGNOSIS — Z09 Encounter for follow-up examination after completed treatment for conditions other than malignant neoplasm: Secondary | ICD-10-CM | POA: Diagnosis not present

## 2018-07-30 DIAGNOSIS — E782 Mixed hyperlipidemia: Secondary | ICD-10-CM | POA: Diagnosis not present

## 2018-08-04 ENCOUNTER — Ambulatory Visit: Payer: Self-pay | Admitting: *Deleted

## 2018-08-07 ENCOUNTER — Other Ambulatory Visit: Payer: Self-pay | Admitting: *Deleted

## 2018-08-07 NOTE — Patient Outreach (Signed)
[facility]) Care Management  08/07/2018  Clinton Holmes 03-19-1928 [id number]   Telephone assessment    Follow up call to patient , he reports that he is doing well.  Patient discussed continuing to monitor weights at home staying in the 237 range without sudden weight increase of 2 pounds in a day or 5 in a week, patient denies shortness of breath or swelling.   Followed up with patient regarding home health, he states that he hasn't received a phone call yet, and states that he is doing pretty good without them.   Discussed home visit with patient, he is agreeable to me contacting her daughter to notify of home visit.  Patient verifies Charlott Holler NP as his PCP, and has an appointment on 1/2 as previously discussed with office.    Plan Will plan initial home visit in the next day, for continued goal planning and assessments. Placed call to daughter to update on scheduled home visit, she is in agreement.  Placed call [facility] regarding discharge referral for home health services, spoke with Jenny Reichmann that will follow up, states they attempted to set up visit on 12/18, patient not available.    [name], RN, [place] Management Coordinator  [phone]- Mobile [phone]- Toll Free Main Office

## 2018-08-08 ENCOUNTER — Encounter: Payer: Self-pay | Admitting: Physician Assistant

## 2018-08-08 ENCOUNTER — Other Ambulatory Visit: Payer: Self-pay | Admitting: *Deleted

## 2018-08-08 NOTE — Patient Outreach (Addendum)
[facility]) Care Management   08/08/2018  Clinton Holmes September 09, 1927 [id number]  Initial home visit    Clinton Holmes is an 84 y.o. male   Referral received 12/13 Referral source : [facility] Case Manager Referral reason : Patient with admission 12/12-12/14 Dx: CHF.  Insurance : Medicare   PMHx. Includes Lacunar stroke, HTN, CKD, ischemic optic neuropathy,    Subjective:  Patient reports feeling pretty good on today.  Patient states that he is too old to be told what to do , what to change in eating.   Objective:  BP 140/80 (BP Location: Left Arm, Patient Position: Sitting, Cuff Size: Normal)   Pulse 68   Resp 18   Ht 1.829 m (6')   Wt 238 lb (108 kg)   SpO2 96%   BMI 32.28 kg/m  Noted ambulation in home with walker .  Review of Systems  Constitutional: Negative.   HENT: Negative.        Limited hearing left ear   Eyes:       Limited vision left ear   Respiratory: Negative for shortness of breath.   Cardiovascular: Positive for leg swelling.       Chronic swelling right ankle  Gastrointestinal: Negative.   Genitourinary: Negative.   Musculoskeletal: Negative.   Skin: Negative.   Neurological:       Complaint of equilibrium being off   Endo/Heme/Allergies: Negative.   Psychiatric/Behavioral: Negative.     Physical Exam  Constitutional: He is oriented to person, place, and time. He appears well-developed and well-nourished.  Cardiovascular: Normal rate, normal heart sounds and intact distal pulses.  Respiratory: Effort normal and breath sounds normal.  GI: Soft. Bowel sounds are normal.  Neurological: He is alert and oriented to person, place, and time.  Skin: Skin is warm and dry.  Psychiatric: He has a normal mood and affect. His behavior is normal. Judgment and thought content normal.    Encounter Medications:   Outpatient Encounter Medications as of 08/08/2018  Medication Sig  . clopidogrel (PLAVIX) 75 MG tablet Take 1 tablet (75 mg  total) by mouth daily.  Mariane Baumgarten Calcium (STOOL SOFTENER PO) Take 1 tablet by mouth daily.  . furosemide (LASIX) 20 MG tablet Take 1 tablet (20 mg total) by mouth daily.  Marland Kitchen lisinopril (PRINIVIL,ZESTRIL) 40 MG tablet Take 1 tablet by mouth daily.  . metoprolol succinate (TOPROL-XL) 50 MG 24 hr tablet Take 1 tablet (50 mg total) by mouth daily. Take with or immediately following a meal.  . Multiple Vitamins-Minerals (MULTIVITAMIN ADULTS 50+ PO) Take 1 tablet by mouth daily.   No facility-administered encounter medications on file as of 08/08/2018.     Functional Status:   In your present state of health, do you have any difficulty performing the following activities: 07/28/2018 07/24/2018  Hearing? 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 N N  Walking or climbing stairs? Y Y  Comment - vertigo, and poor balance  Dressing or bathing? Y N  Doing errands, shopping? [name]  Preparing Food and eating ? Y -  Comment famiy assist -  Using the Toilet? N -  In the past six months, have you accidently leaked urine? Y -  Do you have problems with loss of bowel control? N -  Managing your Medications? Y -  Managing your Finances? N -  Housekeeping or  managing your Housekeeping? Y -  Comment family assist -  Some recent data might be hidden    Fall/Depression Screening:    Fall Risk  08/08/2018 07/28/2018  Falls in the past year? - 1  Number falls in past yr: - 1  Injury with Fall? - 1  Risk for fall due to : Impaired balance/gait;History of fall(s) -  Follow up Falls prevention discussed;Education provided -   PHQ 2/9 Scores 07/28/2018  PHQ - 2 Score 0    Assessment:  Initial home visit patient daughter [name] present.   Heart failure  Patient weight today is 238, reviewed recorded weights, no sudden weight gain greater than 3 pounds in a day or 5 in  Week, will benefit from continued  education and support on Heart failure self care management, including daily salt limits, fluid limits.  Patient has removed salt shaker, daughter assisting with some meal prep, patient has Mrs.Dash seasoning to use at home.  Patient reports limiting fluids to 2 quarts a day.  Patient declines FLU and pneumonia vaccines states that he does not take them.  Fall Risk  High fall risk due to chronic equilibrium problem per patient report, using walker in home.  Patient able to perform ADL's in home daughter assist with showering twice weekly , Patient continues to drive locally  As prior to admission .  Hypertension - patient has blood pressure monitoring at home but denies need for self monitoring.  Appointments : patient has attended PCP visit on 12/18, has Heart failure clinic and cardiology visit on 12/31, daughter will provide transportation.    Plan:  [facility] welcome packet provided and reviewed, [facility] consent signed Provided EMMI on preventing falls in the elderly,reviewed fall prevention measures.   Heart failure what to do,EMMI provided  Will send Living with heart failure packet.   Placed call to [facility] to follow up on home health services, reports they have contacted PCP office for new orders and will contact patient , and daughter updated at visit.   Will send PCP visit note.  Will plan telephone follow up call in the next 3 weeks.    THN CM Care Plan Problem One     Most Recent Value  Care Plan Problem One  High risk for admission related to recent hospital admission for Heart failure .   Role Documenting the Problem One  Care Management [place] for Problem One  Active  THN Long Term Goal   Patient will not experience a hospital admission over the next 31 days   THN Long Term Goal Start Date  07/28/18  Interventions for Problem One Long Term Goal  Home visit completed, Discussed living better with heart failure, emphasized importance of taking medications daily,  knowing how he feels, weighing daily , being as active as possible, will send Living with heart packet.   THN CM Short Term Goal #1   Patient will attend all medical appointments over the next 30 days   THN CM Short Term Goal #1 Start Date  07/28/18  Interventions for Short Term Goal #1  Reviewed upcoming cardiology and PCP visits .   THN CM Short Term Goal #2   Patient will be able to verbalize at least 2 symptoms of worsening Heart failure within the next 30 days   THN CM Short Term Goal #2 Start Date  07/28/18  Interventions for Short Term Goal #2  Reviewed Heart failure zone chart, emphasized importance of action plan  of calling MD if yellow zone symptoms noted. Provided EMMI on HF when to call.    THN CM Short Term Goal #3  Over the next 30 days patient will be able to report weighing daily and keeping a record.   THN CM Short Term Goal #3 Start Date  07/28/18  Interventions for Short Tern Goal #3  Reviewed weight log, reinforced best time of day to weigh reviewed rationale, provided and reviewed how to use [facility] calendar for weight log., encouraged to take log to cardiology and HF clinic visit   THN CM Short Term Goal #4  Patient will be able to discuss high salt foods to limit in diet over the next 30 days   THN CM Short Term Goal #4 Start Date  08/08/18  Interventions for Short Term Goal #4  Provided high low salt food handout, discussed daily limits of 2 grams of salt, review of label reading , Reviewed daily fluid limit of 2 liters, explained reason for limiting salt and fluid to manage heart failure.      [name], RN, [place] Management Coordinator  [phone]- Mobile [phone]- Toll Free Main Office

## 2018-08-08 NOTE — Progress Notes (Signed)
Cardiology Office Note Date:  08/12/2018  Patient ID:  Clinton Holmes, DOB [birth year], MRN [id number] PCP:  Philmore Pali, NP  Cardiologist:  Dr. Marlou Porch, MD    Chief Complaint: Hospital follow up  History of Present Illness: Clinton Holmes is a 84 y.o. male with history of nonobstructive CAD by cath in 2016, chronic combined systolic and diastolic CHF, moderate to severe aortic stenosis, syncope, CKD stage III, frequent PVCs, TIA/stroke with left eye vision changes, acoustic neuroma status post gamma knife procedure at [facility] in the 1990s with subsequent MRI showing no evidence of neuroma, and HTN who presents for hospital follow up from 12/12 to 12/14 for acute on chronic combined CHF and malignant hypertension.   Prior echo in 12/2014 showed an EF of 55 to 60%, moderate concentric LVH, normal wall motion, moderate aortic stenosis, trivial aortic insufficiency, mild to moderate mitral regurgitation directed eccentrically and posteriorly, severely dilated left atrium, mildly dilated right ventricle, PASP 39 mmHg.  In the setting of the patient's aortic stenosis, he underwent right and left cardiac cath in 12/2014 that showed widely patent coronary arteries with 45% proximal LAD stenosis.  The ramus intermedius branches and more medial/anterior branch contained 60% stenosis.  There was no hemodynamically significant coronary artery disease identified.  There was no evidence of aortic stenosis.  He did have mild pulmonary hypertension.  He has been medically managed.  In 07/2015 he had a left eye visual defect leading to the stopping of his amiodarone.  He was seen by neurology and started on Plavix and Lipitor for potential stroke causing visual field defect.  Patient has a history of falls/syncope with each episode having warning signs consisting of feeling tired, fatigued, lightheaded, sweaty, and nauseous.  He was last seen in the office on 11/25/2017 and noted that disequilibrium was his main complaint.   He was ambulating with a walker.  He was otherwise doing well.  In the setting of his falls/syncope in the past, his antihypertensive have been tapered however this led to a significant increase in his blood pressures.  With regards to his PVCs he was continued on Cardizem.  Patient was recently admitted to the hospital in mid 07/2018 with acute hypoxic respiratory distress felt to be secondary to malignant hypertension with a BP of 199/129, acute on chronic combined systolic and diastolic CHF, frequent PVCs occasionally in a pattern of ventricular bigeminy, and aortic valve stenosis.  He was IV diuresed with symptom improvement.  Troponin was minimally elevated with a peak value of 0.05.  Echo on 07/25/2018 showed an EF of 45 to 50%, mild concentric LVH, grade 1 diastolic dysfunction, moderate to severe aortic stenosis with a mean gradient of 26 mmHg and a valve area of 0.76 cm, mildly dilated left atrium.  At discharge, the patient's blood pressure had improved to 140/65.  Discharge weight 110.2 kg.  Discharge labs: Potassium 3.7, serum creatinine 1.75, WBC 6.8, hemoglobin 12.7  Discharge medications: Medication List    STOP taking these medications   diltiazem 120 MG 24 hr capsule Commonly known as:  CARDIZEM CD     TAKE these medications   clopidogrel 75 MG tablet Commonly known as:  PLAVIX Take 1 tablet (75 mg total) by mouth daily.   furosemide 20 MG tablet Commonly known as:  LASIX Take 1 tablet (20 mg total) by mouth daily.   lisinopril 40 MG tablet Commonly known as:  PRINIVIL,ZESTRIL Take 1 tablet by mouth daily.   metoprolol succinate 50  MG 24 hr tablet Commonly known as:  TOPROL-XL Take 1 tablet (50 mg total) by mouth daily. Take with or immediately following a meal.   MULTIVITAMIN ADULTS 50+ PO Take 1 tablet by mouth daily.   STOOL SOFTENER PO Take 1 tablet by mouth daily.   Outpatient follow-up labs drawn on 07/30/2018: TC 151, LDL 96, TG 60, WBC 7.4, Hgb  13.3 serum creatinine 1.58, potassium 4.7, albumin 4.4, AST/ALT normal.  Patient comes in accompanied by his oldest daughter today.  He has done well since his hospital discharge.  He denies any chest pain, shortness of breath, lower extremity swelling, abdominal distention, orthopnea, or early satiety.  Patient reports she has been sleeping in a recliner for the past 2 months though this is not in the setting of orthopnea.  The patient states "I am just too lazy to go to the bed."  He reports he was able to lay fully supine while in the hospital without any issues.  He has longstanding swelling of the right lower extremity that dates back to the 1970s in the setting of trauma.  He denies any dizziness, presyncope, or syncope.  No chest pain.  Weight at home has been stable between 236 and 238 pounds.  He is not checking his blood pressure at home.  No falls since his discharge.  No BRBPR or melena.  He is tolerating medications without any issues.  He denies any symptoms of palpitations.  He has not taken a Lipitor since 2016 secondary to possible myalgias.  He is eating a heart healthy diet that is low in sodium though is somewhat disgruntled by this.  He does continue to eat out at restaurants 3 times per week, typically for lunch on Mondays and Fridays and breakfast on Saturday mornings.  Past Medical History:  Diagnosis Date  . CAD (coronary artery disease)    L&RHC 12/23/2014 elevated LVEDP, prox LAD 45%, 60% ramus  . CKD (chronic kidney disease), stage III ([place])   . Essential hypertension   . Murmur   . OA (osteoarthritis)    a. 2011 s/p bilat TKA.  . Ventricular bigeminy    a. 12/2014. Started on amio on 12/24/2014    Past Surgical History:  Procedure Laterality Date  . CARDIAC CATHETERIZATION N/A 12/23/2014   Procedure: Right/Left Heart Cath and Coronary Angiography;  Surgeon: Belva Crome, MD;  Location: [facility];  Service: Cardiovascular;  Laterality: N/A;  . KNEE SURGERY       Current Meds  Medication Sig  . clopidogrel (PLAVIX) 75 MG tablet Take 1 tablet (75 mg total) by mouth daily.  Mariane Baumgarten Calcium (STOOL SOFTENER PO) Take 1 tablet by mouth daily.  . furosemide (LASIX) 20 MG tablet Take 1 tablet (20 mg total) by mouth daily.  Marland Kitchen lisinopril (PRINIVIL,ZESTRIL) 40 MG tablet Take 1 tablet by mouth daily.  . metoprolol succinate (TOPROL-XL) 50 MG 24 hr tablet Take 1 tablet (50 mg total) by mouth daily. Take with or immediately following a meal.  . Multiple Vitamins-Minerals (MULTIVITAMIN ADULTS 50+ PO) Take 1 tablet by mouth daily.    Allergies:   Patient has no known allergies.   Social History:  The patient  reports that he has quit smoking. He has never used smokeless tobacco. He reports that he does not drink alcohol or use drugs.   Family History:  The patient's family history includes CAD in his mother; Congestive Heart Failure in his sister; Heart attack in his father; Other  in his brother.  ROS:   Review of Systems  Constitutional: Positive for malaise/fatigue. Negative for chills, diaphoresis, fever and weight loss.  HENT: Negative for congestion.   Eyes: Negative for discharge and redness.  Respiratory: Negative for cough, hemoptysis, sputum production, shortness of breath and wheezing.   Cardiovascular: Positive for leg swelling. Negative for chest pain, palpitations, orthopnea, claudication and PND.       Chronic swelling of the right lower extremity secondary to trauma in the 1970s.  Gastrointestinal: Negative for abdominal pain, blood in stool, heartburn, melena, nausea and vomiting.  Genitourinary: Negative for hematuria.  Musculoskeletal: Negative for falls and myalgias.  Skin: Negative for rash.  Neurological: Positive for weakness. Negative for dizziness, tingling, tremors, sensory change, speech change, focal weakness and loss of consciousness.  Endo/Heme/Allergies: Does not bruise/bleed easily.  Psychiatric/Behavioral: Negative for  substance abuse. The patient is not nervous/anxious.   All other systems reviewed and are negative.    PHYSICAL EXAM:  VS:  BP 120/82 (BP Location: Left Arm, Patient Position: Sitting, Cuff Size: Normal)   Pulse 61   Ht 6' (1.829 m)   Wt 238 lb 8 oz (108.2 kg)   BMI 32.35 kg/m  BMI: Body mass index is 32.35 kg/m.  Physical Exam  Constitutional: He is oriented to person, place, and time. He appears well-developed and well-nourished.  HENT:  Head: Normocephalic and atraumatic.  Eyes: Right eye exhibits no discharge. Left eye exhibits no discharge.  Neck: Normal range of motion. No JVD present.  Cardiovascular: Normal rate, S1 normal and S2 normal. An irregular rhythm present. Exam reveals no distant heart sounds, no friction rub, no midsystolic click and no opening snap.  Murmur heard.  Harsh midsystolic murmur is present with a grade of 3/6 at the upper right sternal border radiating to the neck. Pulses:      Posterior tibial pulses are 2+ on the right side and 2+ on the left side.  Frequent ectopy noted  Pulmonary/Chest: Effort normal and breath sounds normal. No respiratory distress. He has no decreased breath sounds. He has no wheezes. He has no rales. He exhibits no tenderness.  Abdominal: Soft. He exhibits no distension. There is no abdominal tenderness.  Musculoskeletal:        General: Edema present.     Comments: 1+ edema noted along the right lower extremity  Neurological: He is alert and oriented to person, place, and time.  Skin: Skin is warm and dry. No cyanosis. Nails show no clubbing.  Psychiatric: He has a normal mood and affect. His speech is normal and behavior is normal. Judgment and thought content normal.     EKG:  Was ordered and interpreted by me today. Shows NSR, 61 bpm, frequent PVCs occasionally in a pattern of ventricular bigeminy, LVH, possible prior septal infarct, nonspecific lateral ST-T changes  Recent Labs: 07/24/2018: B Natriuretic Peptide  1,345.0; Magnesium 2.0; TSH 1.368 07/25/2018: Hemoglobin 12.7; Platelets 168 07/26/2018: BUN 39; Creatinine, Ser 1.75; Potassium 3.7; Sodium 142  07/24/2018: Cholesterol 148; HDL 37; LDL Cholesterol 103; Total CHOL/HDL Ratio 4.0; Triglycerides 41; VLDL 8   Estimated Creatinine Clearance: 37.1 mL/min (A) (by C-G formula based on SCr of 1.75 mg/dL (H)).   Wt Readings from Last 3 Encounters:  08/12/18 238 lb 8 oz (108.2 kg)  08/08/18 238 lb (108 kg)  07/24/18 243 lb (110.2 kg)     Other studies reviewed: Additional studies/records reviewed today include: summarized above  ASSESSMENT AND PLAN:  1. Chronic combined systolic  and diastolic CHF secondary to NICM with hypertensive heart disease: His cardiomyopathy is likely multifactorial including poorly controlled hypertensive heart disease, frequent PVCs and moderate to severe aortic stenosis.  He is well compensated and appears euvolemic on exam today.  Continue Toprol, lisinopril, and Lasix.  We will hold off on initiating spironolactone given recent AKI.  However, in follow-up this could be considered.  CHF education was provided.  Should he have worsening heart failure symptoms, in the setting of his moderate to severe aortic stenosis, he would need to be considered for right and left cardiac catheterization.  2. Nonobstructive coronary artery disease without angina: No symptoms concerning for angina.  He remains on Plavix as outlined below as well as Toprol.  We are initiating a trial of Crestor.  Continue medical therapy and primary prevention.  3. Frequent PVCs: He appears asymptomatic from these.  He was previously managed with amiodarone however this was discontinued in 07/2015 in the setting of left visual field defects.  He continues to have frequent PVCs occasionally in a pattern of ventricular bigeminy.  His EF is also slightly worse than prior on most recent echocardiogram.  In this setting, we will undergo a 24-hour Holter monitor to  quantify his PVC burden followed by referral to EP for further evaluation of his PVCs.  He remains on Toprol-XL 50 mg daily.  His heart rate precludes escalation of this at this time.  4. Aortic stenosis: Currently asymptomatic.  Continue to monitor via periodic echocardiogram.  Should he have worsening heart failure symptoms, we will need to consider right and left cardiac catheterization with possible evaluation for aortic valve replacement.  5. CKD stage II-III: Most recent serum creatinine from 07/30/2018 improved to 1.5.  6. TIA/stroke involving the left eye: This occurred in 07/2015 and he has been managed on Plavix with recommendation of statin since.  Continue Plavix per neurology recommendations.  Initiating trial of Crestor as outlined below.  7. Hypertension: Blood pressure is well controlled today.  Continue lisinopril, Toprol-XL, and Lasix.  8. Hyperlipidemia: LDL of 96 on 07/30/2018 with a goal LDL of less than 70.  He is intolerant to Lipitor secondary to possible myalgias though the patient does not definitively remember this.  He is willing to undergo a trial of Crestor 5 mg daily.  Following this, we will plan to repeat a fasting lipid and liver function in approximately 8 weeks.  Disposition: F/u with Dr. Fletcher Anon in 3 months (patient is transitioning his care from the [place] office to our [place] office)  Current medicines are reviewed at length with the patient today.  The patient did not have any concerns regarding medicines.  Signed, Christell Faith, PA-C 08/12/2018 11:33 AM     [facility] [address] [place] [address] [place], [place] [address] [phone]

## 2018-08-12 ENCOUNTER — Ambulatory Visit: Payer: Medicare Other | Admitting: Family

## 2018-08-12 ENCOUNTER — Encounter: Payer: Self-pay | Admitting: Physician Assistant

## 2018-08-12 ENCOUNTER — Ambulatory Visit (INDEPENDENT_AMBULATORY_CARE_PROVIDER_SITE_OTHER): Payer: Medicare Other

## 2018-08-12 ENCOUNTER — Ambulatory Visit (INDEPENDENT_AMBULATORY_CARE_PROVIDER_SITE_OTHER): Payer: Medicare Other | Admitting: Physician Assistant

## 2018-08-12 VITALS — BP 120/82 | HR 61 | Ht 72.0 in | Wt 238.5 lb

## 2018-08-12 DIAGNOSIS — G459 Transient cerebral ischemic attack, unspecified: Secondary | ICD-10-CM

## 2018-08-12 DIAGNOSIS — I10 Essential (primary) hypertension: Secondary | ICD-10-CM

## 2018-08-12 DIAGNOSIS — E785 Hyperlipidemia, unspecified: Secondary | ICD-10-CM

## 2018-08-12 DIAGNOSIS — I5042 Chronic combined systolic (congestive) and diastolic (congestive) heart failure: Secondary | ICD-10-CM

## 2018-08-12 DIAGNOSIS — I428 Other cardiomyopathies: Secondary | ICD-10-CM

## 2018-08-12 DIAGNOSIS — I2510 Atherosclerotic heart disease of native coronary artery without angina pectoris: Secondary | ICD-10-CM

## 2018-08-12 DIAGNOSIS — I493 Ventricular premature depolarization: Secondary | ICD-10-CM | POA: Diagnosis not present

## 2018-08-12 DIAGNOSIS — N182 Chronic kidney disease, stage 2 (mild): Secondary | ICD-10-CM

## 2018-08-12 DIAGNOSIS — I350 Nonrheumatic aortic (valve) stenosis: Secondary | ICD-10-CM

## 2018-08-12 MED ORDER — ROSUVASTATIN CALCIUM 5 MG PO TABS: 5.0000 mg | ORAL_TABLET | Freq: Every day | ORAL | 0 refills | Status: DC

## 2018-08-12 NOTE — Patient Instructions (Signed)
Medication Instructions:  START Rosuvastatin (Crestor) 5 mg tablet daily  If you need a refill on your cardiac medications before your next appointment, please call your pharmacy.   Lab work: Your provider would like for you to return in 8 weeks to have the following labs drawn: FASTING lipid and liver. Please go to the [facility] entrance and check in at the front desk. You do not need an appointment.   Testing/Procedures: Your physician has recommended that you wear a 24 holter monitor. Holter monitors are medical devices that record the heart's electrical activity. Doctors most often use these monitors to diagnose arrhythmias. Arrhythmias are problems with the speed or rhythm of the heartbeat. The monitor is a small, portable device. You can wear one while you do your normal daily activities. This is usually used to diagnose what is causing palpitations/syncope (passing out).    Follow-Up: At [facility], you and your health needs are our priority.  As part of our continuing mission to provide you with exceptional heart care, we have created designated Provider Care Teams.  These Care Teams include your primary Cardiologist (physician) and Advanced Practice Providers (APPs -  Physician Assistants and Nurse Practitioners) who all work together to provide you with the care you need, when you need it. You will need a follow up appointment in 3 months.  Please call our office 2 months in advance to schedule this appointment.  You may see Dr. Fletcher Anon or one of the following Advanced Practice Providers on your designated Care Team:   Murray Hodgkins, NP Christell Faith, PA-C   Any Other Special Instructions Will Be Listed Below (If Applicable). A referral has been placed for Dr. Caryl Comes (appointment after the monitor has been completed)

## 2018-08-20 ENCOUNTER — Ambulatory Visit
Admission: RE | Admit: 2018-08-20 | Discharge: 2018-08-20 | Disposition: A | Discharge status: Home / Self Care | Payer: Medicare Other | Source: Ambulatory Visit | Attending: Cardiovascular Disease | Admitting: Cardiovascular Disease

## 2018-08-20 DIAGNOSIS — I493 Ventricular premature depolarization: Secondary | ICD-10-CM | POA: Insufficient documentation

## 2018-08-26 ENCOUNTER — Other Ambulatory Visit: Payer: Self-pay | Admitting: *Deleted

## 2018-08-26 NOTE — Patient Outreach (Signed)
[name]) Care Management  08/26/2018  Clinton Holmes 1927-12-25 [id number]   Telephone assessment   Successful outreach call to patient , he reports that he is making .  Patient discussed that he is able to continue usual activity in the home, and drives to Monday, Friday outings for lunch and Saturday breakfasts.  He discussed  Heart Failure  Patient reports continuing to weigh daily and keep a record of weights, discussed worsening yellow zone symptoms of heart failure , patient denies increase in shortness of breath , swelling or weight gain, he identifies being in the green zone.  Patient reports today's weight as being 236.5, and 2 days prior weights 235.8, and 236, denies sudden weight gain of 3 in a day or 5 in a week over the last 3 weeks.  Patient reports that he has given up the salt shaker, food would take much better he states but he is leaving if off. Discussed benefits of limiting salt in diet in relation to heart failure .   Patient discussed recent visit to cardiology office and starting on cholesterol pill reports tolerating well, no effects. He states his daughter helps with organizing his medications.  Fall Risk  Denies recurrent falls, continues to use his walker .   Patient denies any new concerns at this time, states he has an appointment at PCP on 2/18.  Patient states that he declined need for home health services at this time.   Plan   Will plan next telephone follow up next month.  Reviewed worsening Heart failure symptoms and action plan of notifying MD office   [name] CM Care Plan Problem One     Most Recent Value  Care Plan Problem One  High risk for admission related to recent hospital admission for Heart failure .   Role Documenting the Problem One  Care Management [place] for Problem One  Active  [facility]N Long Term Goal   Patient will not experience a hospital admission over the next 90 days   THN Long Term Goal Start Date   07/28/18  Interventions for Problem One Long Term Goal  Review of current zone, and identified current zone of green. Reviewed living with heart failure measures to consider daily, knowing how you feel, taking medicaitons, weighing daily , staying active as possible .   THN CM Short Term Goal #1   Patient will attend all medical appointments over the next 30 days   THN CM Short Term Goal #1 Start Date  07/28/18  [facility] CM Short Term Goal #1 Met Date  08/26/18  THN CM Short Term Goal #2   Patient will be able to verbalize at least 2 symptoms of worsening Heart failure within the next 30 days   THN CM Short Term Goal #2 Start Date  07/28/18  Interventions for Short Term Goal #2  Teach back with Heart failure worsening symptoms and action patient is to take .   THN CM Short Term Goal #3  Over the next 30 days patient will be able to report weighing daily and keeping a record.   THN CM Short Term Goal #3 Start Date  07/28/18  THN CM Short Term Goal #3 Met Date  08/26/18  THN CM Short Term Goal #4  Patient will be able to discuss high salt foods to limit in diet over the next 30 days   THN CM Short Term Goal #4 Start Date  08/08/18  Interventions for Short Term  Goal #4  Reinforced importance of limiting salt in diet and how that helps with managing fluid with heart failure condition , discussed eating out and suggestions to limit salt seasoning and foods ot select       [name], RN, [place] Management Coordinator  [phone]- Mobile [phone]- [place]

## 2018-09-30 DIAGNOSIS — R55 Syncope and collapse: Secondary | ICD-10-CM | POA: Diagnosis not present

## 2018-09-30 DIAGNOSIS — E782 Mixed hyperlipidemia: Secondary | ICD-10-CM | POA: Diagnosis not present

## 2018-09-30 DIAGNOSIS — N183 Chronic kidney disease, stage 3 (moderate): Secondary | ICD-10-CM | POA: Diagnosis not present

## 2018-09-30 DIAGNOSIS — I10 Essential (primary) hypertension: Secondary | ICD-10-CM | POA: Diagnosis not present

## 2018-10-01 ENCOUNTER — Other Ambulatory Visit: Payer: Self-pay | Admitting: *Deleted

## 2018-10-01 NOTE — Patient Outreach (Signed)
[facility]) Care Management  10/01/2018  Clinton Holmes [birth year] [id number]  Scheduled telephone follow up call   Referral received 12/13 Referral source : [facility] Case Manager Referral reason : Patient with admission 12/12-12/14 Dx: CHF.  Insurance : Medicare   PMHx. Includes Lacunar stroke, HTN, CKD, ischemic optic neuropathy,    Unsuccessful outreach call to patient , no answer able to leave a HIPAA compliant message for return call.  Placed call to patient daughter , Clinton Holmes, [place] on file , she answers phone and reports that she is at the Dentist with patient at this time , requested return call at another time.  1600 Returned call to patient reports that he is doing alright. He discussed having visit with PCP on yesterday denies any new concerns.  Patient discussed chronic condition  Heart Failure Patient discussed continuing to monitor daily weight reports today weight is 238, and has ranged in the 235 to 239 range. he reports some swelling at lower leg nothing to speak about and no increase. Patient denies increase in shortness of breath , he reports taking medications as prescribed and his daughter continues to fill his pill Environmental education officer.  Patient reports his appetite has improved and he is eating a little more, family provides meals and does not add ,and salt shaker has been moved from the table. Patient continues to eat out 3 days and week and does not add salt . Discussed healthier choices for when eating out , no adding seasoning to food such as chicken, he states the restaurants works with him .   Patient denies having sudden weight gains of 2 pound in a day or 5 in a week, reviewed worsening symptoms for yellow zone patient identifies as being in the green zone.  Fall Risk Denies having recent fall, he states using his walker at all times. Patient continues to drive to local restaurants Monday, Friday and Saturday and tolerating without problems per reports   Appointments Patient has office visit with Dr.Klein on 3/5 his daughter Clinton Holmes keeps track of appointments and provides transportation.   Plan Will plan return call in the next month, to evaluate for ongoing complex case management needs, if none identified discussed possible case closure.  Reinforced notifying MD sooner for worsening of heart failure symptoms.   THN CM Care Plan Problem One     Most Recent Value  Care Plan Problem One  High risk for admission related to recent hospital admission for Heart failure .   Role Documenting the Problem One  Care Management [place] for Problem One  Active  THN Long Term Goal   Patient will not experience a hospital admission over the next 90 days   THN Long Term Goal Start Date  07/28/18  Interventions for Problem One Long Term Goal  Reviwed current clinical state , reinforced notifing MD of worsening HF symptoms  to avoid readmission , review of worseing symptoms , patient able to identify to call if weight gain, swelling and shorntness of breath .   THN CM Short Term Goal #1   Patient will attend all medical appointments over the next 30 days   THN CM Short Term Goal #1 Start Date  07/28/18  [facility] CM Short Term Goal #1 Met Date  08/26/18  THN CM Short Term Goal #2   Patient will be able to verbalize at least 2 symptoms of worsening Heart failure within the next 30 days   THN CM Short Term  Goal #2 Start Date  07/28/18  THN CM Short Term Goal #2 Met Date  10/01/18  THN CM Short Term Goal #3  Over the next 30 days patient will be able to report weighing daily and keeping a record.   THN CM Short Term Goal #3 Start Date  07/28/18  THN CM Short Term Goal #3 Met Date  08/26/18  THN CM Short Term Goal #4  Patient will be able to discuss high salt foods to limit in diet over the next 30 days   THN CM Short Term Goal #4 Start Date  08/08/18  [facility] CM Short Term Goal #4 Met Date  09/24/18      [name], RN, [place] Management Coordinator  [phone]- Mobile [phone]- [place]

## 2018-10-16 ENCOUNTER — Ambulatory Visit (INDEPENDENT_AMBULATORY_CARE_PROVIDER_SITE_OTHER): Payer: Medicare Other | Admitting: Internal Medicine

## 2018-10-16 ENCOUNTER — Encounter: Payer: Self-pay | Admitting: Internal Medicine

## 2018-10-16 VITALS — BP 160/80 | HR 67 | Ht 72.0 in | Wt 244.5 lb

## 2018-10-16 DIAGNOSIS — I493 Ventricular premature depolarization: Secondary | ICD-10-CM

## 2018-10-16 NOTE — Patient Instructions (Signed)
Medication Instructions:  - Your physician recommends that you continue on your current medications as directed. Please refer to the Current Medication list given to you today.  If you need a refill on your cardiac medications before your next appointment, please call your pharmacy.   Lab work: - none ordered  If you have labs (blood work) drawn today and your tests are completely normal, you will receive your results only by: Marland Kitchen MyChart Message (if you have MyChart) OR . A paper copy in the mail If you have any lab test that is abnormal or we need to change your treatment, we will call you to review the results.  Testing/Procedures: - none ordered  Follow-Up: At [facility], you and your health needs are our priority.  As part of our continuing mission to provide you with exceptional heart care, we have created designated Provider Care Teams.  These Care Teams include your primary Cardiologist (physician) and Advanced Practice Providers (APPs -  Physician Assistants and Nurse Practitioners) who all work together to provide you with the care you need, when you need it. Marland Kitchen as needed  Any Other Special Instructions Will Be Listed Below (If Applicable). - N/A

## 2018-10-16 NOTE — Progress Notes (Signed)
ELECTROPHYSIOLOGY CONSULT NOTE  Patient ID: Clinton Holmes, MRN: [id number], DOB/AGE: 1928-03-04 85 y.o. Admit date: (Not on file) Date of Consult: 10/16/2018  Primary Physician: Philmore Pali, NP Primary Cardiologist: MS     DAGOBERTO NEALY is a 85 y.o. male who is being seen today for the evaluation of PVCs at the request of MS.    HPI Clinton Holmes is a 85 y.o. male seen for PVCs with hx of syncope  He has a history of moderate aortic stenosis and frequent ventricular ectopy.  He was started on amiodarone after discussion with Dr. Elliot Cousin.  Because of visual field issue the amiodarone was discontinued.  It was thought perhaps he had had a stroke and he was treated with Plavix and atorvastatin.  He has had recurrent episodes of syncope.  These episodes have all occurred while being vertical and stationary.  He has not had episodes while he has been walking.  He has had some lightheadedness and presyncope arising from the shower.  He has longstanding hypertension.  He is aware of the impending syncope.  He is described by his daughter as being pale.  She does not recall diaphoresis.  There have been no associated palpitations; however notably, he does not have palpitations with his ventricular ectopy.  He underwent monitoring 12/19 which demonstrated a high burden of PVCs, i.e. about 35%.  His biggest complaint is lack of balance.  He denies chest pain or shortness of breath.  He has a history of an acoustic neuroma treated with a gamma knife procedure in the 90s.  Past Medical History:  Diagnosis Date  . CAD (coronary artery disease)    L&RHC 12/23/2014 elevated LVEDP, prox LAD 45%, 60% ramus  . CKD (chronic kidney disease), stage III ([place])   . Essential hypertension   . Murmur   . OA (osteoarthritis)    a. 2011 s/p bilat TKA.  . Ventricular bigeminy    a. 12/2014. Started on amio on 12/24/2014      Surgical History:  Past Surgical History:  Procedure Laterality Date  .  CARDIAC CATHETERIZATION N/A 12/23/2014   Procedure: Right/Left Heart Cath and Coronary Angiography;  Surgeon: Belva Crome, MD;  Location: [facility];  Service: Cardiovascular;  Laterality: N/A;  . KNEE SURGERY    . SHOULDER SURGERY Left   . TONSILLECTOMY       Home Meds: Current Meds  Medication Sig  . clopidogrel (PLAVIX) 75 MG tablet Take 1 tablet (75 mg total) by mouth daily.  . furosemide (LASIX) 20 MG tablet Take 1 tablet (20 mg total) by mouth daily.  Marland Kitchen lisinopril (PRINIVIL,ZESTRIL) 40 MG tablet Take 1 tablet by mouth daily.  . metoprolol succinate (TOPROL-XL) 50 MG 24 hr tablet Take 1 tablet (50 mg total) by mouth daily. Take with or immediately following a meal.  . Multiple Vitamins-Minerals (MULTIVITAMIN ADULTS 50+ PO) Take 1 tablet by mouth daily.    Allergies: No Known Allergies  Social History   Socioeconomic History  . Marital status: Single    Spouse name: Not on file  . Number of children: Not on file  . Years of education: Not on file  . Highest education level: Not on file  Occupational History  . Not on file  Social Needs  . Financial resource strain: Not on file  . Food insecurity:    Worry: Not on file    Inability: Not on file  . Transportation needs:  Medical: Not on file    Non-medical: Not on file  Tobacco Use  . Smoking status: Former Research scientist (life sciences)  . Smokeless tobacco: Never Used  . Tobacco comment: Smoked for a few yrs in the service.  Quit in 1968. Never a heavy smoker.  Substance and Sexual Activity  . Alcohol use: No    Alcohol/week: 0.0 standard drinks  . Drug use: No  . Sexual activity: Not on file  Lifestyle  . Physical activity:    Days per week: Not on file    Minutes per session: Not on file  . Stress: Not on file  Relationships  . Social connections:    Talks on phone: Not on file    Gets together: Not on file    Attends religious service: Not on file    Active member of club or organization: Not on file    Attends  meetings of clubs or organizations: Not on file    Relationship status: Not on file  . Intimate partner violence:    Fear of current or ex partner: Not on file    Emotionally abused: Not on file    Physically abused: Not on file    Forced sexual activity: Not on file  Other Topics Concern  . Not on file  Social History Narrative   Lives between [place] and [place] by himself.  He does not routinely exercise.     Family History  Problem Relation Age of Onset  . Heart attack Father        deceased @ 27.  . Congestive Heart Failure Sister        alive in her late 73's.  . Other Brother        alive @ 16.  Marland Kitchen CAD Mother        H/o CABG, deceased @ 40.     ROS:  Please see the history of present illness.     All other systems reviewed and negative.    Physical Exam: Blood pressure (!) 160/80, pulse 67, height 6' (1.829 m), weight 244 lb 8 oz (110.9 kg), SpO2 93 %. General: Well developed, well nourished male in no acute distress. Head: Normocephalic, atraumatic, sclera non-icteric, no xanthomas, nares are without discharge. EENT: normal  Lymph Nodes:  none Neck: Negative for carotid bruits. JVD not elevated. Back:without scoliosis kyphosis Lungs: Clear bilaterally to auscultation without wheezes, rales, or rhonchi. Breathing is unlabored. Heart: RRR with S1 S2.  2/6 systolic murmur . No rubs, or gallops appreciated. Abdomen: Soft, non-tender, non-distended with normoactive bowel sounds. No hepatomegaly. No rebound/guarding. No obvious abdominal masses. Msk:  Strength and tone appear normal for age. Extremities: No clubbing or cyanosis. No edema.  Distal pedal pulses are 2+ and equal bilaterally. Skin: Warm and Dry Neuro: Alert and oriented X 3. CN III-XII intact Grossly normal sensory and motor function but he was sitting in a chair Psych:  Responds to questions appropriately with a normal affect.      Labs: Cardiac Enzymes No results for input(s): CKTOTAL, CKMB,  TROPONINI in the last 72 hours. CBC Lab Results  Component Value Date   WBC 6.8 07/25/2018   HGB 12.7 (L) 07/25/2018   HCT 39.7 07/25/2018   MCV 89.2 07/25/2018   PLT 168 07/25/2018   PROTIME: No results for input(s): LABPROT, INR in the last 72 hours. Chemistry No results for input(s): NA, K, CL, CO2, BUN, CREATININE, CALCIUM, PROT, BILITOT, ALKPHOS, ALT, AST, GLUCOSE in the last 168 hours.  Invalid input(s): LABALBU Lipids Lab Results  Component Value Date   CHOL 148 07/24/2018   HDL 37 (L) 07/24/2018   LDLCALC 103 (H) 07/24/2018   TRIG 41 07/24/2018   BNP No results found for: PROBNP Thyroid Function Tests: No results for input(s): TSH, T4TOTAL, T3FREE, THYROIDAB in the last 72 hours.  Invalid input(s): FREET3 Miscellaneous Lab Results  Component Value Date   DDIMER (H) 04/16/2010    1.94        AT THE INHOUSE ESTABLISHED CUTOFF VALUE OF 0.48 ug/mL FEU, THIS ASSAY HAS BEEN DOCUMENTED IN THE LITERATURE TO HAVE A SENSITIVITY AND NEGATIVE PREDICTIVE VALUE OF AT LEAST 98 TO 99%.  THE TEST RESULT SHOULD BE CORRELATED WITH AN ASSESSMENT OF THE CLINICAL PROBABILITY OF DVT / VTE.    Radiology/Studies:  No results found.  EKG: Sinus rhythm at 67 Interval 16/13/43 Ventricular ectopy with a right bundle inferior axis morphology Frequent   Assessment and Plan:  Syncope-probably orthostatic/neurally mediated  Cardiomyopathy-mild  PVCs-frequent  Question TIA/stroke  Acoustic neuroma status post resection  Poor balance   The patient has frequent ventricular ectopy.  He has mild cardiomyopathy which might be either a consequence or the cause of his PVCs; he has no attributable symptoms either in terms of palpitations or exercise intolerance.  He is disinclined to do anything at this point.  I think that is reasonable.  His syncope seems more likely to be related to prolonged standing and have reviewed with him and his daughter the importance of trying to  clarify that as a likely mechanism.  In the event that orthostasis can be substantiated, specific therapies could be undertaken including medications, nonpharmacological compression etc.  We will see him as needed.  Virl Axe

## 2018-10-28 ENCOUNTER — Other Ambulatory Visit: Payer: Self-pay | Admitting: *Deleted

## 2018-10-28 ENCOUNTER — Other Ambulatory Visit: Payer: Self-pay

## 2018-10-28 NOTE — Patient Outreach (Signed)
[facility]) Care Management  10/28/2018  Clinton Holmes [birth year] [id number]   Telephone assessment   Referral received 12/13 Referral source : [facility] Case Manager Referral reason : Patient with admission 12/12-12/14 Dx: CHF.  Insurance : Medicare  PMHx. Includes Lacunar stroke, HTN, CKD, ischemic optic neuropathy  Successful outreach call to patient he discussed that he is doing just fine.   He further discussed :  Social  Patient continues managing daily ADL's at home, daughter assist with shower once or twice a week. Patient continues to drive short distance  to local restaurant on Monday, Friday and Saturday.  Patient family does shopping for him, provides meals.  Discussed contacting restaurants before driving there due to possible changes in services due to current situations,  patient agreed that he would and has contact numbers .   Heart failure Patient denies increase in shortness of breath , reports little swelling in lower as usual no increase. Patient reports that his weight is at 238, denies sudden increase of 3 pounds in a day or 5 in a week. Patient discussed that he weighs about twice a week , but he knows if his weight goes up, reports that when he does weigh it is early in the morning before getting dressed. He discussed that his daughter checks on him about his weights during the week.  Patient discussed that he is doing good with not adding salt to the food, and he is aware of how salt will cause body to hold on to fluid. Patient discussed attending all medical appointments and his daughter helps him with keeping up with that. Discussed with patient care connection program, for support of symptom management of chronic conditions patient feels that he is managing well for now .  Fall risk No recent falls, continues to use his walker at all times.     Advanced Directive  Again discussed with importance of having living will , he states that his  daughter know what he wants done. Patient agreeable to receiving additional information and speak with his daughter.   Successful outreach call to patient daughter Clinton Holmes, she discussed that patient is doing well at home. She reminds of weighing at home . She reports that patient continues to maintain usual activity at home, using his walker, he continues to visit restaurant 3 days a weeks driving himself.  Discussed advanced directive, daughter discussed that she believes that she has the paper work , but is agreeable to received another packet . Patient daughter denies any new concerns at this time she manages patient medications, patient taking as prescribed, family visits daily , helps with shopping and household chores.    Discussed with patient, transition to health coach telephonic program  for continued education and support of chronic conditions patient is agreeable.  Plan  Will plan closure of complex care management and transition to health coach.  Will send PCP discipline case closure letter .  Will send Advanced directive packet again, and EMMI handout .   [name], RN, [place] Management Coordinator  [phone]- Mobile [phone]- Toll Free Main Office

## 2018-10-29 ENCOUNTER — Other Ambulatory Visit: Payer: Self-pay | Admitting: *Deleted

## 2018-10-29 ENCOUNTER — Encounter: Payer: Self-pay | Admitting: *Deleted

## 2018-10-29 NOTE — Telephone Encounter (Signed)
This encounter was created in error - please disregard.

## 2018-10-30 ENCOUNTER — Emergency Department: Payer: Medicare Other

## 2018-10-30 ENCOUNTER — Emergency Department
Admission: EM | Admit: 2018-10-30 | Discharge: 2018-10-30 | Disposition: A | Discharge status: Home / Self Care | Payer: Medicare Other | Attending: Student in an Organized Health Care Education/Training Program | Admitting: Student in an Organized Health Care Education/Training Program

## 2018-10-30 ENCOUNTER — Encounter: Payer: Self-pay | Admitting: Medical Oncology

## 2018-10-30 DIAGNOSIS — Z7902 Long term (current) use of antithrombotics/antiplatelets: Secondary | ICD-10-CM | POA: Insufficient documentation

## 2018-10-30 DIAGNOSIS — Z87891 Personal history of nicotine dependence: Secondary | ICD-10-CM | POA: Insufficient documentation

## 2018-10-30 DIAGNOSIS — R05 Cough: Secondary | ICD-10-CM | POA: Insufficient documentation

## 2018-10-30 DIAGNOSIS — R059 Cough, unspecified: Secondary | ICD-10-CM

## 2018-10-30 DIAGNOSIS — I10 Essential (primary) hypertension: Secondary | ICD-10-CM | POA: Diagnosis not present

## 2018-10-30 DIAGNOSIS — I129 Hypertensive chronic kidney disease with stage 1 through stage 4 chronic kidney disease, or unspecified chronic kidney disease: Secondary | ICD-10-CM | POA: Insufficient documentation

## 2018-10-30 DIAGNOSIS — I700 Atherosclerosis of aorta: Secondary | ICD-10-CM | POA: Diagnosis not present

## 2018-10-30 DIAGNOSIS — N183 Chronic kidney disease, stage 3 (moderate): Secondary | ICD-10-CM | POA: Insufficient documentation

## 2018-10-30 DIAGNOSIS — R079 Chest pain, unspecified: Secondary | ICD-10-CM | POA: Diagnosis not present

## 2018-10-30 DIAGNOSIS — I2510 Atherosclerotic heart disease of native coronary artery without angina pectoris: Secondary | ICD-10-CM | POA: Insufficient documentation

## 2018-10-30 DIAGNOSIS — R0902 Hypoxemia: Secondary | ICD-10-CM | POA: Diagnosis not present

## 2018-10-30 DIAGNOSIS — R609 Edema, unspecified: Secondary | ICD-10-CM | POA: Diagnosis not present

## 2018-10-30 LAB — CBC WITH DIFFERENTIAL/PLATELET
ABS IMMATURE GRANULOCYTES: 0.06 10*3/uL (ref 0.00–0.07)
Basophils Absolute: 0 10*3/uL (ref 0.0–0.1)
Basophils Relative: 0 %
Eosinophils Absolute: 0.1 10*3/uL (ref 0.0–0.5)
Eosinophils Relative: 1 %
HEMATOCRIT: 41.8 % (ref 39.0–52.0)
Hemoglobin: 13.6 g/dL (ref 13.0–17.0)
Immature Granulocytes: 1 %
Lymphocytes Relative: 6 %
Lymphs Abs: 0.8 10*3/uL (ref 0.7–4.0)
MCH: 28.8 pg (ref 26.0–34.0)
MCHC: 32.5 g/dL (ref 30.0–36.0)
MCV: 88.6 fL (ref 80.0–100.0)
Monocytes Absolute: 0.7 10*3/uL (ref 0.1–1.0)
Monocytes Relative: 5 %
NEUTROS ABS: 11.4 10*3/uL — AB (ref 1.7–7.7)
NEUTROS PCT: 87 %
Platelets: 188 10*3/uL (ref 150–400)
RBC: 4.72 MIL/uL (ref 4.22–5.81)
RDW: 13.5 % (ref 11.5–15.5)
WBC: 13 10*3/uL — ABNORMAL HIGH (ref 4.0–10.5)
nRBC: 0 % (ref 0.0–0.2)

## 2018-10-30 LAB — COMPREHENSIVE METABOLIC PANEL
ALT: 21 U/L (ref 0–44)
AST: 27 U/L (ref 15–41)
Albumin: 4.2 g/dL (ref 3.5–5.0)
Alkaline Phosphatase: 56 U/L (ref 38–126)
Anion gap: 8 (ref 5–15)
BUN: 32 mg/dL — ABNORMAL HIGH (ref 8–23)
CO2: 26 mmol/L (ref 22–32)
Calcium: 9.6 mg/dL (ref 8.9–10.3)
Chloride: 106 mmol/L (ref 98–111)
Creatinine, Ser: 1.47 mg/dL — ABNORMAL HIGH (ref 0.61–1.24)
GFR calc Af Amer: 48 mL/min — ABNORMAL LOW (ref >60)
GFR calc non Af Amer: 42 mL/min — ABNORMAL LOW (ref >60)
Glucose, Bld: 117 mg/dL — ABNORMAL HIGH (ref 70–99)
Potassium: 4.3 mmol/L (ref 3.5–5.1)
Sodium: 140 mmol/L (ref 135–145)
TOTAL PROTEIN: 6.8 g/dL (ref 6.5–8.1)
Total Bilirubin: 0.6 mg/dL (ref 0.3–1.2)

## 2018-10-30 LAB — TROPONIN I
Troponin I: <0.03 ng/mL (ref <0.03)
Troponin I: <0.03 ng/mL (ref <0.03)
Troponin I: <0.03 ng/mL (ref <0.03)

## 2018-10-30 MED ORDER — AMLODIPINE BESYLATE 5 MG PO TABS
5.0000 mg | ORAL_TABLET | Freq: Once | ORAL | Status: AC
  Administered 2018-10-30: 5 mg via ORAL
  Filled 2018-10-30: qty 1

## 2018-10-30 MED ORDER — AZITHROMYCIN 500 MG PO TABS: 500.0000 mg | ORAL_TABLET | Freq: Every day | ORAL | 0 refills | Status: AC

## 2018-10-30 MED ORDER — IOHEXOL 350 MG/ML SOLN
75.0000 mL | Freq: Once | INTRAVENOUS | Status: AC | PRN
  Administered 2018-10-30: 75 mL via INTRAVENOUS

## 2018-10-30 NOTE — ED Triage Notes (Signed)
Pt from home via ems with reports of chest pain that is central and started around 1400 today. Pt denies sob. States that he was given 1 spray of NTG with EMS and pain decreased from 8 to 6. Pt A/O x 4. NAD noted.

## 2018-10-30 NOTE — ED Provider Notes (Signed)
[facility] Emergency Department Provider Note    First MD Initiated Contact with Patient 10/30/18 1726     (approximate)  I have reviewed the triage vital signs and the nursing notes.   HISTORY  Chief Complaint Chest Pain    HPI Clinton Holmes is a 85 y.o. male the below listed past medical history presents the ER with chest pain that started earlier this morning lasted throughout the day.  States he was sitting on his recliner.  Denied any shortness of breath.  States he does have a history of CHF as well as CAD.  Has had similar pain in the past.   He is currently pain-free.  Did get some nitro in route.  Denies any recent fevers.  No cough.  No nausea or vomiting.  Echo 12/19: Study Conclusions  - Left ventricle: The cavity size was normal. There was mild   concentric hypertrophy. Systolic function was mildly reduced. The   estimated ejection fraction was in the range of 45% to 50%.   Images were inadequate for LV wall motion assessment. Doppler   parameters are consistent with abnormal left ventricular   relaxation (grade 1 diastolic dysfunction). - Aortic valve: There was moderate to severe stenosis. Mean   gradient (S): 26 mm Hg. Valve area (VTI): 0.76 cm^2. Peak   velocity ratio of LVOT to aortic valve: 0.27. - Left atrium: The atrium was mildly dilated.   Past Medical History:  Diagnosis Date  . CAD (coronary artery disease)    L&RHC 12/23/2014 elevated LVEDP, prox LAD 45%, 60% ramus  . CKD (chronic kidney disease), stage III ([place])   . Essential hypertension   . Murmur   . OA (osteoarthritis)    a. 2011 s/p bilat TKA.  . Ventricular bigeminy    a. 12/2014. Started on amio on 12/24/2014   Family History  Problem Relation Age of Onset  . Heart attack Father        deceased @ 66.  . Congestive Heart Failure Sister        alive in her late 86's.  . Other Brother        alive @ 15.  Marland Kitchen CAD Mother        H/o CABG, deceased @ 26.    Past Surgical History:  Procedure Laterality Date  . CARDIAC CATHETERIZATION N/A 12/23/2014   Procedure: Right/Left Heart Cath and Coronary Angiography;  Surgeon: Belva Crome, MD;  Location: [facility];  Service: Cardiovascular;  Laterality: N/A;  . KNEE SURGERY    . SHOULDER SURGERY Left   . TONSILLECTOMY     Patient Active Problem List   Diagnosis Date Noted  . CHF (congestive heart failure) ([place]) 07/24/2018  . Aortic stenosis 12/23/2014  . Ventricular bigeminy 12/21/2014  . Essential hypertension   . CKD (chronic kidney disease), stage III ([place])   . Hypertension   . OA (osteoarthritis)       Prior to Admission medications   Medication Sig Start Date End Date Taking? Authorizing Provider  clopidogrel (PLAVIX) 75 MG tablet Take 1 tablet (75 mg total) by mouth daily. 11/25/17  Yes Jerline Pain, MD  furosemide (LASIX) 20 MG tablet Take 1 tablet (20 mg total) by mouth daily. 07/26/18 07/26/19 Yes Dustin Flock, MD  lisinopril (PRINIVIL,ZESTRIL) 40 MG tablet Take 1 tablet by mouth daily.   Yes [provider]  metoprolol succinate (TOPROL-XL) 50 MG 24 hr tablet Take 1 tablet (50 mg total)  by mouth daily. Take with or immediately following a meal. 07/26/18  Yes Dustin Flock, MD  Multiple Vitamins-Minerals (MULTIVITAMIN ADULTS 50+ PO) Take 1 tablet by mouth daily.   Yes [provider]  azithromycin (ZITHROMAX) 500 MG tablet Take 1 tablet (500 mg total) by mouth daily for 3 days. Take 1 tablet daily for 3 days. 10/30/18 11/02/18  Merlyn Lot, MD    Allergies Patient has no known allergies.    Social History Social History   Tobacco Use  . Smoking status: Former Research scientist (life sciences)  . Smokeless tobacco: Never Used  . Tobacco comment: Smoked for a few yrs in the service.  Quit in 1968. Never a heavy smoker.  Substance Use Topics  . Alcohol use: No    Alcohol/week: 0.0 standard drinks  . Drug use: No    Review of Systems Patient denies headaches,  rhinorrhea, blurry vision, numbness, shortness of breath, chest pain, edema, cough, abdominal pain, nausea, vomiting, diarrhea, dysuria, fevers, rashes or hallucinations unless otherwise stated above in HPI. ____________________________________________   PHYSICAL EXAM:  VITAL SIGNS: Vitals:   10/30/18 1931 10/30/18 2130  BP: (!) 157/108 (!) 133/91  Pulse:  66  Resp:  17  Temp:    SpO2:  93%    Constitutional: Alert and oriented.  Eyes: Conjunctivae are normal.  Head: Atraumatic. Nose: No congestion/rhinnorhea. Mouth/Throat: Mucous membranes are moist.   Neck: No stridor. Painless ROM.  Cardiovascular: Normal rate, irregular rhythm. Grossly normal heart sounds.  Good peripheral circulation. Respiratory: Normal respiratory effort.  No retractions. Lungs CTAB. Gastrointestinal: Soft and nontender. No distention. No abdominal bruits. No CVA tenderness. Genitourinary:  Musculoskeletal: No lower extremity tenderness nor edema.  No joint effusions. Neurologic:  Normal speech and language. No gross focal neurologic deficits are appreciated. No facial droop Skin:  Skin is warm, dry and intact. No rash noted. Psychiatric: Mood and affect are normal. Speech and behavior are normal.  ____________________________________________   LABS (all labs ordered are listed, but only abnormal results are displayed)  Results for orders placed or performed during the hospital encounter of 10/30/18 (from the past 24 hour(s))  CBC with Differential/Platelet     Status: Abnormal   Collection Time: 10/30/18  6:06 PM  Result Value Ref Range   WBC 13.0 (H) 4.0 - 10.5 K/uL   RBC 4.72 4.22 - 5.81 MIL/uL   Hemoglobin 13.6 13.0 - 17.0 g/dL   HCT 41.8 39.0 - 52.0 %   MCV 88.6 80.0 - 100.0 fL   MCH 28.8 26.0 - 34.0 pg   MCHC 32.5 30.0 - 36.0 g/dL   RDW 13.5 11.5 - 15.5 %   Platelets 188 150 - 400 K/uL   nRBC 0.0 0.0 - 0.2 %   Neutrophils Relative % 87 %   Neutro Abs 11.4 (H) 1.7 - 7.7 K/uL    Lymphocytes Relative 6 %   Lymphs Abs 0.8 0.7 - 4.0 K/uL   Monocytes Relative 5 %   Monocytes Absolute 0.7 0.1 - 1.0 K/uL   Eosinophils Relative 1 %   Eosinophils Absolute 0.1 0.0 - 0.5 K/uL   Basophils Relative 0 %   Basophils Absolute 0.0 0.0 - 0.1 K/uL   Immature Granulocytes 1 %   Abs Immature Granulocytes 0.06 0.00 - 0.07 K/uL  Comprehensive metabolic panel     Status: Abnormal   Collection Time: 10/30/18  6:06 PM  Result Value Ref Range   Sodium 140 135 - 145 mmol/L   Potassium 4.3 3.5 - 5.1 mmol/L  Chloride 106 98 - 111 mmol/L   CO2 26 22 - 32 mmol/L   Glucose, Bld 117 (H) 70 - 99 mg/dL   BUN 32 (H) 8 - 23 mg/dL   Creatinine, Ser 1.47 (H) 0.61 - 1.24 mg/dL   Calcium 9.6 8.9 - 10.3 mg/dL   Total Protein 6.8 6.5 - 8.1 g/dL   Albumin 4.2 3.5 - 5.0 g/dL   AST 27 15 - 41 U/L   ALT 21 0 - 44 U/L   Alkaline Phosphatase 56 38 - 126 U/L   Total Bilirubin 0.6 0.3 - 1.2 mg/dL   GFR calc non Af Amer 42 (L) >60 mL/min   GFR calc Af Amer 48 (L) >60 mL/min   Anion gap 8 5 - 15  Troponin I - ONCE - STAT     Status: None   Collection Time: 10/30/18  6:06 PM  Result Value Ref Range   Troponin I <0.03 <0.03 ng/mL  Troponin I - Once-Timed     Status: None   Collection Time: 10/30/18  7:57 PM  Result Value Ref Range   Troponin I <0.03 <0.03 ng/mL  Troponin I - Once-Timed     Status: None   Collection Time: 10/30/18  9:01 PM  Result Value Ref Range   Troponin I <0.03 <0.03 ng/mL   ____________________________________________  EKG My review and personal interpretation at Time: 17:30   Indication: chest pain  Rate: 85  Rhythm: sinus with frequent ventricular ectopy Axis: left Other: inferolateral t wave inversions, no stemi criteria, occasional pvc ____________________________________________  RADIOLOGY  I personally reviewed all radiographic images ordered to evaluate for the above acute complaints and reviewed radiology reports and findings.  These findings were personally  discussed with the patient.  Please see medical record for radiology report.  ____________________________________________   PROCEDURES  Procedure(s) performed:  Procedures    Critical Care performed: no ____________________________________________   INITIAL IMPRESSION / ASSESSMENT AND PLAN / ED COURSE  Pertinent labs & imaging results that were available during my care of the patient were reviewed by me and considered in my medical decision making (see chart for details).   DDX: ACS, pericarditis, esophagitis, boerhaaves, pe, dissection, pna, bronchitis, costochondritis    FAARIS ARIZPE is a 85 y.o. who presents to the ED with symptoms as described above.  Patient well-appearing and is currently pain-free.  Mildly hypertensive but not in any respiratory distress.  EKG is abnormal but does have history of frequent ventricular ectopy which may be contributing to the EKG changes from previous.  Not a STEMI.  Initial blood work is reassuring.  No evidence of elevated troponin.  Does not seem consistent with ACS but given his history and risk factors will obviously observe in the ER for serial enzymes.  His abdominal exam is soft and benign.  Clinical Course as of Oct 30 2151  Thu Oct 30, 2018  1909 Patient states he not having any pain right now but blood pressure is markedly elevated.  Given his risk factors will order CTA to exclude dissection.   [PR]  2104 Repeat EKG now with less ventricular ectopy is consistent with previous no evidence of acute ischemia.  Currently waiting on 3-hour troponin.   [PR]  2152 Repeat troponin negative.  Blood pressure stable.  Patient feels well and requesting discharge home.  Given his frequent cough and comorbidities as it is productive will cover with antibiotic for community-acquired pneumonia as he does have mild leukocytosis as well.   [  PR]    Clinical Course User Index [PR] Merlyn Lot, MD     As part of my medical decision  making, I reviewed the following data within the [place] [facility] notes reviewed and incorporated, Labs reviewed, notes from prior ED visits.   ____________________________________________   FINAL CLINICAL IMPRESSION(S) / ED DIAGNOSES  Final diagnoses:  Chest pain, unspecified type  Cough      NEW MEDICATIONS STARTED DURING THIS VISIT:  New Prescriptions   AZITHROMYCIN (ZITHROMAX) 500 MG TABLET    Take 1 tablet (500 mg total) by mouth daily for 3 days. Take 1 tablet daily for 3 days.     Note:  This document was prepared using Dragon voice recognition software and may include unintentional dictation errors.    Merlyn Lot, MD 10/30/18 2154

## 2018-10-30 NOTE — ED Notes (Signed)
Family at bedside. 

## 2018-11-06 ENCOUNTER — Other Ambulatory Visit: Payer: Self-pay

## 2018-11-06 ENCOUNTER — Telehealth: Payer: Self-pay | Admitting: Cardiovascular Disease

## 2018-11-06 MED ORDER — METOPROLOL SUCCINATE ER 50 MG PO TB24: 50.0000 mg | ORAL_TABLET | Freq: Every day | ORAL | 3 refills | Status: DC

## 2018-11-06 NOTE — Telephone Encounter (Signed)
*  STAT* If patient is at the pharmacy, call can be transferred to refill team.   1. Which medications need to be refilled? (please list name of each medication and dose if known) Metoprolol  2. Which pharmacy/location (including street and city if local pharmacy) is medication to be sent to? [facility]  3. Do they need a 30 day or 90 day supply? [place]

## 2018-11-06 NOTE — Telephone Encounter (Signed)
TELEPHONE CALL NOTE  Clinton Holmes has been deemed a candidate for a follow-up tele-health visit to limit community exposure during the Covid-19 pandemic. I spoke with the patient via phone to ensure availability of phone/video source, confirm preferred email & phone number, and discuss instructions and expectations.  I reminded Clinton Holmes to be prepared with any vital sign and/or heart rhythm information that could potentially be obtained via home monitoring, at the time of his visit. I reminded Clinton Holmes to expect an e-mail containing a link for their video-based visit approximately 15 minutes before his visit, or alternatively, a phone call at the time of his visit if his visit is planned to be a phone encounter.  STAFF MUST READ CONSENT VERBATIM TO PATIENT BELOW - Did the patient verbally consent to treatment as below? yes  Clinton Holmes 11/06/2018 3:05 PM  DOWNLOADING THE SOFTWARE (If applicable)  Download the [employer] app to enable video and telephone visits with your [facility] Provider.   Instructions for downloading [employer] WebEx: - Go to https://www.webex.com/downloads.html and follow the instructions - If you have technical difficulties with downloading WebEx, please call WebEx at [phone]. - Once the app is downloaded (can be done on either mobile or desktop computer), go to Settings in the upper left hand corner.  Be sure that camera and audio are enabled.  - You will receive an email message with a link to the meeting with a time to join for your tele-health visit.  - Please download the app and have settings configured prior to the appointment time.    CONSENT FOR TELE-HEALTH VISIT - PLEASE REVIEW  I hereby voluntarily request, consent and authorize [place] and its employed or contracted physicians, physician assistants, nurse practitioners or other licensed health care professionals (the Practitioner), to provide me with telemedicine  health care services (the Services") as deemed necessary by the treating Practitioner. I acknowledge and consent to receive the Services by the Practitioner via telemedicine. I understand that the telemedicine visit will involve communicating with the Practitioner through live audiovisual communication technology and the disclosure of certain medical information by electronic transmission. I acknowledge that I have been given the opportunity to request an in-person assessment or other available alternative prior to the telemedicine visit and am voluntarily participating in the telemedicine visit.  I understand that I have the right to withhold or withdraw my consent to the use of telemedicine in the course of my care at any time, without affecting my right to future care or treatment, and that the Practitioner or I may terminate the telemedicine visit at any time. I understand that I have the right to inspect all information obtained and/or recorded in the course of the telemedicine visit and may receive copies of available information for a reasonable fee.  I understand that some of the potential risks of receiving the Services via telemedicine include:   Delay or interruption in medical evaluation due to technological equipment failure or disruption;  Information transmitted may not be sufficient (e.g. poor resolution of images) to allow for appropriate medical decision making by the Practitioner; and/or   In rare instances, security protocols could fail, causing a breach of personal health information.  Furthermore, I acknowledge that it is my responsibility to provide information about my medical history, conditions and care that is complete and accurate to the best of my ability. I acknowledge that Practitioner's advice, recommendations, and/or decision may be based on factors not within their  control, such as incomplete or inaccurate data provided by me or distortions of diagnostic images or  specimens that may result from electronic transmissions. I understand that the practice of medicine is not an exact science and that Practitioner makes no warranties or guarantees regarding treatment outcomes. I acknowledge that I will receive a copy of this consent concurrently upon execution via email to the email address I last provided but may also request a printed copy by calling the office of [place].    I understand that my insurance will be billed for this visit.   I have read or had this consent read to me.  I understand the contents of this consent, which adequately explains the benefits and risks of the Services being provided via telemedicine.   I have been provided ample opportunity to ask questions regarding this consent and the Services and have had my questions answered to my satisfaction.  I give my informed consent for the services to be provided through the use of telemedicine in my medical care  By participating in this telemedicine visit I agree to the above.

## 2018-11-07 ENCOUNTER — Telehealth (INDEPENDENT_AMBULATORY_CARE_PROVIDER_SITE_OTHER): Payer: Medicare Other | Admitting: Cardiovascular Disease

## 2018-11-07 ENCOUNTER — Other Ambulatory Visit: Payer: Self-pay

## 2018-11-07 ENCOUNTER — Telehealth: Payer: Self-pay | Admitting: Cardiovascular Disease

## 2018-11-07 DIAGNOSIS — R0789 Other chest pain: Secondary | ICD-10-CM | POA: Diagnosis not present

## 2018-11-07 DIAGNOSIS — I359 Nonrheumatic aortic valve disorder, unspecified: Secondary | ICD-10-CM

## 2018-11-07 DIAGNOSIS — I350 Nonrheumatic aortic (valve) stenosis: Secondary | ICD-10-CM

## 2018-11-07 NOTE — Progress Notes (Signed)
Virtual Visit via Telephone Note    Evaluation Performed:  Follow-up visit  This visit type was conducted due to national recommendations for restrictions regarding the COVID-19 Pandemic (e.g. social distancing).  This format is felt to be most appropriate for this patient at this time.  All issues noted in this document were discussed and addressed.  No physical exam was performed (except for noted visual exam findings with Video Visits).  Please refer to the patient's chart (MyChart message for video visits and phone note for telephone visits) for the patient's consent to telehealth for [facility].  Date:  11/07/2018   ID:  Clinton Holmes, DOB [birth year], MRN [id number]  Patient Location:    Provider location:   [place]  PCP:  Philmore Pali, NP  Cardiologist:  Candee Furbish, MD  Electrophysiologist:  None   Chief Complaint: Follow-up visit.  History of Present Illness:    Clinton Holmes is a 85 y.o. male who presents via audio/video conferencing for a telehealth visit today.  He has known history of mild nonobstructive coronary artery disease on previous catheterization in 1856, chronic systolic heart failure with mildly reduced LV systolic function, moderate to severe aortic stenosis, previous syncope, stage III chronic kidney disease, frequent PVCs and multiple other multiple comorbidities.  The patient is transferring his cardiac care to the [facility] office and was seen by Christell Faith in December. He saw Dr. Caryl Comes for frequent PVCs and given lack of symptoms, no new treatment was started. He was started on small dose rosuvastatin in December but he reports that he only took it for few days and stopped due to making his mouth dry.  He does not want to try another medication. He went to the emergency room last week with atypical chest pain.  His enzymes were negative.  He has not had any further chest pain.  The patient does not symptoms concerning for COVID-19  infection (fever, chills, cough, or new shortness of breath).    Prior CV studies:   The following studies were reviewed today:    Past Medical History:  Diagnosis Date  . CAD (coronary artery disease)    L&RHC 12/23/2014 elevated LVEDP, prox LAD 45%, 60% ramus  . CKD (chronic kidney disease), stage III ([place])   . Essential hypertension   . Murmur   . OA (osteoarthritis)    a. 2011 s/p bilat TKA.  . Ventricular bigeminy    a. 12/2014. Started on amio on 12/24/2014   Past Surgical History:  Procedure Laterality Date  . CARDIAC CATHETERIZATION N/A 12/23/2014   Procedure: Right/Left Heart Cath and Coronary Angiography;  Surgeon: Belva Crome, MD;  Location: [facility];  Service: Cardiovascular;  Laterality: N/A;  . KNEE SURGERY    . SHOULDER SURGERY Left   . TONSILLECTOMY       No outpatient medications have been marked as taking for the 11/07/18 encounter (Appointment) with Wellington Hampshire, MD.     Allergies:   Patient has no known allergies.   Social History   Tobacco Use  . Smoking status: Former Research scientist (life sciences)  . Smokeless tobacco: Never Used  . Tobacco comment: Smoked for a few yrs in the service.  Quit in 1968. Never a heavy smoker.  Substance Use Topics  . Alcohol use: No    Alcohol/week: 0.0 standard drinks  . Drug use: No     Family Hx: The patient's family history includes CAD in his mother; Congestive Heart  Failure in his sister; Heart attack in his father; Other in his brother.  ROS:   Please see the history of present illness.     All other systems reviewed and are negative.   Labs/Other Tests and Data Reviewed:    Recent Labs: 07/24/2018: B Natriuretic Peptide 1,345.0; Magnesium 2.0; TSH 1.368 10/30/2018: ALT 21; BUN 32; Creatinine, Ser 1.47; Hemoglobin 13.6; Platelets 188; Potassium 4.3; Sodium 140   Recent Lipid Panel Lab Results  Component Value Date/Time   CHOL 148 07/24/2018 03:31 PM   TRIG 41 07/24/2018 03:31 PM   HDL 37 (L) 07/24/2018  03:31 PM   CHOLHDL 4.0 07/24/2018 03:31 PM   LDLCALC 103 (H) 07/24/2018 03:31 PM    Wt Readings from Last 3 Encounters:  10/30/18 242 lb 8.1 oz (110 kg)  10/16/18 244 lb 8 oz (110.9 kg)  08/12/18 238 lb 8 oz (108.2 kg)     Exam:    Vital Signs:  There were no vitals taken for this visit.    ASSESSMENT & PLAN:    1.  Recent atypical chest pain: No further symptoms.  He has no convincing symptoms of angina.  His troponin was negative in the ED.  I do not recommend further ischemic work-up at the present time.  2.  Chronic systolic heart failure: Continue Toprol, lisinopril and furosemide.  He reports stable weight at home with no significant change.  3.  Hyperlipidemia: He did not tolerate small dose rosuvastatin and does not want to try other medications.  4.  Frequent PVCs: Asymptomatic.  He continues to be on Toprol.  5.  Moderate to severe aortic stenosis: Consider repeat echocardiogram in 3 to 6 months.  COVID-19 Education: The signs and symptoms of COVID-19 were discussed with the patient and how to seek care for testing (follow up with PCP or arrange E-visit).  The importance of social distancing was discussed today.  Patient Risk:   After full review of this patients clinical status, I feel that they are at least moderate risk at this time.  Time:   Today, I have spent 20 minutes with the patient with telehealth technology discussing .     Medication Adjustments/Labs and Tests Ordered: Current medicines are reviewed at length with the patient today.  Concerns regarding medicines are outlined above.  Tests Ordered: No orders of the defined types were placed in this encounter.  Medication Changes: No orders of the defined types were placed in this encounter.   Disposition:  Follow up in 3 month(s)  Signed, Kathlyn Sacramento, MD  11/07/2018 5:19 PM    [place]

## 2018-11-07 NOTE — Patient Instructions (Signed)
Medication Instructions:  Continue same medications If you need a refill on your cardiac medications before your next appointment, please call your pharmacy.   Lab work: None If you have labs (blood work) drawn today and your tests are completely normal, you will receive your results only by: Marland Kitchen MyChart Message (if you have MyChart) OR . A paper copy in the mail If you have any lab test that is abnormal or we need to change your treatment, we will call you to review the results.  Testing/Procedures: None  Follow-Up: At [facility], you and your health needs are our priority.  As part of our continuing mission to provide you with exceptional heart care, we have created designated Provider Care Teams.  These Care Teams include your primary Cardiologist (physician) and Advanced Practice Providers (APPs -  Physician Assistants and Nurse Practitioners) who all work together to provide you with the care you need, when you need it. You will need a follow up appointment in 3 months.  Please call our office 2 months in advance to schedule this appointment.  You may see Dr. Fletcher Anon or one of the following Advanced Practice Providers on your designated Care Team:   Murray Hodgkins, NP Christell Faith, PA-C . Marrianne Mood, PA-C  Any Other Special Instructions Will Be Listed Below (If Applicable).

## 2018-11-07 NOTE — Telephone Encounter (Signed)
AVS not complete please fu for checkout

## 2018-11-10 NOTE — Telephone Encounter (Signed)
Needs 3 m fu .  Call when schedule available.

## 2018-11-24 ENCOUNTER — Other Ambulatory Visit: Payer: Self-pay

## 2018-11-24 ENCOUNTER — Other Ambulatory Visit: Payer: Self-pay | Admitting: *Deleted

## 2019-01-10 DIAGNOSIS — L814 Other melanin hyperpigmentation: Secondary | ICD-10-CM | POA: Diagnosis not present

## 2019-01-10 DIAGNOSIS — D1801 Hemangioma of skin and subcutaneous tissue: Secondary | ICD-10-CM | POA: Diagnosis not present

## 2019-01-10 DIAGNOSIS — L820 Inflamed seborrheic keratosis: Secondary | ICD-10-CM | POA: Diagnosis not present

## 2019-01-26 ENCOUNTER — Other Ambulatory Visit: Payer: Self-pay | Admitting: *Deleted

## 2019-01-26 NOTE — Patient Outreach (Signed)
[place] [facility]) Care Management  01/26/2019   Clinton Holmes June 22, 1928 [id number]  [place] telephone call to patient.  Hipaa compliance verified. Per patient he is doing good. Per patient his weight was 240 yesterdayl He has not weighed to day. Per patient he has not had any recent falls.  Patient stated that he has a small amount of swelling in ankles. Per patient he is staying in and not driving or going anywhere right not. Patient is trying to eat healthy. Per patient his daughter comes and assists. Patient has agreed to follow up outreach calls.   Current Medications:  Current Outpatient Medications  Medication Sig Dispense Refill  . clopidogrel (PLAVIX) 75 MG tablet Take 1 tablet (75 mg total) by mouth daily. 90 tablet 3  . furosemide (LASIX) 20 MG tablet Take 1 tablet (20 mg total) by mouth daily. 30 tablet 11  . lisinopril (PRINIVIL,ZESTRIL) 40 MG tablet Take 1 tablet by mouth daily.    . metoprolol succinate (TOPROL-XL) 50 MG 24 hr tablet Take 1 tablet (50 mg total) by mouth daily. Take with or immediately following a meal. 90 tablet 3  . Multiple Vitamins-Minerals (MULTIVITAMIN ADULTS 50+ PO) Take 1 tablet by mouth daily.     No current facility-administered medications for this visit.     Functional Status:  In your present state of health, do you have any difficulty performing the following activities: 11/24/2018 07/28/2018  Hearing? 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 N N  Walking or climbing stairs? Y Y  Comment ambulates with walker -  Dressing or bathing? Y Y  Doing errands, shopping? Y Y  Comment patient does drive some but hasn't in over 3 weeks/ dtr runs errands -  Preparing Food and eating ? [name]  Comment family assist famiy assist  Using the Toilet? - N  In the past six months, have you accidently leaked urine? Y Y  Do you have problems  with loss of bowel control? N N  Managing your Medications? [name]  Comment daughter fixes -  Managing your Finances? N N  Housekeeping or managing your Housekeeping? [name]  Comment family assists family assist  Some recent data might be hidden    Fall/Depression Screening: Fall Risk  01/26/2019 11/24/2018 08/08/2018  Falls in the past year? 0 0 -  Number falls in past yr: - - -  Injury with Fall? - - -  Risk for fall due to : Impaired balance/gait;Impaired mobility - Impaired balance/gait;History of fall(s)  Follow up Falls evaluation completed;Falls prevention discussed - Falls prevention discussed;Education provided   [facility] 2/9 Scores 01/26/2019 11/24/2018 07/28/2018  PHQ - 2 Score 0 0 0   THN CM Care Plan Problem One     Most Recent Value  Care Plan Problem One  Knowledge Deficit in Self Management of Congestive Heart Failure  Role Documenting the Problem One  [place] for Problem One  Active  THN Long Term Goal   Patient will not have any admissions due to CHF exacerbation  Interventions for Problem One Long Term Goal  RN reiterates daily weights , monitoring swelling. medication adherence and low sodium diet. RN follow up for further discussion  THN CM Short Term Goal #1   Patient will verbalize weighing daily and keeping a record within the next 30 days  Interventions for Short Term Goal #1  RN reiterates daily weights, zones and action plan. RN will follow up with further discussion  THN CM Short Term Goal #2   Patient will verbalize understanding and following coronavirus safety precautions within the next 30 days  Interventions for Short Term Goal #2  RN reiterates coronavirus safety precaustion to continue to follow. RN discussed that even though phases have been implemented that he needs to wear his  masks and use hand cleaning when going out. RN follow [place] for further discussion      Assessment:  Patient weigh yesterday was 240 Patient has not weighed  today Patient has not been driving or going out since virus   Plan:  RN reiterated the CHF zones and action plan RN reiterated coronavirus safety precautions RN reiterated monitoring weight RN reiterated monitoring swelling RN will follow up within the month of September  Jomayra Novitsky [place] Management [phone]

## 2019-01-26 NOTE — Patient Outreach (Signed)
[place] [facility]) Care Management  11/24/2018 Late entry  [place] WENZLICK [birth year] [id number]  [place] telephone call to patient.  Hipaa compliance verified. Per patient he is weighing daily. Weight today is 235. Per patient his weight has been running 235-238 pounds. Patient stated that he has a little swelling. Per patient his appetite is good. He is taking medications as directed. He is drinking mostly water. Patient has not had any recent falls. Patient uses a walker in and house and goes out. Patient is still driving but has not driven in 3 weeks. Patient has agreed to follow up outreach calls.   Current Medications:  Current Outpatient Medications  Medication Sig Dispense Refill  . clopidogrel (PLAVIX) 75 MG tablet Take 1 tablet (75 mg total) by mouth daily. 90 tablet 3  . furosemide (LASIX) 20 MG tablet Take 1 tablet (20 mg total) by mouth daily. 30 tablet 11  . lisinopril (PRINIVIL,ZESTRIL) 40 MG tablet Take 1 tablet by mouth daily.    . metoprolol succinate (TOPROL-XL) 50 MG 24 hr tablet Take 1 tablet (50 mg total) by mouth daily. Take with or immediately following a meal. 90 tablet 3  . Multiple Vitamins-Minerals (MULTIVITAMIN ADULTS 50+ PO) Take 1 tablet by mouth daily.     No current facility-administered medications for this visit.     Functional Status:  In your present state of health, do you have any difficulty performing the following activities: 11/24/2018 07/28/2018  Hearing? 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 N N  Walking or climbing stairs? Y Y  Comment ambulates with walker -  Dressing or bathing? Y Y  Doing errands, shopping? Y Y  Comment patient does drive some but hasn't in over 3 weeks/ dtr runs errands -  Preparing Food and eating ? [name]  Comment family assist famiy assist  Using the Toilet? - N  In the past six months, have you  accidently leaked urine? Y Y  Do you have problems with loss of bowel control? N N  Managing your Medications? [name]  Comment daughter fixes -  Managing your Finances? N N  Housekeeping or managing your Housekeeping? [name]  Comment family assists family assist  Some recent data might be hidden    Fall/Depression Screening: Fall Risk  11/24/2018 08/08/2018 07/28/2018  Falls in the past year? 0 - 1  Number falls in past yr: - - 1  Injury with Fall? - - 1  Risk for fall due to : - Impaired balance/gait;History of fall(s) -  Follow up - Falls prevention discussed;Education provided -   PHQ 2/9 Scores 11/24/2018 07/28/2018  PHQ - 2 Score 0 0    Assessment:  Weight 235 Weighs daily Monitors for swelling Understands CHF zones and action plan Appetite good   Plan:  Patient will continue to weigh daily RN reiterated CHF zones and Action plan RN will follow up within the month of June Barriers letter and assessment  sent to PCP  [place] [facility] [phone]

## 2019-04-06 DIAGNOSIS — Z9181 History of falling: Secondary | ICD-10-CM | POA: Diagnosis not present

## 2019-04-06 DIAGNOSIS — E785 Hyperlipidemia, unspecified: Secondary | ICD-10-CM | POA: Diagnosis not present

## 2019-04-06 DIAGNOSIS — Z1331 Encounter for screening for depression: Secondary | ICD-10-CM | POA: Diagnosis not present

## 2019-04-06 DIAGNOSIS — Z0000 Encounter for general adult medical examination without abnormal findings: Secondary | ICD-10-CM | POA: Diagnosis not present

## 2019-04-06 DIAGNOSIS — Z6833 Body mass index (BMI) 33.0-33.9, adult: Secondary | ICD-10-CM | POA: Diagnosis not present

## 2019-04-13 ENCOUNTER — Encounter: Payer: Self-pay | Admitting: Cardiovascular Disease

## 2019-04-13 DIAGNOSIS — I10 Essential (primary) hypertension: Secondary | ICD-10-CM | POA: Diagnosis not present

## 2019-04-13 DIAGNOSIS — E782 Mixed hyperlipidemia: Secondary | ICD-10-CM | POA: Diagnosis not present

## 2019-04-13 DIAGNOSIS — I350 Nonrheumatic aortic (valve) stenosis: Secondary | ICD-10-CM | POA: Diagnosis not present

## 2019-04-13 DIAGNOSIS — N183 Chronic kidney disease, stage 3 (moderate): Secondary | ICD-10-CM | POA: Diagnosis not present

## 2019-04-17 ENCOUNTER — Telehealth: Payer: Self-pay

## 2019-04-17 NOTE — Telephone Encounter (Signed)
-----   Message from Wellington Hampshire, MD sent at 04/15/2019  1:15 PM EDT ----- Stable labs.

## 2019-04-17 NOTE — Telephone Encounter (Signed)
Spoke with the patient. Adv him that his scanned lab results have been reviewed by Dr. Fletcher Anon and are stable. Patient voiced appreciation for the call.

## 2019-04-28 ENCOUNTER — Other Ambulatory Visit: Payer: Self-pay | Admitting: *Deleted

## 2019-04-29 NOTE — Patient Outreach (Signed)
[facility]) Care Management  04/28/2019  Clinton Holmes November 17, 1927 [id number]  RN Health Coach telephone call to patient.  Hipaa compliance verified. Per patient he is doing good. No swelling in lower extremities or additional shortness of breath. . Patient is doing daily weights. Per patient he has been taking his medication as prescribed. Patient stated he is not exercising. He ambulates to the mailbox and back. Patient stated that  He is not using salt on his food. Patient has agreed to further outreach calls.   Current Medications:  Current Outpatient Medications  Medication Sig Dispense Refill  . clopidogrel (PLAVIX) 75 MG tablet Take 1 tablet (75 mg total) by mouth daily. 90 tablet 3  . furosemide (LASIX) 20 MG tablet Take 1 tablet (20 mg total) by mouth daily. 30 tablet 11  . lisinopril (PRINIVIL,ZESTRIL) 40 MG tablet Take 1 tablet by mouth daily.    . metoprolol succinate (TOPROL-XL) 50 MG 24 hr tablet Take 1 tablet (50 mg total) by mouth daily. Take with or immediately following a meal. 90 tablet 3  . Multiple Vitamins-Minerals (MULTIVITAMIN ADULTS 50+ PO) Take 1 tablet by mouth daily.     No current facility-administered medications for this visit.     Functional Status:  In your present state of health, do you have any difficulty performing the following activities: 04/28/2019 11/24/2018  Hearing? Y Y  Comment lt ear deaf deaf in lt ear  Vision? Y Y  Comment rt eye blindness blind lt eye  Difficulty concentrating or making decisions? N N  Walking or climbing stairs? Y Y  Comment ambulates with walker at all times ambulates with walker  Dressing or bathing? Y Y  Doing errands, shopping? N Y  Comment daughter assists patient patient does drive some but hasn't in over 3 weeks/ dtr runs errands  Preparing Food and eating ? [name]  Comment daughter prepare food family assist  Using the Toilet? N -  In the past six months, have you accidently leaked urine? Y Y   Do you have problems with loss of bowel control? N N  Managing your Medications? [name]  Comment daughter arranges medications daughter fixes  Managing your Finances? N N  Housekeeping or managing your Housekeeping? [name]  Comment daughter assists family assists  Some recent data might be hidden    Fall/Depression Screening: Fall Risk  04/28/2019 01/26/2019 11/24/2018  Falls in the past year? 0 0 0  Number falls in past yr: 0 - -  Injury with Fall? 0 - -  Risk for fall due to : Impaired balance/gait;Impaired mobility Impaired balance/gait;Impaired mobility -  Follow up Falls evaluation completed;Falls prevention discussed;Education provided Falls evaluation completed;Falls prevention discussed -   PHQ 2/9 Scores 04/28/2019 01/26/2019 11/24/2018 07/28/2018  PHQ - 2 Score 0 0 0 0   THN CM Care Plan Problem One     Most Recent Value  Care Plan Problem One  Knowledge Deficit in Self Management of Congestive Heart Failure  Role Documenting the Problem One  [place] for Problem One  Active  Interventions for Problem One Long Term Goal  RN reiterates the zones and action plan. Patient understands to do daily weights and monitor swelling. RN will follow up for reiterating and support.  THN CM Short Term Goal #1 Met Date  04/28/19  THN CM Short Term Goal #2   Patient will verbalize understanding and following coronavirus safety precautions within the next 30 days  THN CM Short Term Goal #2 Met Date  04/28/19  THN CM Short Term Goal #3  Patient will verbalize follow up on Health Maintenance within the next 30 days  THN CM Short Term Goal #3 Start Date  04/28/19  Interventions for Short Tern Goal #3  RN discussed health maintenance care. RN discussed getting flu shot and eye exam. RN sent reminder letter. RN will follow up for compliance      Assessment:  Patient is doing daily weights Patient is adhering to low sodium diet Patient does not have any lower extremity edema Patient is  practicing pandemic precautions Patient has adequate food and mediation during the pandemic  Plan:  RN reiterated CHF zones and action plan RN reiterated daily weights RN discussed the importance of Health Maintenance RN sent patient reminder for eye exam and flu shot RN will follow up within the month of December  [place] [facility] [phone]

## 2019-07-28 ENCOUNTER — Other Ambulatory Visit: Payer: Self-pay | Admitting: *Deleted

## 2019-07-28 NOTE — Patient Outreach (Signed)
[facility]) Care Management  [place]  07/28/2019   Clinton Holmes May 25, 1928 [id number]  [place] telephone call to patient.  Hipaa compliance verified. Per patient he is doing good. Patient is weighing daily. His weight is staying between 242 and 243. Patient does not have any swelling. No recent falls. Monitoring the salt in his diet. Patient is very hard of hearing. Patient has agreed to follow up outreach calls.   Encounter Medications:  Outpatient Encounter Medications as of 07/28/2019  Medication Sig  . clopidogrel (PLAVIX) 75 MG tablet Take 1 tablet (75 mg total) by mouth daily.  . furosemide (LASIX) 20 MG tablet Take 1 tablet (20 mg total) by mouth daily.  Marland Kitchen lisinopril (PRINIVIL,ZESTRIL) 40 MG tablet Take 1 tablet by mouth daily.  . metoprolol succinate (TOPROL-XL) 50 MG 24 hr tablet Take 1 tablet (50 mg total) by mouth daily. Take with or immediately following a meal.  . Multiple Vitamins-Minerals (MULTIVITAMIN ADULTS 50+ PO) Take 1 tablet by mouth daily.   No facility-administered encounter medications on file as of 07/28/2019.    Functional Status:  In your present state of health, do you have any difficulty performing the following activities: 04/28/2019 11/24/2018  Hearing? Y Y  Comment lt ear deaf deaf in lt ear  Vision? Y Y  Comment rt eye blindness blind lt eye  Difficulty concentrating or making decisions? N N  Walking or climbing stairs? Y Y  Comment ambulates with walker at all times ambulates with walker  Dressing or bathing? Y Y  Doing errands, shopping? N Y  Comment daughter assists patient patient does drive some but hasn't in over 3 weeks/ dtr runs errands  Preparing Food and eating ? [name]  Comment daughter prepare food family assist  Using the Toilet? N -  In the past six months, have you accidently leaked urine? Y Y  Do you have problems with loss of bowel control? N N  Managing your Medications? [name]  Comment daughter  arranges medications daughter fixes  Managing your Finances? N N  Housekeeping or managing your Housekeeping? [name]  Comment daughter assists family assists  Some recent data might be hidden    Fall/Depression Screening: Fall Risk  07/28/2019 04/28/2019 01/26/2019  Falls in the past year? 0 0 0  Number falls in past yr: 0 0 -  Injury with Fall? 0 0 -  Risk for fall due to : Impaired balance/gait;Impaired mobility Impaired balance/gait;Impaired mobility Impaired balance/gait;Impaired mobility  Follow up Falls evaluation completed Falls evaluation completed;Falls prevention discussed;Education provided Falls evaluation completed;Falls prevention discussed   PHQ 2/9 Scores 07/28/2019 04/28/2019 01/26/2019 11/24/2018 07/28/2018  PHQ - 2 Score 0 0 0 0 0   THN CM Care Plan Problem One     Most Recent Value  Care Plan Problem One  Knowledge Deficit in Self Management of Congestive Heart Failure  Role Documenting the Problem One  [place] for Problem One  Active  THN Long Term Goal   Patient will not have any admissions due to CHF exacerbation  THN Long Term Goal Start Date  07/28/19  Interventions for Problem One Long Term Goal  RN reiterates ction plan and zones to remind patient what to do to prevent CHF exacerbation. RN will reiterate with each outreach  THN CM Short Term Goal #1   Patient will verbalize weighing daily and keeping a record within the next 30 days  THN CM Short Term  Goal #1 Start Date  07/28/19  Interventions for Short Term Goal #1  RN reiterates the importance of weighing and whatit means. RN will continue to reiterate each outreach  [facility] CM Short Term Goal #2   Patient will verbalize understanding and following coronavirus safety precautions within the next 30 days  THN CM Short Term Goal #2 Start Date  07/28/19  Interventions for Short Term Goal #2  RN reiterates as long as pandemic is occuring.  THN CM Short Term Goal #3  Patient will verbalize follow up on  Health Maintenance within the next 30 days  THN CM Short Term Goal #3 Start Date  07/28/19  Interventions for Short Tern Goal #3  RN reiterates the importance of health maintenance and encourages patient on follow up care,     Assessment:  Patient is weighing daily Patient is not using salt in food Patient will benefit from Health Coach telephonic outreach for education and support for CHF self management.  Plan:  RN reiterates zones and action plan for CHF RN checks compliance for daily weights RN sent a 2021 calendar book for patient to document weights in RN will follow up within the month of March RN reminds patient of Health Maintenance care  [place] [facility] [phone]

## 2019-08-13 ENCOUNTER — Other Ambulatory Visit: Payer: Self-pay

## 2019-08-13 DIAGNOSIS — R001 Bradycardia, unspecified: Secondary | ICD-10-CM | POA: Diagnosis not present

## 2019-08-13 DIAGNOSIS — I5043 Acute on chronic combined systolic (congestive) and diastolic (congestive) heart failure: Secondary | ICD-10-CM | POA: Diagnosis present

## 2019-08-13 DIAGNOSIS — Z20822 Contact with and (suspected) exposure to covid-19: Secondary | ICD-10-CM | POA: Diagnosis present

## 2019-08-13 DIAGNOSIS — I428 Other cardiomyopathies: Secondary | ICD-10-CM | POA: Diagnosis present

## 2019-08-13 DIAGNOSIS — N170 Acute kidney failure with tubular necrosis: Secondary | ICD-10-CM | POA: Diagnosis present

## 2019-08-13 DIAGNOSIS — K529 Noninfective gastroenteritis and colitis, unspecified: Secondary | ICD-10-CM | POA: Diagnosis not present

## 2019-08-13 DIAGNOSIS — N179 Acute kidney failure, unspecified: Secondary | ICD-10-CM | POA: Diagnosis not present

## 2019-08-13 DIAGNOSIS — J9811 Atelectasis: Secondary | ICD-10-CM | POA: Diagnosis present

## 2019-08-13 DIAGNOSIS — R1084 Generalized abdominal pain: Secondary | ICD-10-CM | POA: Diagnosis not present

## 2019-08-13 DIAGNOSIS — R52 Pain, unspecified: Secondary | ICD-10-CM | POA: Diagnosis not present

## 2019-08-13 DIAGNOSIS — I083 Combined rheumatic disorders of mitral, aortic and tricuspid valves: Secondary | ICD-10-CM | POA: Diagnosis present

## 2019-08-13 DIAGNOSIS — I700 Atherosclerosis of aorta: Secondary | ICD-10-CM | POA: Diagnosis present

## 2019-08-13 DIAGNOSIS — Z79899 Other long term (current) drug therapy: Secondary | ICD-10-CM

## 2019-08-13 DIAGNOSIS — I2510 Atherosclerotic heart disease of native coronary artery without angina pectoris: Secondary | ICD-10-CM | POA: Diagnosis present

## 2019-08-13 DIAGNOSIS — K644 Residual hemorrhoidal skin tags: Secondary | ICD-10-CM | POA: Diagnosis present

## 2019-08-13 DIAGNOSIS — I4819 Other persistent atrial fibrillation: Secondary | ICD-10-CM | POA: Diagnosis not present

## 2019-08-13 DIAGNOSIS — M1611 Unilateral primary osteoarthritis, right hip: Secondary | ICD-10-CM | POA: Diagnosis present

## 2019-08-13 DIAGNOSIS — Z8249 Family history of ischemic heart disease and other diseases of the circulatory system: Secondary | ICD-10-CM

## 2019-08-13 DIAGNOSIS — E785 Hyperlipidemia, unspecified: Secondary | ICD-10-CM | POA: Diagnosis present

## 2019-08-13 DIAGNOSIS — N1832 Chronic kidney disease, stage 3b: Secondary | ICD-10-CM | POA: Diagnosis present

## 2019-08-13 DIAGNOSIS — Z87891 Personal history of nicotine dependence: Secondary | ICD-10-CM

## 2019-08-13 DIAGNOSIS — Z03818 Encounter for observation for suspected exposure to other biological agents ruled out: Secondary | ICD-10-CM | POA: Diagnosis not present

## 2019-08-13 DIAGNOSIS — I472 Ventricular tachycardia: Secondary | ICD-10-CM | POA: Diagnosis not present

## 2019-08-13 DIAGNOSIS — I272 Pulmonary hypertension, unspecified: Secondary | ICD-10-CM | POA: Diagnosis present

## 2019-08-13 DIAGNOSIS — I4891 Unspecified atrial fibrillation: Secondary | ICD-10-CM | POA: Diagnosis not present

## 2019-08-13 DIAGNOSIS — M810 Age-related osteoporosis without current pathological fracture: Secondary | ICD-10-CM | POA: Diagnosis present

## 2019-08-13 DIAGNOSIS — E876 Hypokalemia: Secondary | ICD-10-CM | POA: Diagnosis not present

## 2019-08-13 DIAGNOSIS — Z8673 Personal history of transient ischemic attack (TIA), and cerebral infarction without residual deficits: Secondary | ICD-10-CM

## 2019-08-13 DIAGNOSIS — Z7902 Long term (current) use of antithrombotics/antiplatelets: Secondary | ICD-10-CM

## 2019-08-13 DIAGNOSIS — I248 Other forms of acute ischemic heart disease: Secondary | ICD-10-CM | POA: Diagnosis present

## 2019-08-13 DIAGNOSIS — I714 Abdominal aortic aneurysm, without rupture: Secondary | ICD-10-CM | POA: Diagnosis present

## 2019-08-13 DIAGNOSIS — I493 Ventricular premature depolarization: Secondary | ICD-10-CM | POA: Diagnosis present

## 2019-08-13 DIAGNOSIS — I130 Hypertensive heart and chronic kidney disease with heart failure and stage 1 through stage 4 chronic kidney disease, or unspecified chronic kidney disease: Principal | ICD-10-CM | POA: Diagnosis present

## 2019-08-13 DIAGNOSIS — Z96653 Presence of artificial knee joint, bilateral: Secondary | ICD-10-CM | POA: Diagnosis present

## 2019-08-13 DIAGNOSIS — R0902 Hypoxemia: Secondary | ICD-10-CM | POA: Diagnosis not present

## 2019-08-13 LAB — COMPREHENSIVE METABOLIC PANEL
ALT: 20 U/L (ref 0–44)
AST: 30 U/L (ref 15–41)
Albumin: 4.7 g/dL (ref 3.5–5.0)
Alkaline Phosphatase: 61 U/L (ref 38–126)
Anion gap: 13 (ref 5–15)
BUN: 30 mg/dL — ABNORMAL HIGH (ref 8–23)
CO2: 25 mmol/L (ref 22–32)
Calcium: 10 mg/dL (ref 8.9–10.3)
Chloride: 103 mmol/L (ref 98–111)
Creatinine, Ser: 1.76 mg/dL — ABNORMAL HIGH (ref 0.61–1.24)
GFR calc Af Amer: 39 mL/min — ABNORMAL LOW (ref >60)
GFR calc non Af Amer: 34 mL/min — ABNORMAL LOW (ref >60)
Glucose, Bld: 133 mg/dL — ABNORMAL HIGH (ref 70–99)
Potassium: 4.3 mmol/L (ref 3.5–5.1)
Sodium: 141 mmol/L (ref 135–145)
Total Bilirubin: 1.2 mg/dL (ref 0.3–1.2)
Total Protein: 7.4 g/dL (ref 6.5–8.1)

## 2019-08-13 LAB — CBC
HCT: 44.6 % (ref 39.0–52.0)
Hemoglobin: 13.9 g/dL (ref 13.0–17.0)
MCH: 28.1 pg (ref 26.0–34.0)
MCHC: 31.2 g/dL (ref 30.0–36.0)
MCV: 90.1 fL (ref 80.0–100.0)
Platelets: 177 10*3/uL (ref 150–400)
RBC: 4.95 MIL/uL (ref 4.22–5.81)
RDW: 15.1 % (ref 11.5–15.5)
WBC: 8.8 10*3/uL (ref 4.0–10.5)
nRBC: 0 % (ref 0.0–0.2)

## 2019-08-13 LAB — TROPONIN I (HIGH SENSITIVITY)
Troponin I (High Sensitivity): 19 ng/L — ABNORMAL HIGH (ref <18)
Troponin I (High Sensitivity): 19 ng/L — ABNORMAL HIGH (ref <18)

## 2019-08-13 LAB — LIPASE, BLOOD: Lipase: 28 U/L (ref 11–51)

## 2019-08-13 MED ORDER — ONDANSETRON HCL 4 MG/2ML IJ SOLN
INTRAMUSCULAR | Status: AC
  Filled 2019-08-13: qty 2

## 2019-08-13 MED ORDER — ONDANSETRON HCL 4 MG/2ML IJ SOLN
4.0000 mg | Freq: Once | INTRAMUSCULAR | Status: AC
  Administered 2019-08-13: 4 mg via INTRAVENOUS

## 2019-08-13 NOTE — ED Notes (Signed)
Pt states he is feeling better after zofran administration. No more episodes of emesis.

## 2019-08-13 NOTE — ED Triage Notes (Signed)
FIRST NURSE NOTE- pt here for abdominal pain. VSS with EMS.  Per EMS pt appears to have ascites without liver hx.

## 2019-08-13 NOTE — ED Notes (Signed)
While performing EKG pt began vomiting. Emesis bag provided. Medication ordered.

## 2019-08-13 NOTE — ED Triage Notes (Signed)
Pt arrives to ED via [facility] for central abd pain that began at 1p today. Pt has nasal congestion. Still has abd organs. Denies hernia. Denies N&V&D.

## 2019-08-14 ENCOUNTER — Inpatient Hospital Stay
Admission: EM | Admit: 2019-08-14 | Discharge: 2019-08-23 | DRG: 291 | Disposition: A | Discharge status: Home Health | Payer: Medicare Other | Attending: Internal Medicine | Admitting: Internal Medicine

## 2019-08-14 ENCOUNTER — Encounter: Payer: Self-pay | Admitting: Radiology

## 2019-08-14 ENCOUNTER — Observation Stay (HOSPITAL_BASED_OUTPATIENT_CLINIC_OR_DEPARTMENT_OTHER)
Admit: 2019-08-14 | Discharge: 2019-08-14 | Disposition: A | Discharge status: Home / Self Care | Payer: Medicare Other | Attending: Family Medicine | Admitting: Family Medicine

## 2019-08-14 ENCOUNTER — Emergency Department: Payer: Medicare Other

## 2019-08-14 DIAGNOSIS — I493 Ventricular premature depolarization: Secondary | ICD-10-CM | POA: Diagnosis present

## 2019-08-14 DIAGNOSIS — N189 Chronic kidney disease, unspecified: Secondary | ICD-10-CM

## 2019-08-14 DIAGNOSIS — J90 Pleural effusion, not elsewhere classified: Secondary | ICD-10-CM | POA: Diagnosis not present

## 2019-08-14 DIAGNOSIS — K529 Noninfective gastroenteritis and colitis, unspecified: Secondary | ICD-10-CM | POA: Diagnosis not present

## 2019-08-14 DIAGNOSIS — I714 Abdominal aortic aneurysm, without rupture: Secondary | ICD-10-CM | POA: Diagnosis present

## 2019-08-14 DIAGNOSIS — J9811 Atelectasis: Secondary | ICD-10-CM | POA: Diagnosis present

## 2019-08-14 DIAGNOSIS — R2681 Unsteadiness on feet: Secondary | ICD-10-CM | POA: Diagnosis not present

## 2019-08-14 DIAGNOSIS — I4891 Unspecified atrial fibrillation: Secondary | ICD-10-CM

## 2019-08-14 DIAGNOSIS — I361 Nonrheumatic tricuspid (valve) insufficiency: Secondary | ICD-10-CM | POA: Diagnosis not present

## 2019-08-14 DIAGNOSIS — N183 Chronic kidney disease, stage 3 unspecified: Secondary | ICD-10-CM | POA: Diagnosis present

## 2019-08-14 DIAGNOSIS — R0902 Hypoxemia: Secondary | ICD-10-CM | POA: Diagnosis not present

## 2019-08-14 DIAGNOSIS — I5042 Chronic combined systolic (congestive) and diastolic (congestive) heart failure: Secondary | ICD-10-CM | POA: Diagnosis not present

## 2019-08-14 DIAGNOSIS — N170 Acute kidney failure with tubular necrosis: Secondary | ICD-10-CM | POA: Diagnosis present

## 2019-08-14 DIAGNOSIS — K644 Residual hemorrhoidal skin tags: Secondary | ICD-10-CM | POA: Diagnosis present

## 2019-08-14 DIAGNOSIS — I472 Ventricular tachycardia: Secondary | ICD-10-CM | POA: Diagnosis not present

## 2019-08-14 DIAGNOSIS — R109 Unspecified abdominal pain: Secondary | ICD-10-CM | POA: Diagnosis present

## 2019-08-14 DIAGNOSIS — I5020 Unspecified systolic (congestive) heart failure: Secondary | ICD-10-CM | POA: Diagnosis not present

## 2019-08-14 DIAGNOSIS — Z8249 Family history of ischemic heart disease and other diseases of the circulatory system: Secondary | ICD-10-CM | POA: Diagnosis not present

## 2019-08-14 DIAGNOSIS — I700 Atherosclerosis of aorta: Secondary | ICD-10-CM | POA: Diagnosis present

## 2019-08-14 DIAGNOSIS — I083 Combined rheumatic disorders of mitral, aortic and tricuspid valves: Secondary | ICD-10-CM | POA: Diagnosis present

## 2019-08-14 DIAGNOSIS — N1832 Chronic kidney disease, stage 3b: Secondary | ICD-10-CM | POA: Diagnosis present

## 2019-08-14 DIAGNOSIS — I340 Nonrheumatic mitral (valve) insufficiency: Secondary | ICD-10-CM | POA: Diagnosis not present

## 2019-08-14 DIAGNOSIS — J9600 Acute respiratory failure, unspecified whether with hypoxia or hypercapnia: Secondary | ICD-10-CM

## 2019-08-14 DIAGNOSIS — I350 Nonrheumatic aortic (valve) stenosis: Secondary | ICD-10-CM | POA: Diagnosis not present

## 2019-08-14 DIAGNOSIS — Z96653 Presence of artificial knee joint, bilateral: Secondary | ICD-10-CM | POA: Diagnosis present

## 2019-08-14 DIAGNOSIS — E785 Hyperlipidemia, unspecified: Secondary | ICD-10-CM | POA: Diagnosis present

## 2019-08-14 DIAGNOSIS — I2510 Atherosclerotic heart disease of native coronary artery without angina pectoris: Secondary | ICD-10-CM | POA: Diagnosis not present

## 2019-08-14 DIAGNOSIS — I129 Hypertensive chronic kidney disease with stage 1 through stage 4 chronic kidney disease, or unspecified chronic kidney disease: Secondary | ICD-10-CM | POA: Diagnosis not present

## 2019-08-14 DIAGNOSIS — I130 Hypertensive heart and chronic kidney disease with heart failure and stage 1 through stage 4 chronic kidney disease, or unspecified chronic kidney disease: Secondary | ICD-10-CM | POA: Diagnosis present

## 2019-08-14 DIAGNOSIS — I509 Heart failure, unspecified: Secondary | ICD-10-CM

## 2019-08-14 DIAGNOSIS — Z7902 Long term (current) use of antithrombotics/antiplatelets: Secondary | ICD-10-CM | POA: Diagnosis not present

## 2019-08-14 DIAGNOSIS — I5023 Acute on chronic systolic (congestive) heart failure: Secondary | ICD-10-CM

## 2019-08-14 DIAGNOSIS — R1030 Lower abdominal pain, unspecified: Secondary | ICD-10-CM

## 2019-08-14 DIAGNOSIS — N179 Acute kidney failure, unspecified: Secondary | ICD-10-CM

## 2019-08-14 DIAGNOSIS — I272 Pulmonary hypertension, unspecified: Secondary | ICD-10-CM | POA: Diagnosis present

## 2019-08-14 DIAGNOSIS — I428 Other cardiomyopathies: Secondary | ICD-10-CM | POA: Diagnosis present

## 2019-08-14 DIAGNOSIS — I5043 Acute on chronic combined systolic (congestive) and diastolic (congestive) heart failure: Secondary | ICD-10-CM | POA: Diagnosis not present

## 2019-08-14 DIAGNOSIS — R0603 Acute respiratory distress: Secondary | ICD-10-CM

## 2019-08-14 DIAGNOSIS — I420 Dilated cardiomyopathy: Secondary | ICD-10-CM | POA: Diagnosis not present

## 2019-08-14 DIAGNOSIS — Z20822 Contact with and (suspected) exposure to covid-19: Secondary | ICD-10-CM | POA: Diagnosis present

## 2019-08-14 DIAGNOSIS — I248 Other forms of acute ischemic heart disease: Secondary | ICD-10-CM | POA: Diagnosis present

## 2019-08-14 DIAGNOSIS — N1831 Chronic kidney disease, stage 3a: Secondary | ICD-10-CM | POA: Diagnosis not present

## 2019-08-14 DIAGNOSIS — I4819 Other persistent atrial fibrillation: Secondary | ICD-10-CM | POA: Diagnosis present

## 2019-08-14 DIAGNOSIS — M1611 Unilateral primary osteoarthritis, right hip: Secondary | ICD-10-CM | POA: Diagnosis present

## 2019-08-14 DIAGNOSIS — M810 Age-related osteoporosis without current pathological fracture: Secondary | ICD-10-CM | POA: Diagnosis present

## 2019-08-14 LAB — URINALYSIS, COMPLETE (UACMP) WITH MICROSCOPIC
Bacteria, UA: NONE SEEN
Bilirubin Urine: NEGATIVE
Glucose, UA: NEGATIVE mg/dL
Hgb urine dipstick: NEGATIVE
Ketones, ur: NEGATIVE mg/dL
Nitrite: NEGATIVE
Protein, ur: 30 mg/dL — AB
Specific Gravity, Urine: 1.039 — ABNORMAL HIGH (ref 1.005–1.030)
pH: 5 (ref 5.0–8.0)

## 2019-08-14 LAB — PROTIME-INR
INR: 1.2 (ref 0.8–1.2)
Prothrombin Time: 15 seconds (ref 11.4–15.2)

## 2019-08-14 LAB — C DIFFICILE QUICK SCREEN W PCR REFLEX
C Diff antigen: NEGATIVE
C Diff interpretation: NOT DETECTED
C Diff toxin: NEGATIVE

## 2019-08-14 LAB — ECHOCARDIOGRAM COMPLETE

## 2019-08-14 LAB — POC SARS CORONAVIRUS 2 AG: SARS Coronavirus 2 Ag: NEGATIVE

## 2019-08-14 LAB — APTT: aPTT: 32 seconds (ref 24–36)

## 2019-08-14 MED ORDER — APIXABAN 2.5 MG PO TABS
2.5000 mg | ORAL_TABLET | Freq: Two times a day (BID) | ORAL | Status: DC
  Administered 2019-08-14 – 2019-08-23 (×18): 2.5 mg via ORAL
  Filled 2019-08-14 (×20): qty 1

## 2019-08-14 MED ORDER — MAGNESIUM HYDROXIDE 400 MG/5ML PO SUSP
30.0000 mL | Freq: Every day | ORAL | Status: DC | PRN
  Administered 2019-08-21 – 2019-08-22 (×2): 30 mL via ORAL
  Filled 2019-08-14 (×2): qty 30

## 2019-08-14 MED ORDER — ONDANSETRON HCL 4 MG/2ML IJ SOLN: 4.0000 mg | Freq: Four times a day (QID) | INTRAMUSCULAR | Status: DC | PRN

## 2019-08-14 MED ORDER — MORPHINE SULFATE (PF) 2 MG/ML IV SOLN
2.0000 mg | Freq: Once | INTRAVENOUS | Status: DC
  Filled 2019-08-14: qty 1

## 2019-08-14 MED ORDER — HEPARIN (PORCINE) 25000 UT/250ML-% IV SOLN
1150.0000 [IU]/h | INTRAVENOUS | Status: DC
  Administered 2019-08-14: 1150 [IU]/h via INTRAVENOUS
  Filled 2019-08-14: qty 250

## 2019-08-14 MED ORDER — ASPIRIN EC 81 MG PO TBEC
81.0000 mg | DELAYED_RELEASE_TABLET | Freq: Every day | ORAL | Status: DC
  Administered 2019-08-15 – 2019-08-23 (×8): 81 mg via ORAL
  Filled 2019-08-14 (×10): qty 1

## 2019-08-14 MED ORDER — METOPROLOL TARTRATE 5 MG/5ML IV SOLN
5.0000 mg | Freq: Once | INTRAVENOUS | Status: AC
  Administered 2019-08-14: 5 mg via INTRAVENOUS

## 2019-08-14 MED ORDER — SODIUM CHLORIDE 0.9 % IV BOLUS
1000.0000 mL | Freq: Once | INTRAVENOUS | Status: AC
  Administered 2019-08-14: 1000 mL via INTRAVENOUS

## 2019-08-14 MED ORDER — ACETAMINOPHEN 650 MG RE SUPP: 650.0000 mg | Freq: Four times a day (QID) | RECTAL | Status: DC | PRN

## 2019-08-14 MED ORDER — METOPROLOL TARTRATE 5 MG/5ML IV SOLN
INTRAVENOUS | Status: AC
  Filled 2019-08-14: qty 5

## 2019-08-14 MED ORDER — ACETAMINOPHEN 325 MG PO TABS: 650.0000 mg | ORAL_TABLET | Freq: Four times a day (QID) | ORAL | Status: DC | PRN

## 2019-08-14 MED ORDER — TRAZODONE HCL 50 MG PO TABS: 25.0000 mg | ORAL_TABLET | Freq: Every evening | ORAL | Status: DC | PRN

## 2019-08-14 MED ORDER — HEPARIN BOLUS VIA INFUSION
4000.0000 [IU] | Freq: Once | INTRAVENOUS | Status: AC
  Administered 2019-08-14: 4000 [IU] via INTRAVENOUS
  Filled 2019-08-14: qty 4000

## 2019-08-14 MED ORDER — ONDANSETRON HCL 4 MG/2ML IJ SOLN
4.0000 mg | Freq: Once | INTRAMUSCULAR | Status: AC
  Administered 2019-08-14: 4 mg via INTRAVENOUS
  Filled 2019-08-14: qty 2

## 2019-08-14 MED ORDER — IOHEXOL 300 MG/ML  SOLN
100.0000 mL | Freq: Once | INTRAMUSCULAR | Status: AC | PRN
  Administered 2019-08-14: 100 mL via INTRAVENOUS

## 2019-08-14 MED ORDER — ONDANSETRON HCL 4 MG PO TABS: 4.0000 mg | ORAL_TABLET | Freq: Four times a day (QID) | ORAL | Status: DC | PRN

## 2019-08-14 MED ORDER — METOPROLOL SUCCINATE ER 50 MG PO TB24
50.0000 mg | ORAL_TABLET | Freq: Every day | ORAL | Status: DC
  Administered 2019-08-14 – 2019-08-15 (×2): 50 mg via ORAL
  Filled 2019-08-14 (×2): qty 1

## 2019-08-14 MED ORDER — ENOXAPARIN SODIUM 40 MG/0.4ML ~~LOC~~ SOLN: 40.0000 mg | SUBCUTANEOUS | Status: DC

## 2019-08-14 MED ORDER — SODIUM CHLORIDE 0.9 % IV SOLN: INTRAVENOUS | Status: DC

## 2019-08-14 MED ORDER — ADULT MULTIVITAMIN W/MINERALS CH
ORAL_TABLET | Freq: Every day | ORAL | Status: DC
  Administered 2019-08-14 – 2019-08-23 (×9): 1 via ORAL
  Filled 2019-08-14 (×10): qty 1

## 2019-08-14 MED ORDER — CLOPIDOGREL BISULFATE 75 MG PO TABS
75.0000 mg | ORAL_TABLET | Freq: Every day | ORAL | Status: DC
  Administered 2019-08-14: 75 mg via ORAL
  Filled 2019-08-14: qty 1

## 2019-08-14 NOTE — Progress Notes (Signed)
PROGRESS NOTE    Clinton WHALE  [id number] DOB: [birth year] DOA: 08/14/2019 PCP: Philmore Pali, NP      Brief Narrative:  Clinton Holmes  is a 86 y.o. Caucasian male with a known history of coronary artery disease, stage III chronic kidney disease, hypertension and osteoporosis presenting with acute onset of abdominal pain mainly in the left lower quadrant with associated recurrent vomiting including once in the lobby.  He denied any diarrhea.  No bilious vomitus or hematemesis.  No fever or chills.  No headache or dizziness or blurred vision.  No chest pain or dyspnea or palpitations.  No cough or wheezing.  No report of recent antibiotic treatment.  Upon presentation to the emergency room, vital signs were within normal.  Labs revealed BUN of 30 and creatinine 1.76 compared to 32 and 1.47 on 10/30/2018.  High-sensitivity troponin I was 19 twice.  EKG showed atrial fibrillation with rapid ventricular response with a rate of 117 with Q waves anteroseptally and inferiorly.  Abdominal pelvic CT scan was remarkable for signs of enteritis likely infectious or inflammatory and less likely ischemic given the length of the bowel involvement and patency of mesenteric vessels.  Also showed infrarenal aortic aneurysm with diameter of 3.7 cm with recommendation for ultrasound in 2 years.  It also showed small bilateral pleural effusions with adjacent atelectasis and mild cardiomegaly.  The patient was given 5 mg of IV Lopressor, 2 mg of IV morphine sulfate, 4 mg of IV Zofran twice and 1 L bolus of IV normal saline.  The patient will be admitted to medical monitored bed for further evaluation and management.   Assessment & Plan:  Enteritis -IV fluids -Follow stool studies   New onset atrial fibrillation with RVR -Start heparin -Start Eliquis -Continue metoprolol  AKI on CKD 3B  Hypertension Cerebrovascular disease secondary prevention He is on Plavix due to stroke in 2016. -Stop  Plavix -Start baby aspirin with apixaban -Continue Toprol   DVT prophylaxis: N/A on Eliquis Code Status: FULL Family Communication:  MDM and disposition Plan: This is a no charge note.  For further details, please see H&P by my partner Dr. Sidney Ace from earlier today.  The below labs and imaging reports were reviewed and summarized above.    The patient was admitted with diarrhea and vomiing.    Objective: Vitals:   08/14/19 0500 08/14/19 0600 08/14/19 0732 08/14/19 0948  BP: 111/68 114/84 (!) 117/96 119/86  Pulse: 84  94 (!) 103  Resp: (!) 21 17 17 17   Temp:   97.9 F (36.6 C) 98.6 F (37 C)  TempSrc:   Oral   SpO2: 95%  98% 90%  Weight:      Height:        Intake/Output Summary (Last 24 hours) at 08/14/2019 1355 Last data filed at 08/14/2019 0300 Gross per 24 hour  Intake 1000 ml  Output --  Net 1000 ml   Filed Weights   08/13/19 1800  Weight: 109.3 kg    Examination: The patient was seen and examined.      Data Reviewed: I have personally reviewed following labs and imaging studies:  CBC: Recent Labs  Lab 08/13/19 1809  WBC 8.8  HGB 13.9  HCT 44.6  MCV 90.1  PLT [id number]   Basic Metabolic Panel: Recent Labs  Lab 08/13/19 1809  NA 141  K 4.3  CL 103  CO2 25  GLUCOSE 133*  BUN 30*  CREATININE 1.76*  CALCIUM 10.0  GFR: Estimated Creatinine Clearance: 36.3 mL/min (A) (by C-G formula based on SCr of 1.76 mg/dL (H)). Liver Function Tests: Recent Labs  Lab 08/13/19 1809  AST 30  ALT 20  ALKPHOS 61  BILITOT 1.2  PROT 7.4  ALBUMIN 4.7   Recent Labs  Lab 08/13/19 1809  LIPASE 28   No results for input(s): AMMONIA in the last 168 hours. Coagulation Profile: Recent Labs  Lab 08/14/19 0624  INR 1.2   Cardiac Enzymes: No results for input(s): CKTOTAL, CKMB, CKMBINDEX, TROPONINI in the last 168 hours. BNP (last 3 results) No results for input(s): PROBNP in the last 8760 hours. HbA1C: No results for input(s): HGBA1C in the last 72  hours. CBG: No results for input(s): GLUCAP in the last 168 hours. Lipid Profile: No results for input(s): CHOL, HDL, LDLCALC, TRIG, CHOLHDL, LDLDIRECT in the last 72 hours. Thyroid Function Tests: No results for input(s): TSH, T4TOTAL, FREET4, T3FREE, THYROIDAB in the last 72 hours. Anemia Panel: No results for input(s): VITAMINB12, FOLATE, FERRITIN, TIBC, IRON, RETICCTPCT in the last 72 hours. Urine analysis:    Component Value Date/Time   COLORURINE YELLOW 07/04/2013 [place] 07/04/2013 0541   LABSPEC 1.018 07/04/2013 0541   PHURINE 6.0 07/04/2013 0541   GLUCOSEU NEGATIVE 07/04/2013 0541   HGBUR NEGATIVE 07/04/2013 0541   BILIRUBINUR NEGATIVE 07/04/2013 0541   KETONESUR NEGATIVE 07/04/2013 0541   PROTEINUR NEGATIVE 07/04/2013 0541   UROBILINOGEN 1.0 07/04/2013 0541   NITRITE NEGATIVE 07/04/2013 0541   LEUKOCYTESUR TRACE (A) 07/04/2013 0541   Sepsis Labs: @LABRCNTIP (procalcitonin:4,lacticacidven:4)  )No results found for this or any previous visit (from the past 240 hour(s)).       Radiology Studies: CT ABDOMEN PELVIS W CONTRAST  Result Date: 08/14/2019 CLINICAL DATA:  86 year old with right lower quadrant pain. EXAM: CT ABDOMEN AND PELVIS WITH CONTRAST TECHNIQUE: Multidetector CT imaging of the abdomen and pelvis was performed using the standard protocol following bolus administration of intravenous contrast. CONTRAST:  145mL OMNIPAQUE IOHEXOL 300 MG/ML  SOLN COMPARISON:  Abdominal CT 07/04/2013. Abdominal MRI 07/21/2013 FINDINGS: Lower chest: Small bilateral pleural effusions with adjacent atelectasis. Mild cardiomegaly. Coronary artery calcifications. No pericardial effusion. Hepatobiliary: Small hepatic cysts, stable from prior exam. Gallbladder physiologically distended, no calcified stone. No biliary dilatation. Pancreas: Mild parenchymal atrophy. No ductal dilatation or inflammation. 2.2 cm lesion in the pancreatic tail is isodense to adjacent splenic  parenchyma, likely splenule, unchanged from prior exam. Spleen: Normal in size without focal abnormality. Adrenals/Urinary Tract: Mild adrenal thickening without dominant adrenal nodule. No hydronephrosis or perinephric edema. Homogeneous renal enhancement with symmetric excretion on delayed phase imaging. Hyperdense left renal cyst measures 2.9 cm, slightly increased from 2.4 cm on 2014 exam. Additional small renal lesions are too small to accurately characterize. Calcification in the lower left kidney is likely parenchymal. Possible nonobstructing right renal stone. Urinary bladder is partially distended without wall thickening. Stomach/Bowel: Small hiatal hernia. Fluid-filled mildly dilated stomach without gastric wall thickening. Proximal small bowel are unremarkable. Moderate to long length of small bowel loops in the pelvis lower abdomen and right pelvis are fluid-filled and slightly prominent, with mild adjacent mesenteric edema and small volume free fluid. No obstruction. No terminal ileal inflammation. No appendicitis. Diminutive appendix tentatively visualized curling adjacent to the cecum. Liquid stool in the cecum and ascending colon. No colonic wall thickening. Small volume of stool in the remainder the colon. Sigmoid colonic tortuosity. No significant diverticular disease. Vascular/Lymphatic: Infrarenal aortic aneurysm maximal dimension 3.7 cm. Diffuse calcified  and noncalcified atheromatous plaque of the abdominal aorta and its branches. Proximal portal vein is patent. The central mesenteric vessels are patent. No enlarged lymph nodes in the abdomen or pelvis. Reproductive: Prominent prostate gland spanning 5.3 cm with central calcifications. Other: Small amount of free fluid in the right lower quadrant tracks into the pelvis. Small amount of right upper quadrant ascites adjacent to the liver. No organized abscess. No free air. Fat within both inguinal canals, left greater than right. Musculoskeletal:  Multilevel degenerative change throughout the lumbar spine. Advanced right hip osteoarthritis. There are no acute or suspicious osseous abnormalities. IMPRESSION: 1. Mildly prominent fluid-filled small bowel loops in the pelvis and right pelvis with mild adjacent mesenteric edema and free fluid. Findings suspicious for enteritis, likely infectious or inflammatory. Possibility of ischemia is considered, however felt less likely given the length of bowel involvement and patency of mesenteric vessels. 2. Infrarenal aortic aneurysm maximal dimension 3.7 cm. Recommend followup by ultrasound in 2 years. This recommendation follows ACR consensus guidelines: White Paper of the ACR Incidental Findings Committee II on Vascular Findings. J Am Coll Radiol 2013; 10:789-794. Aortic aneurysm NOS (ICD10-I71.9) 3. Small bilateral pleural effusions with adjacent atelectasis. Mild cardiomegaly. 4. Additional chronic findings as described. Aortic Atherosclerosis (ICD10-I70.0). Electronically Signed   By: Keith Rake M.D.   On: 08/14/2019 02:19        Scheduled Meds: . apixaban  2.5 mg Oral BID  . clopidogrel  75 mg Oral Daily  . metoprolol succinate  50 mg Oral Daily  .  morphine injection  2 mg Intravenous Once  . multivitamin with minerals   Oral Daily   Continuous Infusions: . sodium chloride 100 mL/hr at 08/14/19 0805     LOS: 0 days    Time spent: 15 minutes    Edwin Dada, MD [facility] 08/14/2019, 1:55 PM     Please page though [place] or Epic secure chat:  For password, contact charge nurse

## 2019-08-14 NOTE — ED Provider Notes (Addendum)
[facility] Emergency Department Provider Note  ____________________________________________   First MD Initiated Contact with Patient 08/14/19 0102     (approximate)  I have reviewed the triage vital signs and the nursing notes.   HISTORY  Chief Complaint Abdominal Pain    HPI Clinton Holmes is a 86 y.o. male with below list of previous medical conditions presents to the emergency department secondary to central abdominal pain which began at 1 PM yesterday.  Patient does admit to nausea and vomiting.  Patient also admits to loose stool.  Patient denies any fever no known sick contact.        Past Medical History:  Diagnosis Date  . CAD (coronary artery disease)    L&RHC 12/23/2014 elevated LVEDP, prox LAD 45%, 60% ramus  . CKD (chronic kidney disease), stage III   . Essential hypertension   . Murmur   . OA (osteoarthritis)    a. 2011 s/p bilat TKA.  . Ventricular bigeminy    a. 12/2014. Started on amio on 12/24/2014    Patient Active Problem List   Diagnosis Date Noted  . CHF (congestive heart failure) ([place]) 07/24/2018  . Aortic stenosis 12/23/2014  . Ventricular bigeminy 12/21/2014  . Essential hypertension   . CKD (chronic kidney disease), stage III   . Hypertension   . OA (osteoarthritis)     Past Surgical History:  Procedure Laterality Date  . CARDIAC CATHETERIZATION N/A 12/23/2014   Procedure: Right/Left Heart Cath and Coronary Angiography;  Surgeon: Belva Crome, MD;  Location: [facility];  Service: Cardiovascular;  Laterality: N/A;  . KNEE SURGERY    . SHOULDER SURGERY Left   . TONSILLECTOMY      Prior to Admission medications   Medication Sig Start Date End Date Taking? Authorizing Provider  clopidogrel (PLAVIX) 75 MG tablet Take 1 tablet (75 mg total) by mouth daily. 11/25/17   Jerline Pain, MD  furosemide (LASIX) 20 MG tablet Take 1 tablet (20 mg total) by mouth daily. 07/26/18 07/26/19  Dustin Flock, MD    lisinopril (PRINIVIL,ZESTRIL) 40 MG tablet Take 1 tablet by mouth daily.    [provider]  metoprolol succinate (TOPROL-XL) 50 MG 24 hr tablet Take 1 tablet (50 mg total) by mouth daily. Take with or immediately following a meal. 11/06/18   Wellington Hampshire, MD  Multiple Vitamins-Minerals (MULTIVITAMIN ADULTS 50+ PO) Take 1 tablet by mouth daily.    [provider]    Allergies Patient has no known allergies.  Family History  Problem Relation Age of Onset  . Heart attack Father        deceased @ 5.  . Congestive Heart Failure Sister        alive in her late 65's.  . Other Brother        alive @ 43.  Marland Kitchen CAD Mother        H/o CABG, deceased @ 72.    Social History Social History   Tobacco Use  . Smoking status: Former Research scientist (life sciences)  . Smokeless tobacco: Never Used  . Tobacco comment: Smoked for a few yrs in the service.  Quit in 1968. Never a heavy smoker.  Substance Use Topics  . Alcohol use: No    Alcohol/week: 0.0 standard drinks  . Drug use: No    Review of Systems Constitutional: No fever/chills Eyes: No visual changes. ENT: No sore throat. Cardiovascular: Denies chest pain. Respiratory: Denies shortness of breath. Gastrointestinal: Positive for abdominal  pain nausea and vomiting no diarrhea.  No constipation. Genitourinary: Negative for dysuria. Musculoskeletal: Negative for neck pain.  Negative for back pain. Integumentary: Negative for rash. Neurological: Negative for headaches, focal weakness or numbness.  ____________________________________________   PHYSICAL EXAM:  VITAL SIGNS: ED Triage Vitals [08/13/19 1800]  Enc Vitals Group     BP (!) 137/91     Pulse Rate 98     Resp 16     Temp 98 F (36.7 C)     Temp Source Oral     SpO2 98 %     Weight 109.3 kg (241 lb)     Height 1.829 m (6')     Head Circumference      Peak Flow      Pain Score 5     Pain Loc      Pain Edu?      Excl. in [place]?      Constitutional: Alert and  oriented.  Eyes: Conjunctivae are normal.  Mouth/Throat: Patient is wearing a mask. Neck: No stridor.  No meningeal signs.   Cardiovascular: tachycardia, irregular rhythm. Good peripheral circulation. Grossly normal heart sounds. Respiratory: Normal respiratory effort.  No retractions. Gastrointestinal: Generalized tenderness to palpation.. No distention.  Musculoskeletal: No lower extremity tenderness nor edema. No gross deformities of extremities. Neurologic:  Normal speech and language. No gross focal neurologic deficits are appreciated.  Skin:  Skin is warm, dry and intact. Psychiatric: Mood and affect are normal. Speech and behavior are normal.  ____________________________________________   LABS (all labs ordered are listed, but only abnormal results are displayed)  Labs Reviewed  COMPREHENSIVE METABOLIC PANEL - Abnormal; Notable for the following components:      Result Value   Glucose, Bld 133 (*)    BUN 30 (*)    Creatinine, Ser 1.76 (*)    GFR calc non Af Amer 34 (*)    GFR calc Af Amer 39 (*)    All other components within normal limits  TROPONIN I (HIGH SENSITIVITY) - Abnormal; Notable for the following components:   Troponin I (High Sensitivity) 19 (*)    All other components within normal limits  TROPONIN I (HIGH SENSITIVITY) - Abnormal; Notable for the following components:   Troponin I (High Sensitivity) 19 (*)    All other components within normal limits  LIPASE, BLOOD  CBC  URINALYSIS, COMPLETE (UACMP) WITH MICROSCOPIC  POC SARS CORONAVIRUS 2 AG -  ED   ____________________________________________  EKG ED ECG REPORT I, [place] N Makailey Hodgkin, the attending physician, personally viewed and interpreted this ECG.   Date: 08/14/2019  EKG Time: 6:05 PM  Rate: 117  Rhythm: Atrial fibrillation with rapid ventricular response  Axis: Normal  Intervals: Irregular RR interval  ST&T Change: None  ____________________________________________  RADIOLOGY I, [place]  N Dillon [place], personally viewed and evaluated these images (plain radiographs) as part of my medical decision making, as well as reviewing the written report by the radiologist.  ED MD interpretation: Mildly prominent fluid-filled small bowel loops in the pelvis and right pelvis with mild adjacent mesenteric edema and free fluid suspicious for enteritis likely infectious or inflammatory.  Radiologist states possibly of ischemia was considered however unlikely given patency of the mesenteric vessels.  Official radiology report(s): CT ABDOMEN PELVIS W CONTRAST  Result Date: 08/14/2019 CLINICAL DATA:  86 year old with right lower quadrant pain. EXAM: CT ABDOMEN AND PELVIS WITH CONTRAST TECHNIQUE: Multidetector CT imaging of the abdomen and pelvis was performed using the standard protocol following bolus administration  of intravenous contrast. CONTRAST:  161mL OMNIPAQUE IOHEXOL 300 MG/ML  SOLN COMPARISON:  Abdominal CT 07/04/2013. Abdominal MRI 07/21/2013 FINDINGS: Lower chest: Small bilateral pleural effusions with adjacent atelectasis. Mild cardiomegaly. Coronary artery calcifications. No pericardial effusion. Hepatobiliary: Small hepatic cysts, stable from prior exam. Gallbladder physiologically distended, no calcified stone. No biliary dilatation. Pancreas: Mild parenchymal atrophy. No ductal dilatation or inflammation. 2.2 cm lesion in the pancreatic tail is isodense to adjacent splenic parenchyma, likely splenule, unchanged from prior exam. Spleen: Normal in size without focal abnormality. Adrenals/Urinary Tract: Mild adrenal thickening without dominant adrenal nodule. No hydronephrosis or perinephric edema. Homogeneous renal enhancement with symmetric excretion on delayed phase imaging. Hyperdense left renal cyst measures 2.9 cm, slightly increased from 2.4 cm on 2014 exam. Additional small renal lesions are too small to accurately characterize. Calcification in the lower left kidney is likely parenchymal.  Possible nonobstructing right renal stone. Urinary bladder is partially distended without wall thickening. Stomach/Bowel: Small hiatal hernia. Fluid-filled mildly dilated stomach without gastric wall thickening. Proximal small bowel are unremarkable. Moderate to long length of small bowel loops in the pelvis lower abdomen and right pelvis are fluid-filled and slightly prominent, with mild adjacent mesenteric edema and small volume free fluid. No obstruction. No terminal ileal inflammation. No appendicitis. Diminutive appendix tentatively visualized curling adjacent to the cecum. Liquid stool in the cecum and ascending colon. No colonic wall thickening. Small volume of stool in the remainder the colon. Sigmoid colonic tortuosity. No significant diverticular disease. Vascular/Lymphatic: Infrarenal aortic aneurysm maximal dimension 3.7 cm. Diffuse calcified and noncalcified atheromatous plaque of the abdominal aorta and its branches. Proximal portal vein is patent. The central mesenteric vessels are patent. No enlarged lymph nodes in the abdomen or pelvis. Reproductive: Prominent prostate gland spanning 5.3 cm with central calcifications. Other: Small amount of free fluid in the right lower quadrant tracks into the pelvis. Small amount of right upper quadrant ascites adjacent to the liver. No organized abscess. No free air. Fat within both inguinal canals, left greater than right. Musculoskeletal: Multilevel degenerative change throughout the lumbar spine. Advanced right hip osteoarthritis. There are no acute or suspicious osseous abnormalities. IMPRESSION: 1. Mildly prominent fluid-filled small bowel loops in the pelvis and right pelvis with mild adjacent mesenteric edema and free fluid. Findings suspicious for enteritis, likely infectious or inflammatory. Possibility of ischemia is considered, however felt less likely given the length of bowel involvement and patency of mesenteric vessels. 2. Infrarenal aortic  aneurysm maximal dimension 3.7 cm. Recommend followup by ultrasound in 2 years. This recommendation follows ACR consensus guidelines: White Paper of the ACR Incidental Findings Committee II on Vascular Findings. J Am Coll Radiol 2013; 10:789-794. Aortic aneurysm NOS (ICD10-I71.9) 3. Small bilateral pleural effusions with adjacent atelectasis. Mild cardiomegaly. 4. Additional chronic findings as described. Aortic Atherosclerosis (ICD10-I70.0). Electronically Signed   By: Keith Rake M.D.   On: 08/14/2019 02:19    ____________________________________________      Procedures   ____________________________________________   INITIAL IMPRESSION / MDM / [place] / ED COURSE  As part of my medical decision making, I reviewed the following data within the [place] NUMBER   86 year old male presented with above-stated history and physical exam with differential diagnosis including but not limited to enteritis colitis diverticulitis versus other potential intra-abdominal pathology.  A such CT scan of the abdomen was performed which revealed evidence of enteritis.  Patient also noted to be in atrial fibrillation with rapid ventricular response in the emergency department and as  such IV metoprolol 5 mg was administered with rate control.  Patient discussed with Dr. Sidney Ace for hospital admission further evaluation and management.  ____________________________________________  FINAL CLINICAL IMPRESSION(S) / ED DIAGNOSES  Final diagnoses:  Acute kidney injury ([place])  Enteritis  Atrial fibrillation with rapid ventricular response ([place])     MEDICATIONS GIVEN DURING THIS VISIT:  Medications  morphine 2 MG/ML injection 2 mg (has no administration in time range)  ondansetron (ZOFRAN) injection 4 mg (4 mg Intravenous Given 08/13/19 1812)  sodium chloride 0.9 % bolus 1,000 mL (1,000 mLs Intravenous New Bag/Given 08/14/19 0116)  ondansetron (ZOFRAN) injection 4 mg (4 mg  Intravenous Given 08/14/19 0113)  iohexol (OMNIPAQUE) 300 MG/ML solution 100 mL (100 mLs Intravenous Contrast Given 08/14/19 0153)     ED Discharge Orders    None      *Please note:  Clinton Holmes was evaluated in Emergency Department on 08/14/2019 for the symptoms described in the history of present illness. He was evaluated in the context of the global COVID-19 pandemic, which necessitated consideration that the patient might be at risk for infection with the SARS-CoV-2 virus that causes COVID-19. Institutional protocols and algorithms that pertain to the evaluation of patients at risk for COVID-19 are in a state of rapid change based on information released by regulatory bodies including the CDC and federal and state organizations. These policies and algorithms were followed during the patient's care in the ED.  Some ED evaluations and interventions may be delayed as a result of limited staffing during the pandemic.*  Note:  This document was prepared using Dragon voice recognition software and may include unintentional dictation errors.   Gregor Hams, MD 08/14/19 0535    Gregor Hams, MD 08/14/19 [id number]    Gregor Hams, MD 08/14/19 [phone]

## 2019-08-14 NOTE — ED Notes (Signed)
Floor unable to take pt until tele box available

## 2019-08-14 NOTE — ED Notes (Signed)
Pt oriented but appears confused.  Can answer year, place etc but thinks family is outside room and keeps asking who people are and then will think at home even though said was at [facility].

## 2019-08-14 NOTE — Progress Notes (Signed)
ANTICOAGULATION CONSULT NOTE - Initial Consult  Pharmacy Consult for apixaban Indication: atrial fibrillation  No Known Allergies  Patient Measurements: Height: 6' (182.9 cm) Weight: 241 lb (109.3 kg) IBW/kg (Calculated) : 77.6   Vital Signs: Temp: 97.9 F (36.6 C) (01/01 0732) Temp Source: Oral (01/01 0732) BP: 117/96 (01/01 0732) Pulse Rate: 94 (01/01 0732)  Labs: Recent Labs    08/13/19 1809 08/13/19 2209  HGB 13.9  --   HCT 44.6  --   PLT 177  --   CREATININE 1.76*  --   TROPONINIHS 19* 19*    Estimated Creatinine Clearance: 36.3 mL/min (A) (by C-G formula based on SCr of 1.76 mg/dL (H)).  Assessment: 86 yo male here with AFib with RVR transitioning from heparin drip to apixaban.   Goal of Therapy:  Monitor platelets by anticoagulation protocol: Yes   Plan:  Heparin drip already d/c'd  Apixaban 2.5 mg BID (age >10, SCr 1.76) Will need Scr and CBC at least every three days per policy  Pharmacy will continue to follow.   Rocky Morel 08/14/2019,8:52 AM

## 2019-08-14 NOTE — Progress Notes (Signed)
ANTICOAGULATION CONSULT NOTE - Initial Consult  Pharmacy Consult for heparin Indication: atrial fibrillation  No Known Allergies  Patient Measurements: Height: 6' (182.9 cm) Weight: 241 lb (109.3 kg) IBW/kg (Calculated) : 77.6 Heparin Dosing Weight: 90 kg  Vital Signs: Temp: 98 F (36.7 C) (12/31 2210) Temp Source: Oral (12/31 2210) BP: 114/84 (01/01 0600) Pulse Rate: 84 (01/01 0500)  Labs: Recent Labs    08/13/19 1809 08/13/19 2209  HGB 13.9  --   HCT 44.6  --   PLT 177  --   CREATININE 1.76*  --   TROPONINIHS 19* 19*    Estimated Creatinine Clearance: 36.3 mL/min (A) (by C-G formula based on SCr of 1.76 mg/dL (H)).   Medical History: Past Medical History:  Diagnosis Date  . CAD (coronary artery disease)    L&RHC 12/23/2014 elevated LVEDP, prox LAD 45%, 60% ramus  . CKD (chronic kidney disease), stage III   . Essential hypertension   . Murmur   . OA (osteoarthritis)    a. 2011 s/p bilat TKA.  . Ventricular bigeminy    a. 12/2014. Started on amio on 12/24/2014    Medications:  Scheduled:  . clopidogrel  75 mg Oral Daily  . heparin  4,000 Units Intravenous Once  . metoprolol succinate  50 mg Oral Daily  .  morphine injection  2 mg Intravenous Once  . multivitamin with minerals   Oral Daily    Assessment: Patient arrives to ED w/ h/o CAD, CKD< HTN, murmur, and bigeminy c/o abdominal pain showing mesenteric edema and suspicious for enteritis infection vs. Inflammatory also possibility of ischemia and infrarenal aortic aneurysm. EKG showing afib w/ RVR (appears to be new onset afib). No anticoagulation on file. CHADS-VASc = 4 (HTN, age > 14, CAD)  Patient is being started on heparin drip for anticoagulation of new onset afib.  Goal of Therapy:  Heparin level 0.3-0.7 units/ml Monitor platelets by anticoagulation protocol: Yes   Plan:  Will bolus heparin 4000 units IV x 1 Will start rate at 1150 units/hr and check anti-Xa at 1400. Baseline aPTT, INR  pending, CBC WNL. Will continue to monitor daily CBC's and adjust per anti-Xa levels.  Tobie Lords, PharmD, BCPS Clinical Pharmacist 08/14/2019,6:25 AM

## 2019-08-14 NOTE — Progress Notes (Signed)
*  PRELIMINARY RESULTS* Echocardiogram 2D Echocardiogram has been performed.  Clinton Holmes 08/14/2019, 12:02 PM

## 2019-08-14 NOTE — H&P (Signed)
[facility] [place] NAME: Clinton Holmes    MR#:  [id number]  DATE OF BIRTH:  [birth year]  DATE OF ADMISSION:  08/14/2019  PRIMARY CARE PHYSICIAN: Philmore Pali, NP   REQUESTING/REFERRING PHYSICIAN: Marjean Donna, MD CHIEF COMPLAINT:   Chief Complaint  Patient presents with  . Abdominal Pain    HISTORY OF PRESENT ILLNESS:  Clinton Holmes  is a 86 y.o. Caucasian male with a known history of coronary artery disease, stage III chronic kidney disease, hypertension and osteoporosis presenting with acute onset of abdominal pain mainly in the left lower quadrant with associated recurrent vomiting including once in the lobby.  He denied any diarrhea.  No bilious vomitus or hematemesis.  No fever or chills.  No headache or dizziness or blurred vision.  No chest pain or dyspnea or palpitations.  No cough or wheezing.  No report of recent antibiotic treatment.  Upon presentation to the emergency room, vital signs were within normal.  Labs revealed BUN of 30 and creatinine 1.76 compared to 32 and 1.47 on 10/30/2018.  High-sensitivity troponin I was 19 twice.  EKG showed atrial fibrillation with rapid ventricular response with a rate of 117 with Q waves anteroseptally and inferiorly.  Abdominal pelvic CT scan was remarkable for signs of enteritis likely infectious or inflammatory and less likely ischemic given the length of the bowel involvement and patency of mesenteric vessels.  Also showed infrarenal aortic aneurysm with diameter of 3.7 cm with recommendation for ultrasound in 2 years.  It also showed small bilateral pleural effusions with adjacent atelectasis and mild cardiomegaly.  The patient was given 5 mg of IV Lopressor, 2 mg of IV morphine sulfate, 4 mg of IV Zofran twice and 1 L bolus of IV normal saline.  The patient will be admitted to medical monitored bed for further evaluation and management.  PAST MEDICAL HISTORY:   Past Medical History:  Diagnosis Date  . CAD  (coronary artery disease)    L&RHC 12/23/2014 elevated LVEDP, prox LAD 45%, 60% ramus  . CKD (chronic kidney disease), stage III   . Essential hypertension   . Murmur   . OA (osteoarthritis)    a. 2011 s/p bilat TKA.  . Ventricular bigeminy    a. 12/2014. Started on amio on 12/24/2014    PAST SURGICAL HISTORY:   Past Surgical History:  Procedure Laterality Date  . CARDIAC CATHETERIZATION N/A 12/23/2014   Procedure: Right/Left Heart Cath and Coronary Angiography;  Surgeon: Belva Crome, MD;  Location: [facility];  Service: Cardiovascular;  Laterality: N/A;  . KNEE SURGERY    . SHOULDER SURGERY Left   . TONSILLECTOMY      SOCIAL HISTORY:   Social History   Tobacco Use  . Smoking status: Former Research scientist (life sciences)  . Smokeless tobacco: Never Used  . Tobacco comment: Smoked for a few yrs in the service.  Quit in 1968. Never a heavy smoker.  Substance Use Topics  . Alcohol use: No    Alcohol/week: 0.0 standard drinks    FAMILY HISTORY:   Family History  Problem Relation Age of Onset  . Heart attack Father        deceased @ 13.  . Congestive Heart Failure Sister        alive in her late 45's.  . Other Brother        alive @ 68.  Marland Kitchen CAD Mother        H/o CABG, deceased @  85.    DRUG ALLERGIES:  No Known Allergies  REVIEW OF SYSTEMS:   ROS As per history of present illness. All pertinent systems were reviewed above. Constitutional,  HEENT, cardiovascular, respiratory, GI, GU, musculoskeletal, neuro, psychiatric, endocrine,  integumentary and hematologic systems were reviewed and are otherwise  negative/unremarkable except for positive findings mentioned above in the HPI.   MEDICATIONS AT HOME:   Prior to Admission medications   Medication Sig Start Date End Date Taking? Authorizing Provider  clopidogrel (PLAVIX) 75 MG tablet Take 1 tablet (75 mg total) by mouth daily. 11/25/17  Yes Jerline Pain, MD  furosemide (LASIX) 20 MG tablet Take 1 tablet (20 mg total) by mouth  daily. 07/26/18 08/14/19 Yes Dustin Flock, MD  lisinopril (PRINIVIL,ZESTRIL) 40 MG tablet Take 1 tablet by mouth daily.   Yes [provider]  metoprolol succinate (TOPROL-XL) 50 MG 24 hr tablet Take 1 tablet (50 mg total) by mouth daily. Take with or immediately following a meal. 11/06/18  Yes Wellington Hampshire, MD  Multiple Vitamins-Minerals (MULTIVITAMIN ADULTS 50+ PO) Take 1 tablet by mouth daily.   Yes [provider]      VITAL SIGNS:  Blood pressure 105/78, pulse (!) 111, temperature 98 F (36.7 C), temperature source Oral, resp. rate 18, height 6' (1.829 m), weight 109.3 kg, SpO2 100 %.  PHYSICAL EXAMINATION:  Physical Exam  GENERAL:  86 y.o.-year-old Caucasian male patient lying in the bed with no acute distress.  EYES: Pupils equal, round, reactive to light and accommodation. No scleral icterus. Extraocular muscles intact.  HEENT: Head atraumatic, normocephalic. Oropharynx and nasopharynx clear.  NECK:  Supple, no jugular venous distention. No thyroid enlargement, no tenderness.  LUNGS: Normal breath sounds bilaterally, no wheezing, rales,rhonchi or crepitation. No use of accessory muscles of respiration.  CARDIOVASCULAR: Regular rate and rhythm, S1, S2 normal. No murmurs, rubs, or gallops.  ABDOMEN: Soft, nondistended with mild left lower quadrant tenderness without rebound tenderness guarding or rigidity.  Bowel sounds present. No organomegaly or mass.  EXTREMITIES: No pedal edema, cyanosis, or clubbing.  NEUROLOGIC: Cranial nerves II through XII are intact. Muscle strength 5/5 in all extremities. Sensation intact. Gait not checked.  PSYCHIATRIC: The patient is alert and oriented x 3.  Normal affect and good eye contact. SKIN: No obvious rash, lesion, or ulcer.   LABORATORY PANEL:   CBC Recent Labs  Lab 08/13/19 1809  WBC 8.8  HGB 13.9  HCT 44.6  PLT 177    ------------------------------------------------------------------------------------------------------------------  Chemistries  Recent Labs  Lab 08/13/19 1809  NA 141  K 4.3  CL 103  CO2 25  GLUCOSE 133*  BUN 30*  CREATININE 1.76*  CALCIUM 10.0  AST 30  ALT 20  ALKPHOS 61  BILITOT 1.2   ------------------------------------------------------------------------------------------------------------------  Cardiac Enzymes No results for input(s): TROPONINI in the last 168 hours. ------------------------------------------------------------------------------------------------------------------  RADIOLOGY:  CT ABDOMEN PELVIS W CONTRAST  Result Date: 08/14/2019 CLINICAL DATA:  86 year old with right lower quadrant pain. EXAM: CT ABDOMEN AND PELVIS WITH CONTRAST TECHNIQUE: Multidetector CT imaging of the abdomen and pelvis was performed using the standard protocol following bolus administration of intravenous contrast. CONTRAST:  177mL OMNIPAQUE IOHEXOL 300 MG/ML  SOLN COMPARISON:  Abdominal CT 07/04/2013. Abdominal MRI 07/21/2013 FINDINGS: Lower chest: Small bilateral pleural effusions with adjacent atelectasis. Mild cardiomegaly. Coronary artery calcifications. No pericardial effusion. Hepatobiliary: Small hepatic cysts, stable from prior exam. Gallbladder physiologically distended, no calcified stone. No biliary dilatation. Pancreas: Mild parenchymal atrophy. No ductal dilatation or inflammation.  2.2 cm lesion in the pancreatic tail is isodense to adjacent splenic parenchyma, likely splenule, unchanged from prior exam. Spleen: Normal in size without focal abnormality. Adrenals/Urinary Tract: Mild adrenal thickening without dominant adrenal nodule. No hydronephrosis or perinephric edema. Homogeneous renal enhancement with symmetric excretion on delayed phase imaging. Hyperdense left renal cyst measures 2.9 cm, slightly increased from 2.4 cm on 2014 exam. Additional small renal lesions are too  small to accurately characterize. Calcification in the lower left kidney is likely parenchymal. Possible nonobstructing right renal stone. Urinary bladder is partially distended without wall thickening. Stomach/Bowel: Small hiatal hernia. Fluid-filled mildly dilated stomach without gastric wall thickening. Proximal small bowel are unremarkable. Moderate to long length of small bowel loops in the pelvis lower abdomen and right pelvis are fluid-filled and slightly prominent, with mild adjacent mesenteric edema and small volume free fluid. No obstruction. No terminal ileal inflammation. No appendicitis. Diminutive appendix tentatively visualized curling adjacent to the cecum. Liquid stool in the cecum and ascending colon. No colonic wall thickening. Small volume of stool in the remainder the colon. Sigmoid colonic tortuosity. No significant diverticular disease. Vascular/Lymphatic: Infrarenal aortic aneurysm maximal dimension 3.7 cm. Diffuse calcified and noncalcified atheromatous plaque of the abdominal aorta and its branches. Proximal portal vein is patent. The central mesenteric vessels are patent. No enlarged lymph nodes in the abdomen or pelvis. Reproductive: Prominent prostate gland spanning 5.3 cm with central calcifications. Other: Small amount of free fluid in the right lower quadrant tracks into the pelvis. Small amount of right upper quadrant ascites adjacent to the liver. No organized abscess. No free air. Fat within both inguinal canals, left greater than right. Musculoskeletal: Multilevel degenerative change throughout the lumbar spine. Advanced right hip osteoarthritis. There are no acute or suspicious osseous abnormalities. IMPRESSION: 1. Mildly prominent fluid-filled small bowel loops in the pelvis and right pelvis with mild adjacent mesenteric edema and free fluid. Findings suspicious for enteritis, likely infectious or inflammatory. Possibility of ischemia is considered, however felt less likely  given the length of bowel involvement and patency of mesenteric vessels. 2. Infrarenal aortic aneurysm maximal dimension 3.7 cm. Recommend followup by ultrasound in 2 years. This recommendation follows ACR consensus guidelines: White Paper of the ACR Incidental Findings Committee II on Vascular Findings. J Am Coll Radiol 2013; 10:789-794. Aortic aneurysm NOS (ICD10-I71.9) 3. Small bilateral pleural effusions with adjacent atelectasis. Mild cardiomegaly. 4. Additional chronic findings as described. Aortic Atherosclerosis (ICD10-I70.0). Electronically Signed   By: Keith Rake M.D.   On: 08/14/2019 02:19      IMPRESSION AND PLAN:   1.  Abdominal pain with acute enteritis.  Differential diagnosis include infectious versus inflammatory and less likely ischemic.  The patient will be admitted to a medical monitored bed.  He will be hydrated with IV normal saline.  Will follow stool studies ordered in the emergency room.  2.  Atrial fibrillation with rapid ventricular response.  The patient will be continued on Toprol-XL.  He received 5 mg of IV Lopressor in the ER and heart rate will be monitored.  2D echo and a cardiology consult will be obtained.  The patient will be placed on IV heparin for now for the possibility of embolic and ischemic etiology for his enteritis.  3.  Acute kidney injury: Stage IIIb chronic kidney disease.  The patient will be placed on hydration with IV normal saline.  We will follow BMPs.  4.  Hypertension.  We will continue Toprol-XL as mentioned above.  5.  Coronary artery disease.  We will continue Toprol-XL as well as Plavix.   6.  DVT prophylaxis.  The patient will be placed on IV heparin for now.   All the records are reviewed and case discussed with ED provider. The plan of care was discussed in details with the patient (and family). I answered all questions. The patient agreed to proceed with the above mentioned plan. Further management will depend upon hospital  course.   CODE STATUS: Full code  TOTAL TIME TAKING CARE OF THIS PATIENT: 55 minutes.    Christel Mormon M.D on 08/14/2019 at 4:31 AM  [facility]   From 7 PM-7 AM, contact night-coverage [email or web address]  CC: Primary care physician; Philmore Pali, NP   Note: This dictation was prepared with Dragon dictation along with smaller phrase technology. Any transcriptional errors that result from this process are unintentional.

## 2019-08-14 NOTE — ED Notes (Signed)
Updated daughter Clinton Holmes

## 2019-08-15 DIAGNOSIS — I5042 Chronic combined systolic (congestive) and diastolic (congestive) heart failure: Secondary | ICD-10-CM

## 2019-08-15 DIAGNOSIS — R2681 Unsteadiness on feet: Secondary | ICD-10-CM

## 2019-08-15 LAB — CBC
HCT: 41.3 % (ref 39.0–52.0)
Hemoglobin: 12.6 g/dL — ABNORMAL LOW (ref 13.0–17.0)
MCH: 28.1 pg (ref 26.0–34.0)
MCHC: 30.5 g/dL (ref 30.0–36.0)
MCV: 92.2 fL (ref 80.0–100.0)
Platelets: 157 10*3/uL (ref 150–400)
RBC: 4.48 MIL/uL (ref 4.22–5.81)
RDW: 15.2 % (ref 11.5–15.5)
WBC: 7.6 10*3/uL (ref 4.0–10.5)
nRBC: 0 % (ref 0.0–0.2)

## 2019-08-15 LAB — BASIC METABOLIC PANEL
Anion gap: 9 (ref 5–15)
BUN: 28 mg/dL — ABNORMAL HIGH (ref 8–23)
CO2: 26 mmol/L (ref 22–32)
Calcium: 9.5 mg/dL (ref 8.9–10.3)
Chloride: 105 mmol/L (ref 98–111)
Creatinine, Ser: 1.59 mg/dL — ABNORMAL HIGH (ref 0.61–1.24)
GFR calc Af Amer: 44 mL/min — ABNORMAL LOW (ref >60)
GFR calc non Af Amer: 38 mL/min — ABNORMAL LOW (ref >60)
Glucose, Bld: 93 mg/dL (ref 70–99)
Potassium: 4.4 mmol/L (ref 3.5–5.1)
Sodium: 140 mmol/L (ref 135–145)

## 2019-08-15 MED ORDER — METOPROLOL SUCCINATE ER 50 MG PO TB24
100.0000 mg | ORAL_TABLET | Freq: Every day | ORAL | Status: DC
  Administered 2019-08-16: 100 mg via ORAL
  Filled 2019-08-15: qty 2

## 2019-08-15 MED ORDER — METOPROLOL SUCCINATE ER 50 MG PO TB24
50.0000 mg | ORAL_TABLET | Freq: Once | ORAL | Status: AC
  Administered 2019-08-15: 50 mg via ORAL
  Filled 2019-08-15: qty 1

## 2019-08-15 NOTE — Progress Notes (Addendum)
PROGRESS NOTE  Clinton Holmes [id number] DOB: July 25, 1928 DOA: 08/14/2019 PCP: Philmore Pali, NP  Brief History   86 year old man PMH CKD stage III, CAD presenting with acute abdominal pain left lower quadrant, vomiting without diarrhea.  CT suggestive of enteritis.  Admitted for acute abdominal pain with acute enteritis, atrial fibrillation with rapid ventricular response.  A & P  Acute enteritis with associated abdominal pain and vomiting, differential infectious, inflammatory, less likely ischemic based on patency of mesenteric vessels. --Afebrile, vital signs stable, no hypoxia or hypotension --This appears to be resolved.  Atrial fibrillation with rapid ventricular response.  Possibly new diagnosis, not previously noted in cardiology notes, EKG from 10/30/2018 is equivocal either sinus rhythm with frequent ectopy or intermittent atrial fibrillation.  Echocardiogram as below. CHA2DS2-VASc 7. --Apixaban started. --check TSH --Rate poorly controlled, over 100, will give a second dose of Toprol-XL now and increase dose tomorrow.  Reported blood with bowel movement.  None here.  Patient reports for some time now he has had a little bit of blood with hard bowel movements.  He is not aware of hemorrhoids in the past.  He has not seen a GI doctor. --External exam reveals external hemorrhoids.  Rectal exam is unremarkable.  Lab will not send stool card to examiner's I was unable to perform guaiac myself.  However order has been placed for stool guaiac.  I do not suspect significant bleeding.  Gait instability at home, uses a walker, no falls but reports unsteadiness. --We will ask PT to evaluate, but to assess for home needs as well as to help better gauge risk/benefit of anticoagulation in this 86 year old man.  He does well with therapy, would lean towards anticoagulation at discharge.  I do not think he has significant bleeding.  Sounds like hemorrhoids and exam supports that.  Chronic combined  systolic, diastolic CHF secondary to NICM followed by cardiology [facility], sec to HTN heart disease, aortic stenosis and PVCs.  Echocardiogram this admission LVEF 25-30%, global hypokinesis.  Ventricular diastolic parameters left ventricle indeterminate.  Mildly reduced RV function systolic.  Echocardiogram December 2019: LVEF [id number], grade 1 diastolic dysfunction, moderate to severe aortic stenosis.  Could not comment on wall motion abnormalities. --Appears to be well compensated.  Continue furosemide, lisinopril, metoprolol succinate.   Severe aortic stenosis seen on echocardiogram 08/14/2019 and 07/25/2018. --Asymptomatic.  Follow-up as an outpatient.  CAD, last cath 2016 widely patent coronary arteries, mild global left ventricular hypokinesis  AKI superimposed on CKD stage IIIb --Appears to be at baseline now.  Abdominal aortic aneurysm 3.7 cm --Outpatient ultrasound in 2 years  PMH stroke w/ left eye impact --Continue statin.  Plavix stopped as the patient is now on anticoagulation.  Aortic atherosclerosis, asymptomatic --Continue statin  6 mm right lower lobe nodular opacity seen on chest CT 10/2018 during chart review.  Recommend outpatient follow-up with consideration given to CAT scan in the next 3 months, by March.  Discussion.  Patient appears well but his heart rate is not well controlled yet.  Will increase Toprol as above and monitor.  Enteritis seems to have resolved.  He was started on anticoagulation which I think at this point is quite reasonable, he appears a lot younger than stated age and does appear to be robust.  However he has gait instability and therefore PT has been consulted.  If he appears to be a high fall risk with PT, then would reconsider whether anticoagulation is worth the risk.  Will monitor overnight, would anticipate  discharge 1/30 if heart rate is controlled.  Resolved Hospital Problem list       DVT prophylaxis: apixaban Code Status: Full Family  Communication: none Disposition Plan: hom    Murray Hodgkins, MD  [facility] Direct contact: see [email or web address] (further directions at bottom of note if needed) 7PM-7AM contact night coverage as at bottom of note 08/15/2019, 3:48 PM  LOS: 1 day   Significant Hospital Events   . 1/1 admitted for abdominal pain, vomiting, atrial fibrillation with rapid ventricular response.  CT showed enteritis.   Consults:  .    Procedures:  .   Significant Diagnostic Tests:  . 12/31 EKG atrial fibrillation with rapid ventricular response, LVH with repolarization abnormalities.  Anteroseptal MI, old.  No significant change compared to last test 10/2018 . 1/1 CT abdomen pelvis: Multiple fluid-filled small bowel loops with mesenteric edema suspicious for enteritis, likely infectious or inflammatory.  Ischemia not felt to be likely. . 1/1 echocardiogram LVEF 25-30%.  Left ventricular diastolic parameters indeterminate, mildly reduced systolic function   Micro Data:  . Point-of-care SARS Covid  antigen negative.  No confirmatory test ordered. . C. difficile negative   Antimicrobials:  .   Interval History/Subjective  No nausea or vomiting or abdominal pain.  Tolerating liquids.  Seems to be breathing okay.  Reports bleeding with hard bowel movements.  Does not seem to be a lot of blood.  This problems been going on for some time now but has been too embarrassed to talk about it.  Reports his balance is poor at home, uses a walker but has not had any recent falls.  Objective   Vitals:  Vitals:   08/15/19 1458 08/15/19 1541  BP: 117/83   Pulse: (!) 106 82  Resp:  17  Temp:  (!) 97.5 F (36.4 C)  SpO2:  95%    Exam:  Constitutional.  Appears calm, comfortable.  Appears 37 years younger than stated age. Respiratory.  Clear to auscultation bilaterally.  No wheezes, rales or rhonchi.  Normal respiratory effort. Cardiovascular.  Regular rate and rhythm.  No murmur, rub or gallop.  No lower  extremity edema. Psychiatric.  Grossly normal mood and affect.  Speech fluent and appropriate. GU.  External hemorrhoids visualized.  Anus appears unremarkable.  Rectal exam unremarkable.  Stool brown.  I have personally reviewed the following:   Today's Data  . Creatinine stable, appears to be at baseline, 1.59 . CBC unremarkable  Scheduled Meds: . apixaban  2.5 mg Oral BID  . aspirin EC  81 mg Oral Daily  . [START ON 08/16/2019] metoprolol succinate  100 mg Oral Daily  .  morphine injection  2 mg Intravenous Once  . multivitamin with minerals   Oral Daily   Continuous Infusions:   Active Problems:   CKD (chronic kidney disease), stage III   Aortic stenosis   CHF (congestive heart failure) (HCC)   Acute kidney injury (AKI) with acute tubular necrosis (ATN) (HCC)   Enteritis   Gait instability   LOS: 1 day   How to contact the [facility] Attending or Consulting provider 7A - 7P or covering provider during after hours [place], for this patient?  1. Check the care team in [facility] and look for a) attending/consulting [facility] provider listed and b) the [facility] team listed 2. Log into [email or web address] and use [place]'s universal password to access. If you do not have the password, please contact the hospital operator. 3. Locate the [facility] provider you are looking  for under [facility] and page to a number that you can be directly reached. 4. If you still have difficulty reaching the provider, please page the [facility] (Director on Call) for the Hospitalists listed on amion for assistance.   Time spent greater than 35 minutes, greater than 50% counseling, coordination of care.

## 2019-08-15 NOTE — Progress Notes (Deleted)
    Office Visit    Patient Name: Clinton Holmes Date of Encounter: 08/15/2019  Primary Care Provider:  Philmore Pali, NP Primary Cardiologist:  Candee Furbish, MD  Chief Complaint    *** May not make it. Admit to hospital 1/1 with AKI  Past Medical History    Past Medical History:  Diagnosis Date  . CAD (coronary artery disease)    L&RHC 12/23/2014 elevated LVEDP, prox LAD 45%, 60% ramus  . CKD (chronic kidney disease), stage III   . Essential hypertension   . Murmur   . OA (osteoarthritis)    a. 2011 s/p bilat TKA.  . Ventricular bigeminy    a. 12/2014. Started on amio on 12/24/2014   Past Surgical History:  Procedure Laterality Date  . CARDIAC CATHETERIZATION N/A 12/23/2014   Procedure: Right/Left Heart Cath and Coronary Angiography;  Surgeon: Belva Crome, MD;  Location: [facility];  Service: Cardiovascular;  Laterality: N/A;  . KNEE SURGERY    . SHOULDER SURGERY Left   . TONSILLECTOMY      Allergies  No Known Allergies  History of Present Illness    ***  Home Medications    Prior to Admission medications   Medication Sig Start Date End Date Taking? Authorizing Provider  clopidogrel (PLAVIX) 75 MG tablet Take 1 tablet (75 mg total) by mouth daily. 11/25/17   Jerline Pain, MD  furosemide (LASIX) 20 MG tablet Take 1 tablet (20 mg total) by mouth daily. 07/26/18 08/14/19  Dustin Flock, MD  lisinopril (PRINIVIL,ZESTRIL) 40 MG tablet Take 1 tablet by mouth daily.    [provider]  metoprolol succinate (TOPROL-XL) 50 MG 24 hr tablet Take 1 tablet (50 mg total) by mouth daily. Take with or immediately following a meal. 11/06/18   Wellington Hampshire, MD  Multiple Vitamins-Minerals (MULTIVITAMIN ADULTS 50+ PO) Take 1 tablet by mouth daily.    [provider]    Review of Systems    ***.  All other systems reviewed and are otherwise negative except as noted above.  Physical Exam    VS:  There were no vitals taken for this visit. , BMI There  is no height or weight on file to calculate BMI. GEN: Well nourished, well developed, in no acute distress. HEENT: normal. Neck: Supple, no JVD, carotid bruits, or masses. Cardiac: RRR, no murmurs, rubs, or gallops. No clubbing, cyanosis, edema.  Radials/DP/PT 2+ and equal bilaterally.  Respiratory:  Respirations regular and unlabored, clear to auscultation bilaterally. GI: Soft, nontender, nondistended, BS + x 4. MS: no deformity or atrophy. Skin: warm and dry, no rash. Neuro:  Strength and sensation are intact. Psych: Normal affect.  Accessory Clinical Findings    ECG personally reviewed by me today - *** - no acute changes.  There were no vitals filed for this visit.   Assessment & Plan    1.  ***   Arvil Chaco, PA-C 08/15/2019, 3:39 PM

## 2019-08-15 NOTE — Plan of Care (Signed)
Patient denies pain or discomfort.  Reminded to call for staff assistance when wanting to get out of bed, bed alarm activated.  Call bell in reach. Will continue to monitor.

## 2019-08-16 ENCOUNTER — Inpatient Hospital Stay: Payer: Medicare Other

## 2019-08-16 DIAGNOSIS — I4891 Unspecified atrial fibrillation: Secondary | ICD-10-CM

## 2019-08-16 DIAGNOSIS — R109 Unspecified abdominal pain: Secondary | ICD-10-CM | POA: Diagnosis present

## 2019-08-16 DIAGNOSIS — N179 Acute kidney failure, unspecified: Secondary | ICD-10-CM

## 2019-08-16 LAB — CBC
HCT: 40.9 % (ref 39.0–52.0)
Hemoglobin: 13.6 g/dL (ref 13.0–17.0)
MCH: 29.2 pg (ref 26.0–34.0)
MCHC: 33.3 g/dL (ref 30.0–36.0)
MCV: 87.8 fL (ref 80.0–100.0)
Platelets: 181 10*3/uL (ref 150–400)
RBC: 4.66 MIL/uL (ref 4.22–5.81)
RDW: 15.3 % (ref 11.5–15.5)
WBC: 11.5 10*3/uL — ABNORMAL HIGH (ref 4.0–10.5)
nRBC: 0 % (ref 0.0–0.2)

## 2019-08-16 LAB — BASIC METABOLIC PANEL
Anion gap: 13 (ref 5–15)
BUN: 34 mg/dL — ABNORMAL HIGH (ref 8–23)
CO2: 19 mmol/L — ABNORMAL LOW (ref 22–32)
Calcium: 9.4 mg/dL (ref 8.9–10.3)
Chloride: 107 mmol/L (ref 98–111)
Creatinine, Ser: 2.09 mg/dL — ABNORMAL HIGH (ref 0.61–1.24)
GFR calc Af Amer: 32 mL/min — ABNORMAL LOW (ref >60)
GFR calc non Af Amer: 27 mL/min — ABNORMAL LOW (ref >60)
Glucose, Bld: 188 mg/dL — ABNORMAL HIGH (ref 70–99)
Potassium: 4.8 mmol/L (ref 3.5–5.1)
Sodium: 139 mmol/L (ref 135–145)

## 2019-08-16 LAB — GLUCOSE, CAPILLARY: Glucose-Capillary: 161 mg/dL — ABNORMAL HIGH (ref 70–99)

## 2019-08-16 LAB — MAGNESIUM: Magnesium: 2.4 mg/dL (ref 1.7–2.4)

## 2019-08-16 LAB — TSH: TSH: 0.802 u[IU]/mL (ref 0.350–4.500)

## 2019-08-16 LAB — BRAIN NATRIURETIC PEPTIDE: B Natriuretic Peptide: 2308 pg/mL — ABNORMAL HIGH (ref 0.0–100.0)

## 2019-08-16 MED ORDER — FUROSEMIDE 10 MG/ML IJ SOLN
INTRAMUSCULAR | Status: AC
  Administered 2019-08-16: 80 mg via INTRAVENOUS
  Filled 2019-08-16: qty 8

## 2019-08-16 MED ORDER — DILTIAZEM HCL 30 MG PO TABS: 30.0000 mg | ORAL_TABLET | Freq: Two times a day (BID) | ORAL | Status: DC

## 2019-08-16 MED ORDER — FUROSEMIDE 10 MG/ML IJ SOLN: 80.0000 mg | Freq: Once | INTRAMUSCULAR | Status: AC

## 2019-08-16 MED ORDER — METOPROLOL SUCCINATE ER 50 MG PO TB24: 150.0000 mg | ORAL_TABLET | Freq: Every day | ORAL | Status: DC

## 2019-08-16 MED ORDER — METOPROLOL SUCCINATE ER 50 MG PO TB24
50.0000 mg | ORAL_TABLET | Freq: Once | ORAL | Status: AC
  Administered 2019-08-16: 50 mg via ORAL
  Filled 2019-08-16: qty 1

## 2019-08-16 NOTE — Significant Event (Signed)
Rapid Response Event Note  Overview: Time Called: 2000 Arrival Time: 2001 Event Type: Respiratory  Initial Focused Assessment: called to room for pt in acute respiratory distress, tachypniec, diaphoretic, and HR 130s afib. 94% on room air. BP WNL. Pt is alert and oriented, no complaints of chest pain, complaints of shortness of breath and feeling hot.  Interventions:  Sharion Settler, NP at bedside to assess patient. 2LNC placed on pt for comfort. Labs ordered, chest xray ordered, ekg ordered, and lasix 80mg  ordered.  Plan of Care (if not transferred): continue to monitor after giving lasix.  Call if further assistance needed.  Event Summary: Name of Physician Notified: Sharion Settler, NP at 2000    at    Outcome: Stayed in room and stabalized  Event End Time: 2015  Zettie Pho

## 2019-08-16 NOTE — Progress Notes (Signed)
PROGRESS NOTE  Clinton Holmes [id number] DOB: Oct 24, 1927 DOA: 08/14/2019 PCP: Philmore Pali, NP  Brief History   86 year old man PMH CKD stage III, CAD presenting with acute abdominal pain left lower quadrant, vomiting without diarrhea.  CT suggestive of enteritis.  Admitted for acute abdominal pain with acute enteritis, atrial fibrillation with rapid ventricular response.  A & P  Acute enteritis with associated abdominal pain and vomiting, differential infectious, inflammatory, less likely ischemic based on patency of mesenteric vessels. --Afebrile, vital signs stable, no hypoxia or hypotension --This appears to be resolved.  Atrial fibrillation with rapid ventricular response: Still labile with heart rate goes over 110 -  possibly new diagnosis, not previously noted in cardiology notes, EKG from 10/30/2018 is equivocal either sinus rhythm with frequent ectopy or intermittent atrial fibrillation.  Echocardiogram as below. CHA2DS2-VASc 7. --Apixaban started. --check TSH --0.802 --Rate remains poorly controlled; over 100 despite giving second dose of Toprol-XL 100 mg this morning  -May further increase Toprol-XL dose to 150 mg in the a.m. -Tinea to monitor heart rate and blood pressure  Reported blood with bowel movement.  None here.  Patient reports for some time now he has had a little bit of blood with hard bowel movements.  He is not aware of hemorrhoids in the past.  He has not seen a GI doctor. --External exam reveals external hemorrhoids.  Rectal exam is unremarkable.  Lab will not send stool card to examiner's I was unable to perform guaiac myself.  However order has been placed for stool guaiac.  I do not suspect significant bleeding.  Gait instability at home, uses a walker, no falls but reports unsteadiness. --PT evaluated patient this afternoon and recommended patient could be discharged home once when ready with home health care PT and aide -Consulted case manager  Chronic  combined systolic, diastolic CHF secondary to NICM followed by cardiology [facility], sec to HTN heart disease, aortic stenosis and PVCs. -Remains stable/euvolemic -  echocardiogram this admission LVEF 25-30%, global hypokinesis.  Ventricular diastolic parameters left ventricle indeterminate.  Mildly reduced RV function systolic.  Echocardiogram December 2019: LVEF [id number], grade 1 diastolic dysfunction, moderate to severe aortic stenosis.  Could not comment on wall motion abnormalities. --Appears to be well compensated.   - Continue furosemide, lisinopril, metoprolol succinate.   Severe aortic stenosis seen on echocardiogram 08/14/2019 and 07/25/2018. --Asymptomatic.  Follow-up as an outpatient. -Tinea home medication  CAD, last cath 2016 widely patent coronary arteries, mild global left ventricular hypokinesis  AKI superimposed on CKD stage IIIb --Appears to be at baseline now.  Abdominal aortic aneurysm 3.7 cm --Outpatient ultrasound in 2 years  PMH stroke w/ left eye impact --Continue statin.  Plavix stopped as the patient is now on anticoagulation.  Aortic atherosclerosis, asymptomatic --Continue statin  6 mm right lower lobe nodular opacity seen on chest CT 10/2018 during chart review.  Recommend outpatient follow-up with consideration given to CAT scan in the next 3 months, by March.  Discussion.  Patient appears well but his heart rate is not well controlled yet.  Will increase Toprol as above and monitor.  Enteritis seems to have resolved.  He was started on anticoagulation which I think at this point is quite reasonable, he appears a lot younger than stated age and does appear to be robust.  However he has gait instability and therefore PT has been consulted.  PT recommended patient could be discharged home with home health PT and aide.  Case manager consulted for management.  Will monitor overnight for heart rate and blood pressure being unstable, would anticipate discharge 1/30 if heart  rate is controlled.  Resolved Hospital Problem list     DVT prophylaxis: apixaban Code Status: Full Family Communication: none Disposition Plan: hom    Thornell Mule, MD  [facility] Direct contact: see [email or web address] (further directions at bottom of note if needed) 7PM-7AM contact night coverage as at bottom of note 08/16/2019, 4:46 PM  LOS: 2 days   Significant Hospital Events   . 1/1 admitted for abdominal pain, vomiting, atrial fibrillation with rapid ventricular response.  CT showed enteritis.   Consults:  .    Procedures:  .   Significant Diagnostic Tests:  . 12/31 EKG atrial fibrillation with rapid ventricular response, LVH with repolarization abnormalities.  Anteroseptal MI, old.  No significant change compared to last test 10/2018 . 1/1 CT abdomen pelvis: Multiple fluid-filled small bowel loops with mesenteric edema suspicious for enteritis, likely infectious or inflammatory.  Ischemia not felt to be likely. . 1/1 echocardiogram LVEF 25-30%.  Left ventricular diastolic parameters indeterminate, mildly reduced systolic function   Micro Data:  . Point-of-care SARS Covid  antigen negative.  No confirmatory test ordered. . C. difficile negative   Antimicrobials:  .   Interval History/Subjective  Lying on bed this morning.  Appears to be tired.  States physical physical therapy did not visit him as of this morning.  Reports his balance is poor at home, uses a walker but has not had any recent falls.  Later in the afternoon patient worked with physical therapy and noticed to have balance issue.  Objective   Vitals:  Vitals:   08/16/19 1424 08/16/19 1501  BP: (!) 140/100 (!) 120/100  Pulse:  (!) 114  Resp:    Temp:    SpO2:      Exam:  Constitutional.  Appears calm, comfortable.  Appears 20 years younger than stated age. Respiratory.  Clear to auscultation bilaterally.  No wheezes, rales or rhonchi.  Normal respiratory effort. Cardiovascular.  Regular rate  and rhythm.  No murmur, rub or gallop.  No lower extremity edema. Psychiatric.  Grossly normal mood and affect.  Speech fluent and appropriate. GU.  External hemorrhoids visualized.  Anus appears unremarkable.  Rectal exam unremarkable.  Stool brown.  I have personally reviewed the following:   Today's Data  . Creatinine stable, appears to be at baseline, 1.59 . CBC unremarkable  Scheduled Meds: . apixaban  2.5 mg Oral BID  . aspirin EC  81 mg Oral Daily  . [START ON 08/17/2019] metoprolol succinate  150 mg Oral Daily  . multivitamin with minerals   Oral Daily   Continuous Infusions:   Active Problems:   CKD (chronic kidney disease), stage III   Aortic stenosis   CHF (congestive heart failure) (HCC)   Acute kidney injury (AKI) with acute tubular necrosis (ATN) (HCC)   Enteritis   Gait instability   LOS: 2 days   How to contact the [facility] Attending or Consulting provider 7A - 7P or covering provider during after hours [place], for this patient?  1. Check the care team in [facility] and look for a) attending/consulting [facility] provider listed and b) the [facility] team listed 2. Log into [email or web address] and use [place]'s universal password to access. If you do not have the password, please contact the hospital operator. 3. Locate the [facility] provider you are looking for under [facility] and page to a number that you can be directly  reached. 4. If you still have difficulty reaching the provider, please page the [facility] (Director on Call) for the Hospitalists listed on amion for assistance.   Time spent greater than 35 minutes, greater than 50% counseling, coordination of care.

## 2019-08-16 NOTE — Evaluation (Signed)
Physical Therapy Evaluation Patient Details Name: Clinton Holmes MRN: [id number] DOB: 01/19/1928 Today's Date: 08/16/2019   History of Present Illness  86 year old male with PMH of CKD, CAD, HTN, murmur, OA and ventricular bigeminy presented to ED on 08/14/2019 with complaints of abdominal pain, nausea, vomiting and loose stools.  A CT scan of the abdomen was performed which revealed evidence of enteritis.  Patient also noted to be in atrial fibrillation with rapid ventricular response in the emergency department and as such IV metoprolol 5 mg was administered with rate control.   Clinical Impression  Pt is a pleasant 86 year old male who was admitted for AKI. Pt performs bed mobility with min A, transfers with min A, and ambulation with min A. Pt performs supine>sit requiring min A for HHA to achieve upright trunk secondary to shortness of breath; educated to utilize log roll technique (supine>sidelying>sit) with pt somewhat receptive though still requesting HHA during bed mobility. Pt performs STS with min A for initial lift off requiring cuing for setup to scoot to edge of bed and push off from surface. Pt ambulates 52' with RW requiring min A, demonstrating poor safety awareness, requiring cuing to decrease pace and for RW management though is not receptive to cuing. Pt is limited in session today with gait and functional mobility secondary to shortness of breath; education provided for energy conservation strategies and pursed lip breathing with pt stating he is aware of these strategies already. Also discussed use of rollator so that he may sit as needed. Pt demonstrates deficits with activity tolerance, strength and balance requiring continued skilled PT intervention to improve functional mobility. Of note, BP assessed following gait to be 140/100; RN notified.     Follow Up Recommendations Home health PT;Supervision for mobility/OOB    Equipment Recommendations  Rolling walker with 5" wheels     Recommendations for Other Services       Precautions / Restrictions Precautions Precautions: Fall Restrictions Weight Bearing Restrictions: No      Mobility  Bed Mobility Overal bed mobility: Needs Assistance Bed Mobility: Supine to Sit     Supine to sit: Min assist Sit to supine: supervision     General bed mobility comments: requiring HHA to achieve upright trunk; with second attempt, educated to utilize log rolling/sidelying technique but pt still requiring HHA due to SOB  Transfers Overall transfer level: Needs assistance Equipment used: Rolling walker (2 wheeled) Transfers: Sit to/from Stand Sit to Stand: Min assist         General transfer comment: Requiring cuing to scoot to edge of bed and push up from surface and multiple attempts initially to fully stand secondary to weakness  Ambulation/Gait Ambulation/Gait assistance: Min assist Gait Distance (Feet): 75 Feet Assistive device: Rolling walker (2 wheeled) Gait Pattern/deviations: WFL(Within Functional Limits)     General Gait Details: Pt with poor safety awareness and unsafe management of RW, with limited improvement with cuing as pt is not receptive and is impulsive  Financial trader Rankin (Stroke Patients Only)       Balance Overall balance assessment: Needs assistance Sitting-balance support: No upper extremity supported Sitting balance-Leahy Scale: Good Sitting balance - Comments: able to sit steady EOB   Standing balance support: No upper extremity supported Standing balance-Leahy Scale: Good Standing balance comment: Able to stand and briefly march without UE support  Pertinent Vitals/Pain Pain Assessment: No/denies pain    Home Living Family/patient expects to be discharged to:: Private residence Living Arrangements: Alone Available Help at Discharge: Family;Available PRN/intermittently Type of Home:  House Home Access: Stairs to enter Entrance Stairs-Rails: None Entrance Stairs-Number of Steps: 2 Home Layout: One level Home Equipment: Walker - 2 wheels;Shower seat;Cane - single point Additional Comments: Daughters live nearby and visit frequently. Per pt, daughter would be able to stay with him 24/7 if needed.    Prior Function Level of Independence: Independent with assistive device(s)         Comments: Primarily household ambulation (limiting community ambulation) since March due to [place] concerns; uses 2ww     Hand Dominance        Extremity/Trunk Assessment   Upper Extremity Assessment Upper Extremity Assessment: Overall WFL for tasks assessed    Lower Extremity Assessment Lower Extremity Assessment: Overall WFL for tasks assessed(history of B TKAs)       Communication   Communication: HOH(R > L)  Cognition Arousal/Alertness: Awake/alert Behavior During Therapy: WFL for tasks assessed/performed Overall Cognitive Status: Within Functional Limits for tasks assessed                                        General Comments      Exercises     Assessment/Plan    PT Assessment Patient needs continued PT services  PT Problem List Decreased strength;Decreased mobility;Decreased safety awareness;Decreased activity tolerance;Decreased balance       PT Treatment Interventions DME instruction;Therapeutic exercise;Gait training;Balance training;Stair training;Neuromuscular re-education;Functional mobility training;Therapeutic activities;Patient/family education    PT Goals (Current goals can be found in the Care Plan section)  Acute Rehab PT Goals Patient Stated Goal: to get out of here/go home PT Goal Formulation: With patient Time For Goal Achievement: 08/30/19 Potential to Achieve Goals: Good    Frequency Min 2X/week   Barriers to discharge        Co-evaluation               AM-PAC PT "6 Clicks" Mobility  Outcome Measure Help  needed turning from your back to your side while in a flat bed without using bedrails?: A Little Help needed moving from lying on your back to sitting on the side of a flat bed without using bedrails?: A Little Help needed moving to and from a bed to a chair (including a wheelchair)?: A Little Help needed standing up from a chair using your arms (e.g., wheelchair or bedside chair)?: A Little Help needed to walk in hospital room?: A Little Help needed climbing 3-5 steps with a railing? : A Lot 6 Click Score: 17    End of Session Equipment Utilized During Treatment: Gait belt Activity Tolerance: Patient limited by fatigue Patient left: in bed;with call bell/phone within reach;with bed alarm set Nurse Communication: Mobility status PT Visit Diagnosis: Unsteadiness on feet (R26.81);History of falling (Z91.81);Muscle weakness (generalized) (M62.81)    Time: 1333-1410 PT Time Calculation (min) (ACUTE ONLY): 37 min   Charges:   PT Evaluation $PT Eval Moderate Complexity: 1 Mod PT Treatments $Gait Training: 8-22 mins        Petra Kuba, PT, DPT 08/16/19, 2:43 PM

## 2019-08-17 DIAGNOSIS — K529 Noninfective gastroenteritis and colitis, unspecified: Secondary | ICD-10-CM

## 2019-08-17 DIAGNOSIS — I4891 Unspecified atrial fibrillation: Secondary | ICD-10-CM

## 2019-08-17 DIAGNOSIS — I5020 Unspecified systolic (congestive) heart failure: Secondary | ICD-10-CM

## 2019-08-17 DIAGNOSIS — I350 Nonrheumatic aortic (valve) stenosis: Secondary | ICD-10-CM

## 2019-08-17 DIAGNOSIS — R0603 Acute respiratory distress: Secondary | ICD-10-CM

## 2019-08-17 LAB — BASIC METABOLIC PANEL
Anion gap: 13 (ref 5–15)
BUN: 38 mg/dL — ABNORMAL HIGH (ref 8–23)
CO2: 21 mmol/L — ABNORMAL LOW (ref 22–32)
Calcium: 9.7 mg/dL (ref 8.9–10.3)
Chloride: 104 mmol/L (ref 98–111)
Creatinine, Ser: 1.98 mg/dL — ABNORMAL HIGH (ref 0.61–1.24)
GFR calc Af Amer: 34 mL/min — ABNORMAL LOW (ref >60)
GFR calc non Af Amer: 29 mL/min — ABNORMAL LOW (ref >60)
Glucose, Bld: 119 mg/dL — ABNORMAL HIGH (ref 70–99)
Potassium: 4.8 mmol/L (ref 3.5–5.1)
Sodium: 138 mmol/L (ref 135–145)

## 2019-08-17 LAB — CBC WITH DIFFERENTIAL/PLATELET
Abs Immature Granulocytes: 0.07 10*3/uL (ref 0.00–0.07)
Basophils Absolute: 0 10*3/uL (ref 0.0–0.1)
Basophils Relative: 0 %
Eosinophils Absolute: 0 10*3/uL (ref 0.0–0.5)
Eosinophils Relative: 0 %
HCT: 43.5 % (ref 39.0–52.0)
Hemoglobin: 13.5 g/dL (ref 13.0–17.0)
Immature Granulocytes: 1 %
Lymphocytes Relative: 10 %
Lymphs Abs: 1.1 10*3/uL (ref 0.7–4.0)
MCH: 28.2 pg (ref 26.0–34.0)
MCHC: 31 g/dL (ref 30.0–36.0)
MCV: 90.8 fL (ref 80.0–100.0)
Monocytes Absolute: 0.7 10*3/uL (ref 0.1–1.0)
Monocytes Relative: 7 %
Neutro Abs: 8.9 10*3/uL — ABNORMAL HIGH (ref 1.7–7.7)
Neutrophils Relative %: 82 %
Platelets: 171 10*3/uL (ref 150–400)
RBC: 4.79 MIL/uL (ref 4.22–5.81)
RDW: 15.2 % (ref 11.5–15.5)
WBC: 10.8 10*3/uL — ABNORMAL HIGH (ref 4.0–10.5)
nRBC: 0 % (ref 0.0–0.2)

## 2019-08-17 LAB — GI PATHOGEN PANEL BY PCR, STOOL

## 2019-08-17 LAB — SARS CORONAVIRUS 2 (TAT 6-24 HRS): SARS Coronavirus 2: NEGATIVE

## 2019-08-17 MED ORDER — METOPROLOL SUCCINATE ER 50 MG PO TB24
100.0000 mg | ORAL_TABLET | Freq: Two times a day (BID) | ORAL | Status: DC
  Administered 2019-08-17 – 2019-08-20 (×6): 100 mg via ORAL
  Filled 2019-08-17 (×9): qty 2

## 2019-08-17 NOTE — Progress Notes (Signed)
Clinton Holmes for apixaban Indication: atrial fibrillation  No Known Allergies  Patient Measurements: Height: 6' (182.9 cm) Weight: 241 lb (109.3 kg) IBW/kg (Calculated) : 77.6   Vital Signs: Temp: 98.9 F (37.2 C) (01/04 0829) BP: 134/102 (01/04 0829) Pulse Rate: 110 (01/04 0829)  Labs: Recent Labs    08/15/19 0520 08/16/19 2008 08/17/19 0753  HGB 12.6* 13.6 13.5  HCT 41.3 40.9 43.5  PLT 157 181 171  CREATININE 1.59* 2.09* 1.98*    Estimated Creatinine Clearance: 32.3 mL/min (A) (by C-G formula based on SCr of 1.98 mg/dL (H)).  Assessment: 86 yo male here with AFib with RVR transitioning from heparin drip to apixaban.   Goal of Therapy:  Monitor platelets by anticoagulation protocol: Yes   Plan:  Apixaban 2.5 mg BID (age >55, SCr 1.98) Will need Scr and CBC at least every three days per policy  Pharmacy will continue to follow.   Kitiara Hintze A 08/17/2019,11:07 AM

## 2019-08-17 NOTE — Progress Notes (Signed)
Physical Therapy Treatment Patient Details Name: Clinton Holmes MRN: [id number] DOB: 06/11/1928 Today's Date: 08/17/2019    History of Present Illness 86 year old male with PMH of CKD, CAD, HTN, murmur, OA and ventricular bigeminy presented to ED on 08/13/2018 with complaints of abdominal pain, nausea, vomiting and loose stools.  A CT scan of the abdomen was performed which revealed evidence of enteritis.  Patient also noted to be in atrial fibrillation with rapid ventricular response in the emergency department and as such IV metoprolol 5 mg was administered with rate control.    PT Comments    Pt is making good progress towards goals with ability to improve mobility on RA this date. Does demonstrate impulsive nature with decreased safety awareness. Able to perform there-ex safely. Will continue to progress as able.   Follow Up Recommendations  Home health PT;Supervision/Assistance - 24 hour     Equipment Recommendations  Rolling walker with 5" wheels    Recommendations for Other Services       Precautions / Restrictions Precautions Precautions: Fall Restrictions Weight Bearing Restrictions: No    Mobility  Bed Mobility Overal bed mobility: Needs Assistance Bed Mobility: Supine to Sit     Supine to sit: Min assist     General bed mobility comments: needs cues for transition to EOB. Once seated able to sit with upright posture.  Transfers Overall transfer level: Needs assistance Equipment used: Rolling walker (2 wheeled) Transfers: Sit to/from Stand Sit to Stand: Min assist         General transfer comment: upright posture noted once standing. IMpulsive with cues for correction.   Ambulation/Gait Ambulation/Gait assistance: Min assist Gait Distance (Feet): 50 Feet Assistive device: Rolling walker (2 wheeled) Gait Pattern/deviations: Step-through pattern     General Gait Details: ambulated in room with poor safety awareness. Pt fatigues quickly with exertion  although O2 sats WNL on RA.   Stairs             Wheelchair Mobility    Modified Rankin (Stroke Patients Only)       Balance Overall balance assessment: Needs assistance Sitting-balance support: No upper extremity supported Sitting balance-Leahy Scale: Good Sitting balance - Comments: able to sit steady EOB   Standing balance support: No upper extremity supported Standing balance-Leahy Scale: Fair                              Cognition Arousal/Alertness: Awake/alert Behavior During Therapy: WFL for tasks assessed/performed Overall Cognitive Status: Within Functional Limits for tasks assessed                                        Exercises Other Exercises Other Exercises: supine ther-ex performed on B LE including AP, quad sets, SLRs, hip abd/add, and LAQ. All ther-ex performed x 10 reps with cga.    General Comments        Pertinent Vitals/Pain Pain Assessment: No/denies pain    Home Living                      Prior Function            PT Goals (current goals can now be found in the care plan section) Acute Rehab PT Goals Patient Stated Goal: to get out of here/go home PT Goal Formulation: With patient Time For Goal Achievement:  08/30/19 Potential to Achieve Goals: Good Progress towards PT goals: Progressing toward goals    Frequency    Min 2X/week      PT Plan Current plan remains appropriate    Co-evaluation              AM-PAC PT "6 Clicks" Mobility   Outcome Measure  Help needed turning from your back to your side while in a flat bed without using bedrails?: A Little Help needed moving from lying on your back to sitting on the side of a flat bed without using bedrails?: A Little Help needed moving to and from a bed to a chair (including a wheelchair)?: A Little Help needed standing up from a chair using your arms (e.g., wheelchair or bedside chair)?: A Little Help needed to walk in hospital  room?: A Little Help needed climbing 3-5 steps with a railing? : A Lot 6 Click Score: 17    End of Session Equipment Utilized During Treatment: Gait belt Activity Tolerance: Patient limited by fatigue Patient left: in chair;with chair alarm set Nurse Communication: Mobility status PT Visit Diagnosis: Unsteadiness on feet (R26.81);History of falling (Z91.81);Muscle weakness (generalized) (M62.81)     Time: [id number] PT Time Calculation (min) (ACUTE ONLY): 40 min  Charges:  $Gait Training: 23-37 mins $Therapeutic Exercise: 8-22 mins                     Greggory Stallion, PT, DPT [phone]    Sherill Mangen 08/17/2019, 5:14 PM

## 2019-08-17 NOTE — Consult Note (Addendum)
Cardiology Consultation:   Patient ID: AADON ROUTHIER; [id number]; March 05, 1928   Admit date: 08/14/2019 Date of Consult: 08/17/2019  Primary Care Provider: Philmore Pali, NP Primary Cardiologist: Fletcher Anon Primary Electrophysiologist:  Caryl Comes   Patient Profile:   Clinton Holmes is a 86 y.o. male with a hx of mild nonobstructive CAD by [place] in 2016, HFrEF secondary to NICM, moderate to severe aortic stenosis, pulmonary hypertension, possible TIA/CVA with left visual changes, acoustic neuroma s/p gamma knife procedure at [facility] in 1990s, syncope, CKD stage III, frequent PVCs, HTN, and HLD who is being seen today for the evaluation of Afib with RVR at the request of Dr. Sidney Ace.  History of Present Illness:   Mr. Austria underwent echo in 12/2014 showed an EF of 55 to 60%, moderate concentric LVH, normal wall motion, moderate aortic stenosis, trivial aortic insufficiency, mild to moderate mitral regurgitation directed eccentrically and posteriorly, severely dilated left atrium, mildly dilated right ventricle, PASP 39 mmHg.  In the setting of the patient's aortic stenosis, he underwent right and left cardiac cath in 12/2014 that showed widely patent coronary arteries with 45% proximal LAD stenosis.  The ramus intermedius branches and more medial/anterior branch contained 60% stenosis.  There was no hemodynamically significant coronary artery disease identified.  There was no evidence of aortic stenosis.  He did have mild pulmonary hypertension.  He has been medically managed.  In 07/2015 he had a left eye visual defect leading to the stopping of his amiodarone.  He was seen by neurology and started on Plavix and Lipitor for potential stroke causing visual field defect. He was admitted in 07/2018 with acute hypoxic respiratory distress felt to be secondary to malignant hypertension, acute on chronic combined systolic and diastolic CHF, frequent PVCs occasionally in a pattern of ventricular bigeminy, and aortic valve  stenosis. Echo during that admission showed an EF of 45 to 50%, mild concentric LVH, grade 1 diastolic dysfunction, moderate to severe aortic stenosis with a mean gradient of 26 mmHg and a valve area of 0.76 cm, mildly dilated left atrium. Follow up outpatient cardiac monitoring showed sinus rhythm, frequent PVCs totaling 31,721 beats representing at 35% burden, as well as NSVT. He was referred to EP and given lack of symptoms, no treatment was advised.   He was admitted to [facility] on 08/13/2019 with acute enteritis felt to be infectious vs inflammatory and less likely ischemic based on patency of mesenteric vessels. Upon admission, he was found to be in new onset Afib with RVR of uncertain duration. His Plavix was stopped (on for CVA) and he was placed on Eliquis. His ventricular rates have been suboptimally controlled in the low 100s to 120s bpm leading to the escalation of his Toprol. TSH was normal. Potassium has remained at goal during his admission. Magnesium at goal. His lisinopril and Lasix have been held in the setting of acute on CKD stage III. Echo on 08/14/2019 showed an EF of 25-30%, mild LVH, global HK, mildly reduced RVSF and normal RV cavity size, mildly dilated LA, severe AS, moderately elevated PASP. Overnight, heading into 1/4, he was tachypneic with accessory muscle usage. He was given IV Lasix 80 mg x 1 with improvement in SOB. Cardiology is asked to evaluate for Afib with RVR. His abdominal pain has resolved. With this, his ventricular rates are improving at rest into the 90s to low 100s bpm. With movement in the bed or room, his ventricular rates continue to trend into the 120s bpm. He  denies any chest pain, abdominal distension, orthopnea, or lower extremity swelling. He is unable to feel any change in his baseline palpitations that have been associated with known PVCs.    Past Medical History:  Diagnosis Date  . CAD (coronary artery disease)    L&RHC 12/23/2014 elevated LVEDP, prox LAD  45%, 60% ramus  . CKD (chronic kidney disease), stage III   . Essential hypertension   . Murmur   . OA (osteoarthritis)    a. 2011 s/p bilat TKA.  . Ventricular bigeminy    a. 12/2014. Started on amio on 12/24/2014    Past Surgical History:  Procedure Laterality Date  . CARDIAC CATHETERIZATION N/A 12/23/2014   Procedure: Right/Left Heart Cath and Coronary Angiography;  Surgeon: Belva Crome, MD;  Location: [place] [facility];  Service: Cardiovascular;  Laterality: N/A;  . KNEE SURGERY    . SHOULDER SURGERY Left   . TONSILLECTOMY       Home Meds: Prior to Admission medications   Medication Sig Start Date End Date Taking? Authorizing Provider  clopidogrel (PLAVIX) 75 MG tablet Take 1 tablet (75 mg total) by mouth daily. 11/25/17  Yes Jerline Pain, MD  furosemide (LASIX) 20 MG tablet Take 1 tablet (20 mg total) by mouth daily. 07/26/18 08/14/19 Yes Dustin Flock, MD  lisinopril (PRINIVIL,ZESTRIL) 40 MG tablet Take 1 tablet by mouth daily.   Yes [provider]  metoprolol succinate (TOPROL-XL) 50 MG 24 hr tablet Take 1 tablet (50 mg total) by mouth daily. Take with or immediately following a meal. 11/06/18  Yes Wellington Hampshire, MD  Multiple Vitamins-Minerals (MULTIVITAMIN ADULTS 50+ PO) Take 1 tablet by mouth daily.   Yes [provider]    Inpatient Medications: Scheduled Meds: . apixaban  2.5 mg Oral BID  . aspirin EC  81 mg Oral Daily  . metoprolol succinate  150 mg Oral Daily  . multivitamin with minerals   Oral Daily   Continuous Infusions:  PRN Meds: acetaminophen **OR** acetaminophen, magnesium hydroxide, ondansetron **OR** ondansetron (ZOFRAN) IV, traZODone  Allergies:  No Known Allergies  Social History:   Social History   Socioeconomic History  . Marital status: Single    Spouse name: Not on file  . Number of children: Not on file  . Years of education: Not on file  . Highest education level: Not on file  Occupational History  . Not on  file  Tobacco Use  . Smoking status: Former Research scientist (life sciences)  . Smokeless tobacco: Never Used  . Tobacco comment: Smoked for a few yrs in the service.  Quit in 1968. Never a heavy smoker.  Substance and Sexual Activity  . Alcohol use: No    Alcohol/week: 0.0 standard drinks  . Drug use: No  . Sexual activity: Not on file  Other Topics Concern  . Not on file  Social History Narrative   Lives between [place] and [place] by himself.  He does not routinely exercise.   Social Determinants of Health   Financial Resource Strain:   . Difficulty of Paying Living Expenses: Not on file  Food Insecurity:   . Worried About Charity fundraiser in the Last Year: Not on file  . Ran Out of Food in the Last Year: Not on file  Transportation Needs:   . Lack of Transportation (Medical): Not on file  . Lack of Transportation (Non-Medical): Not on file  Physical Activity:   . Days of Exercise per Week: Not on file  .  Minutes of Exercise per Session: Not on file  Stress:   . Feeling of Stress : Not on file  Social Connections:   . Frequency of Communication with Friends and Family: Not on file  . Frequency of Social Gatherings with Friends and Family: Not on file  . Attends Religious Services: Not on file  . Active Member of Clubs or Organizations: Not on file  . Attends Archivist Meetings: Not on file  . Marital Status: Not on file  Intimate Partner Violence:   . Fear of Current or Ex-Partner: Not on file  . Emotionally Abused: Not on file  . Physically Abused: Not on file  . Sexually Abused: Not on file     Family History:   Family History  Problem Relation Age of Onset  . Heart attack Father        deceased @ 77.  . Congestive Heart Failure Sister        alive in her late 59's.  . Other Brother        alive @ 12.  Marland Kitchen CAD Mother        H/o CABG, deceased @ 49.    ROS:  Review of Systems  Constitutional: Positive for malaise/fatigue. Negative for chills, diaphoresis, fever and  weight loss.  HENT: Negative for congestion.   Eyes: Negative for discharge and redness.  Respiratory: Positive for shortness of breath. Negative for cough, hemoptysis, sputum production and wheezing.   Cardiovascular: Positive for palpitations. Negative for chest pain, orthopnea, claudication, leg swelling and PND.  Gastrointestinal: Positive for abdominal pain, blood in stool, nausea and vomiting. Negative for constipation, diarrhea, heartburn and melena.  Genitourinary: Negative for hematuria.  Musculoskeletal: Negative for falls and myalgias.  Skin: Negative for rash.  Neurological: Positive for weakness. Negative for dizziness, tingling, tremors, sensory change, speech change, focal weakness and loss of consciousness.  Endo/Heme/Allergies: Does not bruise/bleed easily.  Psychiatric/Behavioral: Negative for substance abuse. The patient is not nervous/anxious.   All other systems reviewed and are negative.     Physical Exam/Data:   Vitals:   08/16/19 2035 08/16/19 2258 08/16/19 2259 08/17/19 0349  BP:  117/90  (!) 130/99  Pulse: 99 88  100  Resp:  18  20  Temp:  97.8 F (36.6 C)    TempSrc:      SpO2:  90% 98% 98%  Weight:      Height:        Intake/Output Summary (Last 24 hours) at 08/17/2019 0828 Last data filed at 08/17/2019 0437 Gross per 24 hour  Intake 720 ml  Output 550 ml  Net 170 ml   Filed Weights   08/13/19 1800  Weight: 109.3 kg   Body mass index is 32.69 kg/m.   Physical Exam: General: Well developed, well nourished, in no acute distress. Head: Normocephalic, atraumatic, sclera non-icteric, no xanthomas, nares without discharge.  Neck: Negative for carotid bruits. JVD not elevated. Lungs: Diminished breath sounds bilaterally. Breathing is unlabored. Heart: Irregularly irregular with S1 S2. II/VI systolic murmur RUSB, no rubs, or gallops appreciated. Abdomen: Soft, non-tender, non-distended with normoactive bowel sounds. No hepatomegaly. No  rebound/guarding. No obvious abdominal masses. Msk:  Strength and tone appear normal for age. Extremities: No clubbing or cyanosis. No edema. Distal pedal pulses are 2+ and equal bilaterally. Neuro: Alert and oriented X 3. No facial asymmetry. No focal deficit. Moves all extremities spontaneously. Psych:  Responds to questions appropriately with a normal affect.   EKG:  The EKG  was personally reviewed and demonstrates: 08/13/2019 - Afib with RVR, 117 bpm, nonspecific IVCD, nonspecific st/t changes. 08/16/2019 - Afib with RVR, 108 bpm, nonspecific IVCD, occasional PVCs, nonspecific st/t changes. 08/17/2019 - Afib with RVR, 108 bpm, nonspecific IVCD, occasional PVCs Telemetry:  Telemetry was personally reviewed and demonstrates: Afib with ventricular rates ranging from the 90s to 120s bpm, occasional PVCs  Weights: Filed Weights   08/13/19 1800  Weight: 109.3 kg    Relevant CV Studies: 2D Echo 08/14/2019: 1. Left ventricular ejection fraction, by visual estimation, is 25 to 30%. The left ventricle has severely decreased function. There is mildly increased left ventricular hypertrophy.  2. Left ventricular diastolic parameters are indeterminate.  3. The left ventricle demonstrates global hypokinesis.  4. Global right ventricle has mildly reduced systolic function.The right ventricular size is normal. No increase in right ventricular wall thickness.  5. Left atrial size was mildly dilated.  6. Right atrial size was normal.  7. The mitral valve is grossly normal. Trivial mitral valve regurgitation.  8. The tricuspid valve is normal in structure.  9. Aortic valve area, by VTI measures 0.65 cm. 10. Aortic valve mean gradient measures 22.0 mmHg. 11. Aortic valve peak gradient measures 41.5 mmHg. 12. The aortic valve is abnormal. Aortic valve regurgitation is not visualized. Severe aortic valve stenosis. 13. The pulmonic valve was not well visualized. Pulmonic valve regurgitation is not  visualized. 14. Moderately elevated pulmonary artery systolic pressure. 15. The inferior vena cava is dilated in size with <50% respiratory variability, suggesting right atrial pressure of 15 mmHg. __________  24-hour Holter 07/2018: Normal sinus rhythm,  Avg rate 64 bpm  Rare PAC (<1%)  Frequent Ventricular beats (35%), total of 31, 721  23K isolated beats 1,134 couplets 13,950 bigeminal beats NSVT, longest run 26 beats, 1,037 runs total (5,740 beats)   Laboratory Data:  Chemistry Recent Labs  Lab 08/13/19 1809 08/15/19 0520 08/16/19 2008  NA 141 140 139  K 4.3 4.4 4.8  CL 103 105 107  CO2 25 26 19*  GLUCOSE 133* 93 188*  BUN 30* 28* 34*  CREATININE 1.76* 1.59* 2.09*  CALCIUM 10.0 9.5 9.4  GFRNONAA 34* 38* 27*  GFRAA 39* 44* 32*  ANIONGAP 13 9 13     Recent Labs  Lab 08/13/19 1809  PROT 7.4  ALBUMIN 4.7  AST 30  ALT 20  ALKPHOS 61  BILITOT 1.2   Hematology Recent Labs  Lab 08/13/19 1809 08/15/19 0520 08/16/19 2008  WBC 8.8 7.6 11.5*  RBC 4.95 4.48 4.66  HGB 13.9 12.6* 13.6  HCT 44.6 41.3 40.9  MCV 90.1 92.2 87.8  MCH 28.1 28.1 29.2  MCHC 31.2 30.5 33.3  RDW 15.1 15.2 15.3  PLT 177 157 181   Cardiac EnzymesNo results for input(s): TROPONINI in the last 168 hours. No results for input(s): TROPIPOC in the last 168 hours.  BNP Recent Labs  Lab 08/16/19 2007  BNP 2,308.0*    DDimer No results for input(s): DDIMER in the last 168 hours.  Radiology/Studies:  CT ABDOMEN PELVIS W CONTRAST  Result Date: 08/14/2019 IMPRESSION: 1. Mildly prominent fluid-filled small bowel loops in the pelvis and right pelvis with mild adjacent mesenteric edema and free fluid. Findings suspicious for enteritis, likely infectious or inflammatory. Possibility of ischemia is considered, however felt less likely given the length of bowel involvement and patency of mesenteric vessels. 2. Infrarenal aortic aneurysm maximal dimension 3.7 cm. Recommend followup by ultrasound in  2 years. This recommendation follows ACR consensus  guidelines: White Paper of the ACR Incidental Findings Committee II on Vascular Findings. J Am Coll Radiol 2013; 10:789-794. Aortic aneurysm NOS (ICD10-I71.9) 3. Small bilateral pleural effusions with adjacent atelectasis. Mild cardiomegaly. 4. Additional chronic findings as described. Aortic Atherosclerosis (ICD10-I70.0). Electronically Signed   By: Keith Rake M.D.   On: 08/14/2019 02:19   DG Chest Port 1 View  Result Date: 08/16/2019 IMPRESSION: Cardiomegaly with mild, diffuse interstitial pulmonary opacity and small, layering bilateral pleural effusions, likely edema. Atypical/viral infection is a differential consideration. Electronically Signed   By: Eddie Candle M.D.   On: 08/16/2019 20:35     Assessment and Plan:   1. New onset Afib with RVR: -Likely in the setting of acute enteritis  -Ventricular rates are improving as his abdominal pain has resolved -At rest, his ventricular rates are in the 90s to low 100s bpm -With ambulation, his rates are in the 120s bpm  -Increase Toprol XL to 100 mg bid -Continue Eliquis 2.5 mg bid (age and SCr) -Hopefully, as his enteritis continues to improve, his ventricular rates will continue to improve -If ventricular rates are well controlled with increased dose of Toprol, plan for DCCV in 3-4 weeks if he remains in Afib, if ventricular rates remain suboptimally controlled, he may require TEE-guided DCCV prior to discharge -Would ideally like to defer DCCV until after his acute enteritis is resolved -He is asymptomatic  -CHADS2VASc at least 7 (CHF, HTN, age x 2, TIA/CVA x 2, vascular disease)  2. Elevated troponin/nonobstructive CAD: -Minimally elevated at 19 with an unchanged delta -Not consistent with ACS -Likely supply demand ischemia in the setting of acute enteritis and acute on CKD -Prior cath with nonobstructive disease -Plan for outpatient ischemic evaluation   3. HFrEF secondary to  NICM/pulmonary hypertension: -Further reduction in LVSF this admission, possibly tachymediated  -Following adequate rate/rhythm control, plan to update limited echo, if EF remains reduced at that time, plan for [facility]; otherwise if EF improves back to baseline, would consider Myoview  -Continue Toprol XL as above -Not currently on ACE-I/ARB/spironolactone/Entresto secondary to acute on CKD  -Lasix held with AKI -Escalate GDMT as able  4. Aortic stenosis: -Severe by echo this admission with a valve area of 0.65 cm^2 with moderate AS by peak velocity, cannot exclude low flow low gradient severe AS  5. PVCs: -Overall low burden on telemetry -Continue Toprol XL as above  6. Possible TIA/CVA: -Transitioned from Plavix to Eliquis and ASA this admission -Intolerant to low dose Crestor and has declined rechallenge of statins  7. Acute on CKD stage III: -Renal function is improving -Lisinopril held  8. Abdominal aortic aneurysm: -Outpatient follow up as directed by radiology in 2 years   25. Enteritis/hematochezia: -He will need close monitoring given recently started DOAC and GI follow up -Management per IM  -Cannot exclude Heyde's syndrome in the setting of his aortic valve disease    For questions or updates, please contact [facility] Please consult [email or web address] for contact info under Cardiology/STEMI.   Signed, Christell Faith, PA-C [place] Pager: [phone] 08/17/2019, 8:28 AM

## 2019-08-17 NOTE — Progress Notes (Signed)
Earlier in shift notified by nurse telemetry reporting tachyarrhythmia. Assessment patient with ST PAC vs afib on telemetry. Patient pale and slightly warm and clammy. Sats initially 94% on room air. Noted history of enteritis, dehydration with acute kidney injury and atrial fib with RVR. Patient with obvious use of abdominal accessory muscles for breathing at rest. Day nurse reports his heart rate increased an he got short of breath and weak with attempts with physical therapy during day. Given his systolic heart failure with EF 25-30%., elevated bnp 2308 and chest xray with bilateral opacities consistant with edema vs viral pnuemonia  Action  1. 80 mg IV lasix given as I know he has only had minor improvement in his creatinine but suspect with his chronic kidney disease and poor EF he is at his baseline renal function  2.  Patient with presentation of abdominal pain caused by infectious vs inflammatory. SARS CoV antigen test negative. Nasopharyngeal swab PCR for COVID ordered and patient placed in airborne contact isolation pending results.

## 2019-08-17 NOTE — Progress Notes (Addendum)
PROGRESS NOTE  Date of service 08/18/2019 at [address] [id number] DOB: August 26, 1927 DOA: 08/14/2019 PCP: Philmore Pali, NP  Brief History   86 year old man PMH CKD stage III, CAD presenting with acute abdominal pain left lower quadrant, vomiting without diarrhea.  CT suggestive of enteritis.  Admitted for acute abdominal pain with acute enteritis, atrial fibrillation with rapid ventricular response.  A & P  Acute enteritis with associated abdominal pain and vomiting, differential infectious, inflammatory, less likely ischemic based on patency of mesenteric vessels. --Afebrile, vital signs stable, no hypoxia or hypotension --This appears to be resolved.  Atrial fibrillation with rapid ventricular response: Still labile with heart rate goes over 110 at rest and on ambulation up to 130s -  possibly new diagnosis, not previously noted in cardiology notes, EKG from 10/30/2018 is equivocal either sinus rhythm with frequent ectopy or intermittent atrial fibrillation.  Echocardiogram as below. CHA2DS2-VASc 7. --Apixaban to be continued --check TSH --0.802 --Consulted Cardiology: Changed to 100mg  BID.  However he still remains uncontrolled -Per cardiology mentation, "TEE-guided DCCV for 1/6 as he has already eaten this morning. He is hesitant to remain admitted for this and ideally would like to go home. He has asked to think about this this morning. We will have staff ambulate him this morning to assess ventricular heart rates and symptoms and revisit with him." -As expected on exertion patient's heart rate went up to 130s and at rest it came down to 99 200 -We will keep n.p.o. from midnight tonight - Appreciate Cardio Recs. Will follow further recs  -As of this afternoon patient agreed for the procedure as mentioned above tomorrow morning - monitor heart rate and blood pressure  Reported blood with bowel movement.  No new episode so far here  - patient reports for some time now he has had  a little bit of blood with hard bowel movements.  He is not aware of hemorrhoids in the past.  He has not seen a GI doctor. --External exam reveals external hemorrhoids.  Rectal exam is unremarkable.  Lab will not send stool card to examiner's I was unable to perform guaiac myself.  However order has been placed for stool guaiac.  I do not suspect significant bleeding.  Gait instability at home, uses a walker, no falls but reports unsteadiness. --PT evaluated recommended home health with PT and aide  -Case manager arranged at  Chronic combined systolic, diastolic CHF secondary to NICM followed by cardiology [facility], sec to HTN heart disease, aortic stenosis and PVCs. -Remains stable/euvolemic -  echocardiogram this admission LVEF 25-30%, global hypokinesis.  Ventricular diastolic parameters left ventricle indeterminate.  Mildly reduced RV function systolic.  Echocardiogram December 2019: LVEF [id number], grade 1 diastolic dysfunction, moderate to severe aortic stenosis.  Could not comment on wall motion abnormalities. --Appears to be well compensated.   - Continue furosemide, lisinopril, metoprolol succinate.  -Follow further cardiology recommendation for possible procedure in the morning  Severe aortic stenosis seen on echocardiogram 08/14/2019 and 07/25/2018. -- Follow-up as an outpatient. -Tinea home medication  CAD, last cath 2016 widely patent coronary arteries, mild global left ventricular hypokinesis  AKI superimposed on CKD stage IIIb --Appears to be at baseline now.  Abdominal aortic aneurysm 3.7 cm --Outpatient ultrasound in 2 years  PMH stroke w/ left eye impact --Continue statin.  Plavix stopped as the patient is now on anticoagulation.  Aortic atherosclerosis, asymptomatic --Continue statin  6 mm right lower lobe nodular opacity seen on chest CT 10/2018  during chart review.  Recommend outpatient follow-up with consideration given to CAT scan in the next 3 months, by  March.  Discussion.  Still appears to be uncontrolled for the most part.  Enteritis seems to have resolved.  He was started on anticoagulation which I think at this point is quite reasonable, he appears a lot younger than stated age and does appear to be robust.  However he has gait instability and therefore PT has been consulted.  PT recommended patient could be discharged home with home health PT and aide.  -Patient is to decide for possible cardioversion in the morning of 08/19/2019  Resolved Hospital Problem list     DVT prophylaxis: apixaban Code Status: Full Family Communication: none Disposition Plan: hom    Thornell Mule, MD  [facility] Direct contact: see [email or web address] (further directions at bottom of note if needed) 7PM-7AM contact night coverage as at bottom of note 08/18/2019, 2:46 PM  LOS: 4 days   Significant Hospital Events   . 1/1 admitted for abdominal pain, vomiting, atrial fibrillation with rapid ventricular response.  CT showed enteritis.   Consults:  .    Procedures:  .   Significant Diagnostic Tests:  . 12/31 EKG atrial fibrillation with rapid ventricular response, LVH with repolarization abnormalities.  Anteroseptal MI, old.  No significant change compared to last test 10/2018 . 1/1 CT abdomen pelvis: Multiple fluid-filled small bowel loops with mesenteric edema suspicious for enteritis, likely infectious or inflammatory.  Ischemia not felt to be likely. . 1/1 echocardiogram LVEF 25-30%.  Left ventricular diastolic parameters indeterminate, mildly reduced systolic function   Micro Data:  . Point-of-care SARS Covid  antigen negative.  No confirmatory test ordered. . C. difficile negative   Antimicrobials:  .   Interval History/Subjective  Lying on bed this morning.  Patient wanted to go home.  However his heart rate remained above 90s or low 100s.  Objective   Vitals:  Vitals:   08/18/19 1223 08/18/19 1444  BP:  (!) 128/102  Pulse: 93 74  Resp:  (!) 22 18  Temp:  (!) 97.3 F (36.3 C)  SpO2: 100% 98%    Exam:  Constitutional.  Appears calm, comfortable.  Appears 18 years younger than stated age. Respiratory.  Clear to auscultation bilaterally.  No wheezes, rales or rhonchi.  Normal respiratory effort. Cardiovascular.  Tachycardic and irregular rhythm.  No murmur, rub or gallop.  No lower extremity edema. Psychiatric.  Grossly normal mood and affect.  Speech fluent and appropriate. GU.  External hemorrhoids visualized.  Anus appears unremarkable.  Rectal exam unremarkable.  Stool brown.  I have personally reviewed the following:   Today's Data  . Creatinine stable, appears to be at baseline, 1.59 . CBC unremarkable  Scheduled Meds: . apixaban  2.5 mg Oral BID  . aspirin EC  81 mg Oral Daily  . furosemide  40 mg Intravenous Once  . metoprolol succinate  100 mg Oral BID  . multivitamin with minerals   Oral Daily   Continuous Infusions:   Active Problems:   CKD (chronic kidney disease), stage III   Aortic stenosis   CHF (congestive heart failure) (HCC)   Acute kidney injury (AKI) with acute tubular necrosis (ATN) (HCC)   Enteritis   Gait instability   Acute kidney injury (HCC)   Atrial fibrillation with rapid ventricular response (HCC)   Abdominal pain   Acute respiratory distress   LOS: 4 days   How to contact the [facility] Attending or Consulting  provider [place] or covering provider during after hours [place], for this patient?  1. Check the care team in [facility] and look for a) attending/consulting TRH provider listed and b) the [facility] team listed 2. Log into [email or web address] and use [place]'s universal password to access. If you do not have the password, please contact the hospital operator. 3. Locate the [facility] provider you are looking for under [facility] and page to a number that you can be directly reached. 4. If you still have difficulty reaching the provider, please page the [facility] (Director on Call) for the  Hospitalists listed on amion for assistance.   Time spent greater than 35 minutes, greater than 50% counseling, coordination of care.

## 2019-08-17 NOTE — Progress Notes (Signed)
PROGRESS NOTE  CORTNEY MCCORQUODALE [id number] DOB: September 12, 1927 DOA: 08/14/2019 PCP: Philmore Pali, NP  Brief History   86 year old man PMH CKD stage III, CAD presenting with acute abdominal pain left lower quadrant, vomiting without diarrhea.  CT suggestive of enteritis.  Admitted for acute abdominal pain with acute enteritis, atrial fibrillation with rapid ventricular response.  A & P  Acute enteritis with associated abdominal pain and vomiting, differential infectious, inflammatory, less likely ischemic based on patency of mesenteric vessels. --Afebrile, vital signs stable, no hypoxia or hypotension --This appears to be resolved.  Atrial fibrillation with rapid ventricular response: Still labile with heart rate goes over 110 -  possibly new diagnosis, not previously noted in cardiology notes, EKG from 10/30/2018 is equivocal either sinus rhythm with frequent ectopy or intermittent atrial fibrillation.  Echocardiogram as below. CHA2DS2-VASc 7. --Apixaban started. --check TSH --0.802 --Rate remains poorly controlled; over 100 despite giving second dose of Toprol-XL 150 mg - Consulted Cardio this AM: Changed to 100mg  BID. Will monitor on that - If ventricular rates are well controlled with increased dose of Toprol, plan for DCCV in 3-4 weeks if he remains in Afib, if ventricular rates remain suboptimally controlled, he may require TEE-guided DCCV prior to discharge - Appreciate Cardio Recs. Will follow further recs  - monitor heart rate and blood pressure with toprol xl 100mg  bid  Reported blood with bowel movement.  No new episode here - patient reports for some time now he has had a little bit of blood with hard bowel movements.  He is not aware of hemorrhoids in the past.  He has not seen a GI doctor. --External exam reveals external hemorrhoids.  Rectal exam is unremarkable.  Lab will not send stool card to examiner's I was unable to perform guaiac myself.  However order has been placed for  stool guaiac.  I do not suspect significant bleeding.  Gait instability at home, uses a walker, no falls but reports unsteadiness. --PT evaluated patient this afternoon and recommended patient could be discharged home once when ready with home health care PT and aide -Consulted case manager  Chronic combined systolic, diastolic CHF secondary to NICM followed by cardiology [facility], sec to HTN heart disease, aortic stenosis and PVCs. -Remains stable/euvolemic -  echocardiogram this admission LVEF 25-30%, global hypokinesis.  Ventricular diastolic parameters left ventricle indeterminate.  Mildly reduced RV function systolic.  Echocardiogram December 2019: LVEF Q000111Q, grade 1 diastolic dysfunction, moderate to severe aortic stenosis.  Could not comment on wall motion abnormalities. --Appears to be well compensated.   - Continue furosemide, lisinopril, metoprolol succinate.   Severe aortic stenosis seen on echocardiogram 08/14/2019 and 07/25/2018. --Asymptomatic.  Follow-up as an outpatient. -Tinea home medication  CAD, last cath 2016 widely patent coronary arteries, mild global left ventricular hypokinesis  AKI superimposed on CKD stage IIIb --Appears to be at baseline now.  Abdominal aortic aneurysm 3.7 cm --Outpatient ultrasound in 2 years  PMH stroke w/ left eye impact --Continue statin.  Plavix stopped as the patient is now on anticoagulation.  Aortic atherosclerosis, asymptomatic --Continue statin  6 mm right lower lobe nodular opacity seen on chest CT 10/2018 during chart review.  Recommend outpatient follow-up with consideration given to CAT scan in the next 3 months, by March.  Discussion.  Patient appears well but his heart rate is not well controlled yet.  Will increase Toprol as above and monitor.  Enteritis seems to have resolved.  He was started on anticoagulation which I think at  this point is quite reasonable, he appears a lot younger than stated age and does appear to be robust.   However he has gait instability and therefore PT has been consulted.  PT recommended patient could be discharged home with home health PT and aide.  Case manager consulted for management.  Depending on the rate and rhythm future course of treatment and management will be decided as per cardiology.  Resolved Hospital Problem list     DVT prophylaxis: apixaban Code Status: Full Family Communication: none Disposition Plan: hom    Thornell Mule, MD  [facility] Direct contact: see [email or web address] (further directions at bottom of note if needed) 7PM-7AM contact night coverage as at bottom of note 08/17/2019, 2:35 PM  LOS: 3 days   Significant Hospital Events   . 1/1 admitted for abdominal pain, vomiting, atrial fibrillation with rapid ventricular response.  CT showed enteritis.   Consults:  .    Procedures:  .   Significant Diagnostic Tests:  . 12/31 EKG atrial fibrillation with rapid ventricular response, LVH with repolarization abnormalities.  Anteroseptal MI, old.  No significant change compared to last test 10/2018 . 1/1 CT abdomen pelvis: Multiple fluid-filled small bowel loops with mesenteric edema suspicious for enteritis, likely infectious or inflammatory.  Ischemia not felt to be likely. . 1/1 echocardiogram LVEF 25-30%.  Left ventricular diastolic parameters indeterminate, mildly reduced systolic function   Micro Data:  . Point-of-care SARS Covid  antigen negative.  No confirmatory test ordered. . C. difficile negative   Antimicrobials:  .   Interval History/Subjective  Patient was having an event of worsening shortness of breath overnight.  Was given IV Lasix 80 mg x 1 overnight.  Currently on 2 L supplemental oxygen via nasal cannula.  Objective   Vitals:  Vitals:   08/17/19 0349 08/17/19 0829  BP: (!) 130/99 (!) 134/102  Pulse: 100 (!) 110  Resp: 20 18  Temp:  98.9 F (37.2 C)  SpO2: 98% 98%    Exam:  Constitutional.  Appears calm, comfortable.  Appears  51 years younger than stated age. Respiratory.  Clear to auscultation bilaterally.  No wheezes, rales or rhonchi.  Normal respiratory effort. Cardiovascular.  Regular rate and rhythm.  No murmur, rub or gallop.  No lower extremity edema. Psychiatric.  Grossly normal mood and affect.  Speech fluent and appropriate. GU.  External hemorrhoids visualized.  Anus appears unremarkable.  Rectal exam unremarkable.  Stool brown.  I have personally reviewed the following:   Today's Data  . Creatinine stable, appears to be at baseline, 1.59 . CBC unremarkable  Scheduled Meds: . apixaban  2.5 mg Oral BID  . aspirin EC  81 mg Oral Daily  . metoprolol succinate  100 mg Oral BID  . multivitamin with minerals   Oral Daily   Continuous Infusions:   Active Problems:   CKD (chronic kidney disease), stage III   Aortic stenosis   CHF (congestive heart failure) (HCC)   Acute kidney injury (AKI) with acute tubular necrosis (ATN) (HCC)   Enteritis   Gait instability   Acute kidney injury (HCC)   Atrial fibrillation with rapid ventricular response (HCC)   Abdominal pain   LOS: 3 days   How to contact the [facility] Attending or Consulting provider [place] or covering provider during after hours [place], for this patient?  1. Check the care team in [facility] and look for a) attending/consulting TRH provider listed and b) the [facility] team listed 2. Log into  [email or web address] and use [place]'s universal password to access. If you do not have the password, please contact the hospital operator. 3. Locate the [facility] provider you are looking for under [facility] and page to a number that you can be directly reached. 4. If you still have difficulty reaching the provider, please page the [facility] (Director on Call) for the Hospitalists listed on amion for assistance.   Time spent greater than 35 minutes, greater than 50% counseling, coordination of care.

## 2019-08-17 NOTE — TOC Initial Note (Signed)
Transition of Care [facility]) - Initial/Assessment Note    Patient Details  Name: Clinton Holmes MRN: [id number] Date of Birth: 09-30-1927  [facility] CM/SW Contact:    Shelbie Ammons, RN Phone Number: 08/17/2019, 4:13 PM  Clinical Narrative:              RNCM placed call to patient's daughter due to being unable to reach patient in room. Patient on isolation precautions due to Covid testing. Patient's daughter at hospital due to not knowing he had been placed on isolation. Was able to speak to her in person and explain situation. Patient lives alone however has support from son and daughters daily. Daughter is agreeable to home health services and requests [facility].  Placed call to Corene Cornea to arrange who did accept patient.    Expected Discharge Plan: [place]     Patient Goals and CMS Choice Patient states their goals for this hospitalization and ongoing recovery are:: per daughter for him to get back home and get to feeling better      Expected Discharge Plan and Services Expected Discharge Plan: [place]   Discharge Planning Services: CM Consult   Living arrangements for the past 2 months: Single Family Home                           HH Arranged: RN, PT, OT [facility] Agency: [place] ([place]) Date HH Agency Contacted: 08/17/19 Time HH Agency Contacted: 1030    Prior Living Arrangements/Services Living arrangements for the past 2 months: [place] with:: Self Patient language and need for interpreter reviewed:: Yes Do you feel safe going back to the place where you live?: Yes      Need for Family Participation in Patient Care: Yes (Comment) Care giver support system in place?: Yes (comment)   Criminal Activity/Legal Involvement Pertinent to Current Situation/Hospitalization: No - Comment as needed  Activities of Daily Living Home Assistive Devices/Equipment: Cane (specify quad or straight),  Eyeglasses, Walker (specify type) ADL Screening (condition at time of admission) Patient's cognitive ability adequate to safely complete daily activities?: Yes Is the patient deaf or have difficulty hearing?: Yes Does the patient have difficulty seeing, even when wearing glasses/contacts?: No Does the patient have difficulty concentrating, remembering, or making decisions?: No Patient able to express need for assistance with ADLs?: Yes Does the patient have difficulty dressing or bathing?: No Independently performs ADLs?: Yes (appropriate for developmental age) Does the patient have difficulty walking or climbing stairs?: No Weakness of Legs: None Weakness of Arms/Hands: None  Permission Sought/Granted                  Emotional Assessment Appearance:: (Daughter assessed via telephone due to isolation status) Attitude/Demeanor/Rapport: Engaged       Psych Involvement: No (comment)  Admission diagnosis:  Enteritis [K52.9] Atrial fibrillation with rapid ventricular response ([place]) [I48.91] Acute kidney injury ([place]) [N17.9] Acute kidney injury (AKI) with acute tubular necrosis (ATN) ([place]) [N17.0] Abdominal pain [R10.9] Patient Active Problem List   Diagnosis Date Noted  . Acute respiratory distress   . Abdominal pain 08/16/2019  . Acute kidney injury ([place])   . Atrial fibrillation with rapid ventricular response ([place])   . Gait instability 08/15/2019  . Acute kidney injury (AKI) with acute tubular necrosis (ATN) ([place]) 08/14/2019  . Enteritis 08/14/2019  . CHF (congestive heart failure) ([place]) 07/24/2018  . Aortic stenosis 12/23/2014  .  Ventricular bigeminy 12/21/2014  . Essential hypertension   . CKD (chronic kidney disease), stage III   . Hypertension   . OA (osteoarthritis)    PCP:  Philmore Pali, NP Pharmacy:   [facility] [address] [place] [address] Phone: [phone] Fax: [phone]  [place], [place] - [address] [place] [address] Phone: [phone] Fax: [phone]     Social Determinants of Health (SDOH) Interventions    Readmission Risk Interventions No flowsheet data found.

## 2019-08-18 ENCOUNTER — Ambulatory Visit: Payer: Medicare Other | Admitting: Physician Assistant

## 2019-08-18 DIAGNOSIS — I5023 Acute on chronic systolic (congestive) heart failure: Secondary | ICD-10-CM

## 2019-08-18 LAB — BASIC METABOLIC PANEL
Anion gap: 7 (ref 5–15)
BUN: 39 mg/dL — ABNORMAL HIGH (ref 8–23)
CO2: 27 mmol/L (ref 22–32)
Calcium: 9.6 mg/dL (ref 8.9–10.3)
Chloride: 104 mmol/L (ref 98–111)
Creatinine, Ser: 2 mg/dL — ABNORMAL HIGH (ref 0.61–1.24)
GFR calc Af Amer: 33 mL/min — ABNORMAL LOW (ref >60)
GFR calc non Af Amer: 29 mL/min — ABNORMAL LOW (ref >60)
Glucose, Bld: 105 mg/dL — ABNORMAL HIGH (ref 70–99)
Potassium: 4.4 mmol/L (ref 3.5–5.1)
Sodium: 138 mmol/L (ref 135–145)

## 2019-08-18 LAB — PROTIME-INR
INR: 1.6 — ABNORMAL HIGH (ref 0.8–1.2)
Prothrombin Time: 19 seconds — ABNORMAL HIGH (ref 11.4–15.2)

## 2019-08-18 MED ORDER — FUROSEMIDE 10 MG/ML IJ SOLN
40.0000 mg | Freq: Once | INTRAMUSCULAR | Status: AC
  Administered 2019-08-18: 40 mg via INTRAVENOUS
  Filled 2019-08-18: qty 4

## 2019-08-18 MED ORDER — SODIUM CHLORIDE 0.9 % IV SOLN: INTRAVENOUS | Status: DC

## 2019-08-18 NOTE — TOC Progression Note (Signed)
Transition of Care [facility]) - Progression Note    Patient Details  Name: Clinton Holmes MRN: [id number] Date of Birth: 1928/06/30  Transition of Care [facility]) CM/SW Contact  Shelbie Ammons, RN Phone Number: 08/18/2019, 10:01 AM  Clinical Narrative:     RNCM placed call to daughter to follow up regarding home health services and any needed equipment. Daughter [name] is aware of home health being set up through and [facility] and reports patient has 2 rolling walkers at home and does not need anything else.     Expected Discharge Plan: [place]    Expected Discharge Plan and Services Expected Discharge Plan: [place]   Discharge Planning Services: CM Consult   Living arrangements for the past 2 months: Single Family Home                           HH Arranged: RN, PT, OT [facility] Agency: [place] [facility] ([facility]) Date HH Agency Contacted: 08/17/19 Time HH Agency Contacted: 1030     Social Determinants of Health (SDOH) Interventions    Readmission Risk Interventions No flowsheet data found.

## 2019-08-18 NOTE — Care Management Important Message (Signed)
Important Message  Patient Details  Name: Clinton Holmes MRN: [id number] Date of Birth: 1928/05/04   Medicare Important Message Given:  Yes     Juliann Pulse A Steven Basso 08/18/2019, 10:54 AM

## 2019-08-18 NOTE — Progress Notes (Signed)
Patient was ambulated early afternoon with O2 saturation at rest of 97% on room air dropping to 84% on room air with ambulation.  Since then he has been placed back on supplemental oxygen at 2 L via nasal cannula with O2 saturation 95%.  While ambulating the patient's heart rate peaked at 136 bpm.  No evidence on telemetry of significant bradycardic events.  With patient's noted ambulatory hypoxia and echo demonstrating dilated IVC with moderately elevated PASP we will give the patient 40 mg of IV Lasix this afternoon.  We discussed TEE guided cardioversion with the patient and his daughter this afternoon.  After careful review of history and examination, the risks and benefits of transesophageal echocardiogram have been explained including risks of esophageal damage, perforation (1:10,000 risk), bleeding, pharyngeal hematoma as well as other potential complications associated with conscious sedation including aspiration, arrhythmia, respiratory failure and death. Alternatives to treatment were discussed, questions were answered. Patient is willing to proceed.   Further optimization of medications pending TEE guided cardioversion.

## 2019-08-18 NOTE — Progress Notes (Signed)
SATURATION QUALIFICATIONS: (This note is used to comply with regulatory documentation for home oxygen)  Patient Saturations on Room Air at Rest = 97%  Patient Saturations on Room Air while Ambulating =84%  Patient Saturations on 2 Liters of oxygen while Ambulating = 95%  Please briefly explain why patient needs home oxygen:pts sats dropped to 84%. While amb on r/a.  Hr dropped to 50. Pt rested sats returned to 98% on 2l and hr  99-100. Pt amb again  With 02  It was exertional for him/ resp 36 hr 136/ sats 94% 2l. He had to pause and rest in hallway.  He cont to room and sat down on bed  sats  98%  Hr  Dropped to 29. resp 32 with dyspnea. After resting HR RETURNED TO 99. SATS TO 100% ON 2L AND RESP 22.

## 2019-08-18 NOTE — Progress Notes (Signed)
Multiple attempts to get out of bed. Ask that bed alarm be cut off. Pt is confused, thinks that a car is parked out side of his room and tried to get on floor when asked to lay in bed. Difficult to redirect. Bed alarm remains on d/t confusion and unstable gait.

## 2019-08-18 NOTE — Progress Notes (Signed)
Progress Note  Patient Name: Clinton Holmes Date of Encounter: 08/18/2019  Primary Cardiologist: Fletcher Anon  Subjective   Reports feeling well at rest this morning. Eager to go home. Remains in Afib with ventricular rates in the low 100s to 110s bpm at rest. Tolerating higher dose Toprol XL. Confusion noted overnight. He reports dyspnea is improved. He denies any chest pain, lower extremity swelling or palpitations.   Inpatient Medications    Scheduled Meds: . apixaban  2.5 mg Oral BID  . aspirin EC  81 mg Oral Daily  . metoprolol succinate  100 mg Oral BID  . multivitamin with minerals   Oral Daily   Continuous Infusions:  PRN Meds: acetaminophen **OR** acetaminophen, magnesium hydroxide, ondansetron **OR** ondansetron (ZOFRAN) IV, traZODone   Vital Signs    Vitals:   08/17/19 2206 08/17/19 2309 08/18/19 0314 08/18/19 0804  BP: (!) 118/94 (!) 109/91  124/90  Pulse: 99 99  95  Resp:  18  18  Temp:  97.6 F (36.4 C)  97.6 F (36.4 C)  TempSrc:  Oral  Oral  SpO2: 96% 97%  100%  Weight:   131.1 kg   Height:        Intake/Output Summary (Last 24 hours) at 08/18/2019 1002 Last data filed at 08/18/2019 0154 Gross per 24 hour  Intake --  Output 100 ml  Net -100 ml   Filed Weights   08/13/19 1800 08/18/19 0314  Weight: 109.3 kg 131.1 kg    Telemetry    Afib with ventricular rates in the low 100s to 110s bpm - Personally Reviewed  ECG    No new tracings - Personally Reviewed  Physical Exam   GEN: No acute distress.   Neck: No JVD. Cardiac: Mildly tachycardic, irregularly irregular, II/VI systolic murmur at the RUSB, no rubs, or gallops.  Respiratory: Diminished breath sounds bilaterally.  GI: Soft, nontender, non-distended.   MS: No edema; No deformity. Neuro:  Alert and oriented x 3; Nonfocal.  Psych: Normal affect.  Labs    Chemistry Recent Labs  Lab 08/13/19 1809 08/16/19 2008 08/17/19 0753 08/18/19 0339  NA 141 139 138 138  K 4.3 4.8 4.8 4.4   CL 103 107 104 104  CO2 25 19* 21* 27  GLUCOSE 133* 188* 119* 105*  BUN 30* 34* 38* 39*  CREATININE 1.76* 2.09* 1.98* 2.00*  CALCIUM 10.0 9.4 9.7 9.6  PROT 7.4  --   --   --   ALBUMIN 4.7  --   --   --   AST 30  --   --   --   ALT 20  --   --   --   ALKPHOS 61  --   --   --   BILITOT 1.2  --   --   --   GFRNONAA 34* 27* 29* 29*  GFRAA 39* 32* 34* 33*  ANIONGAP 13 13 13 7      Hematology Recent Labs  Lab 08/15/19 0520 08/16/19 2008 08/17/19 0753  WBC 7.6 11.5* 10.8*  RBC 4.48 4.66 4.79  HGB 12.6* 13.6 13.5  HCT 41.3 40.9 43.5  MCV 92.2 87.8 90.8  MCH 28.1 29.2 28.2  MCHC 30.5 33.3 31.0  RDW 15.2 15.3 15.2  PLT 157 181 171    Cardiac EnzymesNo results for input(s): TROPONINI in the last 168 hours. No results for input(s): TROPIPOC in the last 168 hours.   BNP Recent Labs  Lab 08/16/19 2007  BNP 2,308.0*  DDimer No results for input(s): DDIMER in the last 168 hours.   Radiology    DG Chest Port 1 View  Result Date: 08/16/2019 IMPRESSION: Cardiomegaly with mild, diffuse interstitial pulmonary opacity and small, layering bilateral pleural effusions, likely edema. Atypical/viral infection is a differential consideration. Electronically Signed   By: Eddie Candle M.D.   On: 08/16/2019 20:35    Cardiac Studies   2D Echo 08/14/2019: 1. Left ventricular ejection fraction, by visual estimation, is 25 to 30%. The left ventricle has severely decreased function. There is mildly increased left ventricular hypertrophy. 2. Left ventricular diastolic parameters are indeterminate. 3. The left ventricle demonstrates global hypokinesis. 4. Global right ventricle has mildly reduced systolic function.The right ventricular size is normal. No increase in right ventricular wall thickness. 5. Left atrial size was mildly dilated. 6. Right atrial size was normal. 7. The mitral valve is grossly normal. Trivial mitral valve regurgitation. 8. The tricuspid valve is normal in  structure. 9. Aortic valve area, by VTI measures 0.65 cm. 10. Aortic valve mean gradient measures 22.0 mmHg. 11. Aortic valve peak gradient measures 41.5 mmHg. 12. The aortic valve is abnormal. Aortic valve regurgitation is not visualized. Severe aortic valve stenosis. 13. The pulmonic valve was not well visualized. Pulmonic valve regurgitation is not visualized. 14. Moderately elevated pulmonary artery systolic pressure. 15. The inferior vena cava is dilated in size with <50% respiratory variability, suggesting right atrial pressure of 15 mmHg. __________  24-hour Holter 07/2018: Normal sinus rhythm,  Avg rate 64 bpm  Rare PAC (<1%)  Frequent Ventricular beats (35%), total of 31, 721  23K isolated beats 1,134 couplets 13,950 bigeminal beats NSVT, longest run 26 beats, 1,037 runs total (5,740 beats)  Patient Profile     86 y.o. male with history of mild nonobstructive CAD by LHC in 2016, HFrEF secondary to NICM, moderate to severe aortic stenosis, pulmonary hypertension, possible TIA/CVA with left visual changes, acoustic neuroma s/p gamma knife procedure at [facility] in 1990s, syncope, CKD stage III, frequent PVCs, HTN, and HLD who is being seen today for the evaluation of new onset Afib with RVR.  Assessment & Plan    1. New onset Afib with RVR: -Likely in the setting of acute enteritis  -He remains in Afib with ventricular rates are in the low 100s to 110s bpm -With his cardiomyopathy and underlying severe aortic stenosis, he will not likely tolerate tachycardic rates -We have recommended TEE-guided DCCV for 1/6 as he has already eaten this morning. He is hesitant to remain admitted for this and ideally would like to go home. He has asked to think about this this morning. We will have staff ambulate him this morning to assess ventricular heart rates and symptoms and revisit with him. I suspect with ambulation, his ventricular rates will be poorly controlled -Continue higher  doseToprol XL to 100 mg bid -Not a good candidate for digoxin given underlying CKD and age -Calcium channel blocker is not ideal given his cardiomyopathy  -Would attempt to avoid amiodarone given new Afib diagnosis of uncertain chronicity without prior OAC -Continue Eliquis 2.5 mg bid (age and SCr) -CHADS2VASc at least 7 (CHF, HTN, age x 2, TIA/CVA x 2, vascular disease)  2. Elevated troponin/nonobstructive CAD: -No chest pain -Minimally elevated at 19 with an unchanged delta -Not consistent with ACS -Likely supply demand ischemia in the setting of acute enteritis and acute on CKD -Prior cath with nonobstructive disease -Plan for outpatient ischemic evaluation   3. HFrEF secondary to NICM/pulmonary  hypertension: -Further reduction in LVSF this admission, possibly tachymediated vs in the setting of his aortic valve disease  -As an outpatient, he will need ischemic evaluation as below -Continue Toprol XL as above -Not currently on ACE-I/ARB/spironolactone/Entresto secondary to acute on CKD  -Lasix held with AKI -Escalate GDMT as able  4. Aortic stenosis: -Severe by echo this admission with a valve area of 0.65 cm^2 with moderate AS by peak velocity, cannot exclude low flow low gradient severe AS -There has been some confusion this admission, recommend he follow up with his primary cardiologist as an outpatient to consider [facility] as part of potential TAVR work up -Cannot exclude his aortic valve disease playing a role in his worsening cardiomyopathy   5. PVCs: -Overall low burden on telemetry -Continue Toprol XL as above  6. Possible TIA/CVA: -Transitioned from Plavix to Eliquis and ASA this admission, consider stopping ASA -Intolerant to low dose Crestor and has declined rechallenge of statins  7. Acute on CKD stage III: -Renal function stable -Lisinopril held  8. Abdominal aortic aneurysm: -Outpatient follow up as directed by radiology in 2 years   30.  Enteritis/hematochezia: -He will need close monitoring given recently started DOAC and GI follow up -Management per IM  -Cannot exclude Heyde's syndrome in the setting of his aortic valve disease   For questions or updates, please contact [facility] Please consult [email or web address] for contact info under Cardiology/STEMI.    Signed, Christell Faith, PA-C [place] Pager: [phone] 08/18/2019, 10:02 AM

## 2019-08-18 NOTE — Progress Notes (Signed)
Physical Therapy Treatment Patient Details Name: Clinton Holmes MRN: [id number] DOB: 05-Jul-1928 Today's Date: 08/18/2019    History of Present Illness 86 year old male with PMH of CKD, CAD, HTN, murmur, OA and ventricular bigeminy presented to ED on 08/13/2018 with complaints of abdominal pain, nausea, vomiting and loose stools.  A CT scan of the abdomen was performed which revealed evidence of enteritis.  Patient also noted to be in atrial fibrillation with rapid ventricular response in the emergency department and as such IV metoprolol 5 mg was administered with rate control.    PT Comments    Pt is making gradual progress towards goals with hesitancy performing OOB with PT as he was ambulated earlier with RN staff and is fatigued. Ambulated ~ 17' with RN staff. Agreeable to ambulate to recliner. Good endurance with there-ex, however needs cues for correct technique. Will continue to progress as able.   Follow Up Recommendations  Home health PT;Supervision/Assistance - 24 hour     Equipment Recommendations  Rolling walker with 5" wheels    Recommendations for Other Services       Precautions / Restrictions Precautions Precautions: Fall Restrictions Weight Bearing Restrictions: No    Mobility  Bed Mobility Overal bed mobility: Needs Assistance Bed Mobility: Supine to Sit     Supine to sit: Min assist     General bed mobility comments: needs encouragement to participate and perform OOB. Once seated at EOB, able to sit with upright posture  Transfers Overall transfer level: Needs assistance Equipment used: Rolling walker (2 wheeled) Transfers: Sit to/from Stand Sit to Stand: Min assist         General transfer comment: upright posture. Takes several attempts to stand. All mobility perfomed on 2L of O2.  Ambulation/Gait Ambulation/Gait assistance: Min assist Gait Distance (Feet): 5 Feet Assistive device: Rolling walker (2 wheeled) Gait Pattern/deviations:  Step-through pattern     General Gait Details: ambulated in room to recliner despite cues to attempt further distance. Fatigues quickly. Would need chair follow for increased distance   Stairs             Wheelchair Mobility    Modified Rankin (Stroke Patients Only)       Balance Overall balance assessment: Needs assistance Sitting-balance support: No upper extremity supported Sitting balance-Leahy Scale: Good Sitting balance - Comments: able to sit steady EOB   Standing balance support: Bilateral upper extremity supported Standing balance-Leahy Scale: Fair                              Cognition Arousal/Alertness: Awake/alert Behavior During Therapy: WFL for tasks assessed/performed Overall Cognitive Status: Within Functional Limits for tasks assessed                                        Exercises Other Exercises Other Exercises: supine ther-ex performed on B LE including AP, quad sets, SLRS, resisted heel slides, and hip add squeezes. All ther-ex performed x 12 reps with cga. Safe technique with O2 sats WNL on 2L of O2    General Comments        Pertinent Vitals/Pain Pain Assessment: No/denies pain    Home Living                      Prior Function  PT Goals (current goals can now be found in the care plan section) Acute Rehab PT Goals Patient Stated Goal: to get out of here/go home PT Goal Formulation: With patient Time For Goal Achievement: 08/30/19 Potential to Achieve Goals: Good Progress towards PT goals: Progressing toward goals    Frequency    Min 2X/week      PT Plan Current plan remains appropriate    Co-evaluation              AM-PAC PT "6 Clicks" Mobility   Outcome Measure  Help needed turning from your back to your side while in a flat bed without using bedrails?: A Little Help needed moving from lying on your back to sitting on the side of a flat bed without using  bedrails?: A Little Help needed moving to and from a bed to a chair (including a wheelchair)?: A Little Help needed standing up from a chair using your arms (e.g., wheelchair or bedside chair)?: A Little Help needed to walk in hospital room?: A Little Help needed climbing 3-5 steps with a railing? : A Lot 6 Click Score: 17    End of Session Equipment Utilized During Treatment: Oxygen;Gait belt Activity Tolerance: Patient limited by fatigue Patient left: in chair;with chair alarm set Nurse Communication: Mobility status PT Visit Diagnosis: Unsteadiness on feet (R26.81);History of falling (Z91.81);Muscle weakness (generalized) (M62.81)     Time: [id number] PT Time Calculation (min) (ACUTE ONLY): 25 min  Charges:  $Gait Training: 8-22 mins $Therapeutic Exercise: 8-22 mins                     Clinton Holmes, PT, DPT [phone]    Aerielle Stoklosa 08/18/2019, 5:13 PM

## 2019-08-19 ENCOUNTER — Encounter
Admission: EM | Disposition: A | Discharge status: Home Health | Payer: Self-pay | Source: Home / Self Care | Attending: Internal Medicine

## 2019-08-19 ENCOUNTER — Inpatient Hospital Stay (HOSPITAL_COMMUNITY)
Admit: 2019-08-19 | Discharge: 2019-08-19 | Disposition: A | Discharge status: Home / Self Care | Payer: Medicare Other | Attending: Physician Assistant | Admitting: Physician Assistant

## 2019-08-19 ENCOUNTER — Encounter: Payer: Self-pay | Admitting: Family Medicine

## 2019-08-19 ENCOUNTER — Inpatient Hospital Stay: Payer: Medicare Other | Admitting: Anesthesiology

## 2019-08-19 DIAGNOSIS — N1831 Chronic kidney disease, stage 3a: Secondary | ICD-10-CM

## 2019-08-19 DIAGNOSIS — I340 Nonrheumatic mitral (valve) insufficiency: Secondary | ICD-10-CM

## 2019-08-19 DIAGNOSIS — R0603 Acute respiratory distress: Secondary | ICD-10-CM

## 2019-08-19 DIAGNOSIS — I361 Nonrheumatic tricuspid (valve) insufficiency: Secondary | ICD-10-CM

## 2019-08-19 DIAGNOSIS — I350 Nonrheumatic aortic (valve) stenosis: Secondary | ICD-10-CM

## 2019-08-19 HISTORY — PX: TEE WITHOUT CARDIOVERSION: SHX5443

## 2019-08-19 HISTORY — PX: CARDIOVERSION: SHX1299

## 2019-08-19 LAB — BASIC METABOLIC PANEL
Anion gap: 9 (ref 5–15)
BUN: 39 mg/dL — ABNORMAL HIGH (ref 8–23)
CO2: 27 mmol/L (ref 22–32)
Calcium: 9.4 mg/dL (ref 8.9–10.3)
Chloride: 103 mmol/L (ref 98–111)
Creatinine, Ser: 1.93 mg/dL — ABNORMAL HIGH (ref 0.61–1.24)
GFR calc Af Amer: 35 mL/min — ABNORMAL LOW (ref >60)
GFR calc non Af Amer: 30 mL/min — ABNORMAL LOW (ref >60)
Glucose, Bld: 94 mg/dL (ref 70–99)
Potassium: 4.1 mmol/L (ref 3.5–5.1)
Sodium: 139 mmol/L (ref 135–145)

## 2019-08-19 LAB — PROTIME-INR
INR: 1.6 — ABNORMAL HIGH (ref 0.8–1.2)
Prothrombin Time: 18.6 seconds — ABNORMAL HIGH (ref 11.4–15.2)

## 2019-08-19 SURGERY — CARDIOVERSION
Anesthesia: General

## 2019-08-19 SURGERY — ECHOCARDIOGRAM, TRANSESOPHAGEAL
Anesthesia: General

## 2019-08-19 MED ORDER — BUTAMBEN-TETRACAINE-BENZOCAINE 2-2-14 % EX AERO
INHALATION_SPRAY | CUTANEOUS | Status: AC
  Filled 2019-08-19: qty 5

## 2019-08-19 MED ORDER — PROPOFOL 500 MG/50ML IV EMUL
INTRAVENOUS | Status: AC
  Filled 2019-08-19: qty 50

## 2019-08-19 MED ORDER — PROPOFOL 10 MG/ML IV BOLUS
INTRAVENOUS | Status: DC | PRN
  Administered 2019-08-19: 20 mg via INTRAVENOUS
  Administered 2019-08-19: 30 mg via INTRAVENOUS
  Administered 2019-08-19: 10 mg via INTRAVENOUS
  Administered 2019-08-19 (×2): 20 mg via INTRAVENOUS

## 2019-08-19 MED ORDER — FUROSEMIDE 10 MG/ML IJ SOLN
INTRAMUSCULAR | Status: AC
  Filled 2019-08-19: qty 4

## 2019-08-19 MED ORDER — SODIUM CHLORIDE FLUSH 0.9 % IV SOLN
INTRAVENOUS | Status: AC
  Filled 2019-08-19: qty 10

## 2019-08-19 MED ORDER — FUROSEMIDE 10 MG/ML IJ SOLN
40.0000 mg | Freq: Two times a day (BID) | INTRAMUSCULAR | Status: DC
  Administered 2019-08-19 – 2019-08-23 (×8): 40 mg via INTRAVENOUS
  Filled 2019-08-19 (×8): qty 4

## 2019-08-19 MED ORDER — LIDOCAINE VISCOUS HCL 2 % MT SOLN
OROMUCOSAL | Status: AC
  Filled 2019-08-19: qty 15

## 2019-08-19 NOTE — Progress Notes (Signed)
Transesophageal Echocardiogram :  Indication: aortic valve stenosis, atrial fibrillation, respiratory distress, cardiomyopathy Requesting/ordering  physician: Dr. Saunders Revel  Procedure: Benzocaine spray x2 and 2 mls x 2 of viscous lidocaine were given orally to provide local anesthesia to the oropharynx. The patient was positioned supine on the left side, bite block provided. The patient was moderately sedated with the doses of versed and fentanyl as detailed below.  Using digital technique an omniplane probe was advanced into the distal esophagus without incident.   Moderate sedation:  Sedation used:  Propofol by anesthesia   See report in EPIC  for complete details: In brief, transgastric imaging revealed severely depressed  LV function with global hypokinesis, no mural apical thrombus.  .  Estimated ejection fraction was 25% to 30%.  Right sided cardiac chambers with mildly reduced function.  Severe, if not critical aortic valve stenosis, heavily calcified.  No significant regurgitation.   Imaging of the septum showed no ASD or VSD Bubble study was negative for shunt 2D and color flow confirmed no PFO  The LA was well visualized in orthogonal views.  There was  spontaneous contrast in the LA,  no thrombus in the LA and LA appendage   The descending thoracic aorta had mild  mural aortic debris with no evidence of aneurysmal dilation or dissection  Will proceed with cardioversion for atrial fibrillation.    Ida Rogue 08/19/2019 9:23 AM

## 2019-08-19 NOTE — Transfer of Care (Signed)
Immediate Anesthesia Transfer of Care Note  Patient: Clinton Holmes  Procedure(s) Performed: TRANSESOPHAGEAL ECHOCARDIOGRAM (TEE) (N/A ) CARDIOVERSION (N/A )  Patient Location: PACU  Anesthesia Type:General  Level of Consciousness: drowsy and patient cooperative  Airway & Oxygen Therapy: Patient Spontanous Breathing and Patient connected to nasal cannula oxygen  Post-op Assessment: Report given to RN and Post -op Vital signs reviewed and stable  Post vital signs: Reviewed and stable  Last Vitals:  Vitals Value Taken Time  BP 93/69 08/19/19 0929  Temp    Pulse 52 08/19/19 0928  Resp 18 08/19/19 0929  SpO2 97 % 08/19/19 0929  Vitals shown include unvalidated device data.  Last Pain:  Vitals:   08/19/19 0819  TempSrc:   PainSc: 0-No pain         Complications: No apparent anesthesia complications

## 2019-08-19 NOTE — Progress Notes (Signed)
PT Cancellation Note  Patient Details Name: BENTYN CLABORN MRN: [id number] DOB: October 28, 1927   Cancelled Treatment:    Reason Eval/Treat Not Completed: Other (comment). Pt currently out of room for TEE. Unavailable for therapy at this time. Will re-attempt next available day.   Citlalic Norlander 08/19/2019, 8:26 AM Greggory Stallion, PT, DPT [phone]

## 2019-08-19 NOTE — Anesthesia Postprocedure Evaluation (Signed)
Anesthesia Post Note  Patient: Clinton Holmes  Procedure(s) Performed: TRANSESOPHAGEAL ECHOCARDIOGRAM (TEE) (N/A ) CARDIOVERSION (N/A )  Patient location during evaluation: Specials Recovery Anesthesia Type: General Level of consciousness: awake and alert Pain management: pain level controlled Vital Signs Assessment: post-procedure vital signs reviewed and stable Respiratory status: spontaneous breathing, nonlabored ventilation, respiratory function stable and patient connected to nasal cannula oxygen Cardiovascular status: blood pressure returned to baseline and stable Postop Assessment: no apparent nausea or vomiting Anesthetic complications: no     Last Vitals:  Vitals:   08/19/19 0905 08/19/19 0929  BP:  93/69  Pulse: 99   Resp: 11 18  Temp:    SpO2: 98% 97%    Last Pain:  Vitals:   08/19/19 0819  TempSrc:   PainSc: 0-No pain                 Martha Clan

## 2019-08-19 NOTE — Progress Notes (Signed)
PROGRESS NOTE  Date of service 08/18/2019 at [address] [id number] DOB: 06-16-1928 DOA: 08/14/2019 PCP: Philmore Pali, NP  Brief History   86 year old man PMH CKD stage III, CAD presenting with acute abdominal pain left lower quadrant, vomiting without diarrhea.  CT suggestive of enteritis.  Admitted for acute abdominal pain with acute enteritis, atrial fibrillation with rapid ventricular response.  A & P  Acute enteritis with associated abdominal pain and vomiting, differential infectious, inflammatory, less likely ischemic based on patency of mesenteric vessels. --Afebrile, vital signs stable, no hypoxia or hypotension --This appears to be resolved.  Atrial fibrillation with rapid ventricular response: Still labile with heart rate goes over 110 at rest and on ambulation up to 130s -  possibly new diagnosis, not previously noted in cardiology notes, EKG from 10/30/2018 is equivocal either sinus rhythm with frequent ectopy or intermittent atrial fibrillation.  Echocardiogram as below. CHA2DS2-VASc 7. --Apixaban to be continued --check TSH --0.802 --Consulted Cardiology: Changed to 100mg  BID.  However he still remains uncontrolled -Per cardiology mentation, "TEE-guided DCCV for 1/6 as he has already eaten this morning. He is hesitant to remain admitted for this and ideally would like to go home. He has asked to think about this this morning. We will have staff ambulate him this morning to assess ventricular heart rates and symptoms and revisit with him." -As expected on exertion patient's heart rate went up to 130s and at rest it came down to 99 200 -We will keep n.p.o. from midnight tonight - Appreciate Cardio Recs. Will follow further recs  -As of this afternoon patient agreed for the procedure as mentioned above tomorrow morning - monitor heart rate and blood pressure  Reported blood with bowel movement.  No new episode so far here  - patient reports for some time now he has had  a little bit of blood with hard bowel movements.  He is not aware of hemorrhoids in the past.  He has not seen a GI doctor. --External exam reveals external hemorrhoids.  Rectal exam is unremarkable.  Lab will not send stool card to examiner's I was unable to perform guaiac myself.  However order has been placed for stool guaiac.  I do not suspect significant bleeding.  Gait instability at home, uses a walker, no falls but reports unsteadiness. --PT evaluated recommended home health with PT and aide  -Case manager arranged at  Chronic combined systolic, diastolic CHF secondary to NICM followed by cardiology [facility], sec to HTN heart disease, aortic stenosis and PVCs. -Remains stable/euvolemic -  echocardiogram this admission LVEF 25-30%, global hypokinesis.  Ventricular diastolic parameters left ventricle indeterminate.  Mildly reduced RV function systolic.  Echocardiogram December 2019: LVEF [id number], grade 1 diastolic dysfunction, moderate to severe aortic stenosis.  Could not comment on wall motion abnormalities. --Appears to be well compensated.   - Continue furosemide, lisinopril, metoprolol succinate.  -Follow further cardiology recommendation for possible procedure in the morning  Severe aortic stenosis seen on echocardiogram 08/14/2019 and 07/25/2018. -- Follow-up as an outpatient. -Tinea home medication  CAD, last cath 2016 widely patent coronary arteries, mild global left ventricular hypokinesis  AKI superimposed on CKD stage IIIb --Appears to be at baseline now.  Abdominal aortic aneurysm 3.7 cm --Outpatient ultrasound in 2 years  PMH stroke w/ left eye impact --Continue statin.  Plavix stopped as the patient is now on anticoagulation.  Aortic atherosclerosis, asymptomatic --Continue statin  6 mm right lower lobe nodular opacity seen on chest CT 10/2018  during chart review.  Recommend outpatient follow-up with consideration given to CAT scan in the next 3 months, by  March.  Discussion.  Still appears to be uncontrolled for the most part.  Enteritis seems to have resolved.  He was started on anticoagulation which I think at this point is quite reasonable, he appears a lot younger than stated age and does appear to be robust.  However he has gait instability and therefore PT has been consulted.  PT recommended patient could be discharged home with home health PT and aide.  -Patient is to decide for possible cardioversion in the morning of 08/19/2019  Resolved Hospital Problem list     DVT prophylaxis: apixaban Code Status: Full Family Communication: none Disposition Plan: hom    Thornell Mule, MD  [facility] Direct contact: see [email or web address] (further directions at bottom of note if needed) 7PM-7AM contact night coverage as at bottom of note 08/18/2019, 2:46 PM  LOS: 4 days   Significant Hospital Events   . 1/1 admitted for abdominal pain, vomiting, atrial fibrillation with rapid ventricular response.  CT showed enteritis.   Consults:  .    Procedures:  .   Significant Diagnostic Tests:  . 12/31 EKG atrial fibrillation with rapid ventricular response, LVH with repolarization abnormalities.  Anteroseptal MI, old.  No significant change compared to last test 10/2018 . 1/1 CT abdomen pelvis: Multiple fluid-filled small bowel loops with mesenteric edema suspicious for enteritis, likely infectious or inflammatory.  Ischemia not felt to be likely. . 1/1 echocardiogram LVEF 25-30%.  Left ventricular diastolic parameters indeterminate, mildly reduced systolic function   Micro Data:  . Point-of-care SARS Covid  antigen negative.  No confirmatory test ordered. . C. difficile negative   Antimicrobials:  .   Interval History/Subjective  Lying on bed this morning.  Patient wanted to go home.  However his heart rate remained above 90s or low 100s.  Objective   Vitals:  Vitals:   08/18/19 1223 08/18/19 1444  BP:  (!) 128/102  Pulse: 93 74  Resp:  (!) 22 18  Temp:  (!) 97.3 F (36.3 C)  SpO2: 100% 98%    Exam:  Constitutional.  Appears calm, comfortable.  Appears 58 years younger than stated age. Respiratory.  Clear to auscultation bilaterally.  No wheezes, rales or rhonchi.  Normal respiratory effort. Cardiovascular.  Tachycardic and irregular rhythm.  No murmur, rub or gallop.  No lower extremity edema. Psychiatric.  Grossly normal mood and affect.  Speech fluent and appropriate. GU.  External hemorrhoids visualized.  Anus appears unremarkable.  Rectal exam unremarkable.  Stool brown.  I have personally reviewed the following:   Today's Data  . Creatinine stable, appears to be at baseline, 1.59 . CBC unremarkable  Scheduled Meds: . apixaban  2.5 mg Oral BID  . aspirin EC  81 mg Oral Daily  . furosemide  40 mg Intravenous Once  . metoprolol succinate  100 mg Oral BID  . multivitamin with minerals   Oral Daily   Continuous Infusions:   Active Problems:   CKD (chronic kidney disease), stage III   Aortic stenosis   CHF (congestive heart failure) (HCC)   Acute kidney injury (AKI) with acute tubular necrosis (ATN) (HCC)   Enteritis   Gait instability   Acute kidney injury (HCC)   Atrial fibrillation with rapid ventricular response (HCC)   Abdominal pain   Acute respiratory distress   LOS: 4 days   How to contact the [facility] Attending or Consulting  provider [place] or covering provider during after hours [place], for this patient?  1. Check the care team in [facility] and look for a) attending/consulting [facility] provider listed and b) the [facility] team listed 2. Log into [email or web address] and use [place]'s universal password to access. If you do not have the password, please contact the hospital operator. 3. Locate the [facility] provider you are looking for under [facility] and page to a number that you can be directly reached. 4. If you still have difficulty reaching the provider, please page the [facility] (Director on Call) for the  Hospitalists listed on amion for assistance.   Time spent greater than 35 minutes, greater than 50% counseling, coordination of care.

## 2019-08-19 NOTE — Anesthesia Preprocedure Evaluation (Addendum)
Anesthesia Evaluation  Patient identified by MRN, date of birth, ID band Patient awake    Reviewed: Allergy & Precautions, H&P , NPO status , Patient's Chart, lab work & pertinent test results, reviewed documented beta blocker date and time   History of Anesthesia Complications Negative for: history of anesthetic complications  Airway Mallampati: II  TM Distance: >3 FB Neck ROM: full    Dental  (+) Dental Advidsory Given, Poor Dentition, Missing, Chipped   Pulmonary shortness of breath and with exertion, neg COPD, neg recent URI, former smoker,           Cardiovascular Exercise Tolerance: Good hypertension, (-) angina+ CAD and +CHF  (-) Past MI, (-) Cardiac Stents and (-) CABG + dysrhythmias Atrial Fibrillation + Valvular Problems/Murmurs      Neuro/Psych negative neurological ROS  negative psych ROS   GI/Hepatic negative GI ROS, Neg liver ROS,   Endo/Other  neg diabetesMorbid obesity  Renal/GU CRFRenal disease  negative genitourinary   Musculoskeletal   Abdominal   Peds  Hematology negative hematology ROS (+)   Anesthesia Other Findings Past Medical History: No date: CAD (coronary artery disease)     Comment:  L&RHC 12/23/2014 elevated LVEDP, prox LAD 45%, 60% ramus No date: CKD (chronic kidney disease), stage III No date: Essential hypertension No date: Murmur No date: OA (osteoarthritis)     Comment:  a. 2011 s/p bilat TKA. No date: Ventricular bigeminy     Comment:  a. 12/2014. Started on amio on 12/24/2014   Reproductive/Obstetrics negative OB ROS                            Anesthesia Physical Anesthesia Plan  ASA: III  Anesthesia Plan: General   Post-op Pain Management:    Induction: Intravenous  PONV Risk Score and Plan: 2 and Propofol infusion and TIVA  Airway Management Planned: Natural Airway and Nasal Cannula  Additional Equipment:   Intra-op Plan:    Post-operative Plan:   Informed Consent: I have reviewed the patients History and Physical, chart, labs and discussed the procedure including the risks, benefits and alternatives for the proposed anesthesia with the patient or authorized representative who has indicated his/her understanding and acceptance.     Dental Advisory Given  Plan Discussed with: Anesthesiologist, CRNA and Surgeon  Anesthesia Plan Comments:         Anesthesia Quick Evaluation

## 2019-08-19 NOTE — CV Procedure (Signed)
Cardioversion procedure note For atrial fibrillation, persistent  Procedure Details:  Consent: Risks of procedure as well as the alternatives and risks of each were explained to the (patient/caregiver). Consent for procedure obtained.  Time Out: Verified patient identification, verified procedure, site/side was marked, verified correct patient position, special equipment/implants available, medications/allergies/relevent history reviewed, required imaging and test results available. Performed  Patient placed on cardiac monitor, pulse oximetry, supplemental oxygen as necessary.  Sedation given: propofol IV, Dr. Raphael Gibney Pacer pads placed anterior and posterior chest.   Cardioverted 1 time(s).  Cardioverted at  150 J. Synchronized biphasic Converted to NSR   Evaluation: Findings: Post procedure EKG shows: NSR Complications: None Patient did tolerate procedure well.  Time Spent Directly with the Patient:  58 minutes   Esmond Plants, M.D., Ph.D.

## 2019-08-19 NOTE — Progress Notes (Signed)
Progress Note  Patient Name: Clinton Holmes Date of Encounter: 08/19/2019  Primary Cardiologist: Fletcher Anon  Subjective   SOB today, rates in the 100+ bpm, atrial fib Underwent TEE and cardioversion this AM, Severe aortic valve stenosis, severely depressed EF in atrial fib   Inpatient Medications    Scheduled Meds: . [MAR Hold] apixaban  2.5 mg Oral BID  . [MAR Hold] aspirin EC  81 mg Oral Daily  . butamben-tetracaine-benzocaine      . furosemide  40 mg Intravenous Q12H  . lidocaine      . [MAR Hold] metoprolol succinate  100 mg Oral BID  . [MAR Hold] multivitamin with minerals   Oral Daily  . sodium chloride flush       Continuous Infusions: . sodium chloride 20 mL/hr at 08/19/19 0700   PRN Meds: [MAR Hold] acetaminophen **OR** [MAR Hold] acetaminophen, [MAR Hold] magnesium hydroxide, [MAR Hold] ondansetron **OR** [MAR Hold] ondansetron (ZOFRAN) IV, [MAR Hold] traZODone   Vital Signs    Vitals:   08/19/19 0903 08/19/19 0904 08/19/19 0905 08/19/19 0929  BP:    93/69  Pulse: 67 89 99   Resp: 13 12 11 18   Temp:      TempSrc:      SpO2: 97% 97% 98% 97%  Weight:      Height:        Intake/Output Summary (Last 24 hours) at 08/19/2019 0935 Last data filed at 08/19/2019 [id number] Gross per 24 hour  Intake 521.81 ml  Output 750 ml  Net -228.19 ml   Filed Weights   08/13/19 1800 08/18/19 0314  Weight: 109.3 kg 131.1 kg    Telemetry    Now in NSR after cardioversion- Personally Reviewed  ECG    No new tracings - Personally Reviewed  Physical Exam   Constitutional:  oriented to person, place, and time. No distress. Hard of hearing HENT:  Head: Grossly normal Eyes:  no discharge. No scleral icterus.  Neck: No JVD, no carotid bruits  Cardiovascular: Regular rate and rhythm, no murmurs appreciated Pulmonary/Chest: Coarse breath sounds b/l Abdominal: Soft.  no distension.  no tenderness.  Musculoskeletal: Normal range of motion Neurological:  normal muscle tone.  Coordination normal. No atrophy Skin: Skin warm and dry Psychiatric: normal affect, pleasant   Labs    Chemistry Recent Labs  Lab 08/13/19 1809 08/17/19 0753 08/18/19 0339 08/19/19 0755  NA 141 138 138 139  K 4.3 4.8 4.4 4.1  CL 103 104 104 103  CO2 25 21* 27 27  GLUCOSE 133* 119* 105* 94  BUN 30* 38* 39* 39*  CREATININE 1.76* 1.98* 2.00* 1.93*  CALCIUM 10.0 9.7 9.6 9.4  PROT 7.4  --   --   --   ALBUMIN 4.7  --   --   --   AST 30  --   --   --   ALT 20  --   --   --   ALKPHOS 61  --   --   --   BILITOT 1.2  --   --   --   GFRNONAA 34* 29* 29* 30*  GFRAA 39* 34* 33* 35*  ANIONGAP 13 13 7 9      Hematology Recent Labs  Lab 08/15/19 0520 08/16/19 2008 08/17/19 0753  WBC 7.6 11.5* 10.8*  RBC 4.48 4.66 4.79  HGB 12.6* 13.6 13.5  HCT 41.3 40.9 43.5  MCV 92.2 87.8 90.8  MCH 28.1 29.2 28.2  MCHC 30.5 33.3 31.0  RDW 15.2 15.3 15.2  PLT 157 181 171    Cardiac EnzymesNo results for input(s): TROPONINI in the last 168 hours. No results for input(s): TROPIPOC in the last 168 hours.   BNP Recent Labs  Lab 08/16/19 2007  BNP 2,308.0*     DDimer No results for input(s): DDIMER in the last 168 hours.   Radiology    DG Chest Port 1 View  Result Date: 08/16/2019 IMPRESSION: Cardiomegaly with mild, diffuse interstitial pulmonary opacity and small, layering bilateral pleural effusions, likely edema. Atypical/viral infection is a differential consideration. Electronically Signed   By: Eddie Candle M.D.   On: 08/16/2019 20:35    Cardiac Studies   2D Echo 08/14/2019: 1. Left ventricular ejection fraction, by visual estimation, is 25 to 30%. The left ventricle has severely decreased function. There is mildly increased left ventricular hypertrophy. 2. Left ventricular diastolic parameters are indeterminate. 3. The left ventricle demonstrates global hypokinesis. 4. Global right ventricle has mildly reduced systolic function.The right ventricular size is normal. No  increase in right ventricular wall thickness. 5. Left atrial size was mildly dilated. 6. Right atrial size was normal. 7. The mitral valve is grossly normal. Trivial mitral valve regurgitation. 8. The tricuspid valve is normal in structure. 9. Aortic valve area, by VTI measures 0.65 cm. 10. Aortic valve mean gradient measures 22.0 mmHg. 11. Aortic valve peak gradient measures 41.5 mmHg. 12. The aortic valve is abnormal. Aortic valve regurgitation is not visualized. Severe aortic valve stenosis. 13. The pulmonic valve was not well visualized. Pulmonic valve regurgitation is not visualized. 14. Moderately elevated pulmonary artery systolic pressure. 15. The inferior vena cava is dilated in size with <50% respiratory variability, suggesting right atrial pressure of 15 mmHg. __________  24-hour Holter 07/2018: Normal sinus rhythm,  Avg rate 64 bpm  Rare PAC (<1%)  Frequent Ventricular beats (35%), total of 31, 721  23K isolated beats 1,134 couplets 13,950 bigeminal beats NSVT, longest run 26 beats, 1,037 runs total (5,740 beats)  Patient Profile     86 y.o. male with history of mild nonobstructive CAD by LHC in 2016, HFrEF secondary to NICM, moderate to severe aortic stenosis, pulmonary hypertension, possible TIA/CVA with left visual changes, acoustic neuroma s/p gamma knife procedure at [facility] in 1990s, syncope, CKD stage III, frequent PVCs, HTN, and HLD who is being seen today for the evaluation of new onset Afib with RVR.  Assessment & Plan    1. New onset Afib with RVR:  in the setting of acute enteritis  -With cardiomyopathy and underlying severe aortic stenosis Secondary to respiratory distress, he underwent TEE-guided DCCV this Am, NSR restored.  Continue metoprolol, eliquis -CHADS2VASc at least 7 (CHF, HTN, age x 2, TIA/CVA x 2, vascular disease)  2. Elevated troponin/nonobstructive CAD: -Minimally elevated at 19 with an unchanged delta -Not consistent with ACS  -Likely supply demand ischemia in the setting of acute enteritis and acute on CKD -Prior cath with nonobstructive disease --no further ischemic workup at this time  3. HFrEF secondary to NICM/pulmonary hypertension: -Further reduction in LVSF this admission, possibly tachymediated vs in the setting of his aortic valve disease  -Not currently on ACE-I/ARB/spironolactone/Entresto secondary to acute on CKD  ----need repeat limited echo in a few weeks while in NSR to evaluate EF -continue metoprolol Extra lasix today for continued SOB  4. Aortic stenosis: -Severe by echo , both TTE and TEE Will need expedited TAVR work up  5. Possible TIA/CVA: -Transitioned from Plavix to Eliquis and ASA  this admission, consider stopping ASA -Intolerant to low dose Crestor and has declined rechallenge of statins  6. Acute on CKD stage III: -Renal function stable -Lisinopril held Lasix today for systolic CHF symptoms  7. Abdominal aortic aneurysm: -Outpatient follow up   9. Enteritis/hematochezia: -He will need close monitoring  Now on NOAC, post cardioversion   Total encounter time more than 25 minutes  Greater than 50% was spent in counseling and coordination of care with the patient   For questions or updates, please contact [place] Please consult [email or web address] for contact info under Cardiology/STEMI.    Signed, Esmond Plants, MD, Ph.D [facility]

## 2019-08-19 NOTE — Progress Notes (Signed)
*  PRELIMINARY RESULTS* Echocardiogram Echocardiogram Transesophageal has been performed.  Clinton Holmes 08/19/2019, 9:31 AM

## 2019-08-19 NOTE — Progress Notes (Signed)
PROGRESS NOTE    DREU TISCHNER  [id number] DOB: April 22, 1928 DOA: 08/14/2019 PCP: Philmore Pali, NP    Brief Narrative:  86 year old man PMH CKD stage III, CAD presenting with acute abdominal pain left lower quadrant, vomiting without diarrhea.  CT suggestive of enteritis.  Admitted for acute abdominal pain with acute enteritis, atrial fibrillation with rapid ventricular response.   Consultants:   Cardiology  Procedures: TEE cardioversion 08-18-2018 CT of abdomen and pelvis Echo  Antimicrobials:   None   Subjective: Patient seen and examined.  Status post TEE cardioversion today.  Daughter at bedside.  Patient denies chest pain, states shortness of breath is little better however he is a little breathless when talking to me.  Denies dizziness or any other symptoms.  Objective: Vitals:   08/19/19 0930 08/19/19 0945 08/19/19 1000 08/19/19 1103  BP: 106/67 110/61 109/67 130/79  Pulse: (!) 42 (!) 57 (!) 37 (!) 58  Resp: 15 (!) 28 (!) 21 20  Temp:    98.3 F (36.8 C)  TempSrc:    Oral  SpO2: 96% 98% 94% 95%  Weight:      Height:        Intake/Output Summary (Last 24 hours) at 08/19/2019 1410 Last data filed at 08/19/2019 0928 Gross per 24 hour  Intake 521.81 ml  Output 650 ml  Net -128.19 ml   Filed Weights   08/13/19 1800 08/18/19 0314  Weight: 109.3 kg 131.1 kg    Examination:  General exam: Appears calm and comfortable  Respiratory system: Minimal scattered bilateral rales cardiovascular system: S1 & S2 heard, RRR. No  murmurs, rubs, gallops or clicks.  Gastrointestinal system: Abdomen is nondistended, soft and nontender. Normal bowel sounds heard. Central nervous system: Alert and oriented x3 no focal neurological deficits. Extremities: No edema. Psychiatry: Judgement and insight appear normal. Mood & affect appropriate.     Data Reviewed: I have personally reviewed following labs and imaging studies  CBC: Recent Labs  Lab 08/13/19 1809 08/15/19 0520  08/16/19 2008 08/17/19 0753  WBC 8.8 7.6 11.5* 10.8*  NEUTROABS  --   --   --  8.9*  HGB 13.9 12.6* 13.6 13.5  HCT 44.6 41.3 40.9 43.5  MCV 90.1 92.2 87.8 90.8  PLT 177 157 181 [id number]   Basic Metabolic Panel: Recent Labs  Lab 08/15/19 0520 08/16/19 2008 08/17/19 0753 08/18/19 0339 08/19/19 0755  NA 140 139 138 138 139  K 4.4 4.8 4.8 4.4 4.1  CL 105 107 104 104 103  CO2 26 19* 21* 27 27  GLUCOSE 93 188* 119* 105* 94  BUN 28* 34* 38* 39* 39*  CREATININE 1.59* 2.09* 1.98* 2.00* 1.93*  CALCIUM 9.5 9.4 9.7 9.6 9.4  MG  --  2.4  --   --   --    GFR: Estimated Creatinine Clearance: 36.3 mL/min (A) (by C-G formula based on SCr of 1.93 mg/dL (H)). Liver Function Tests: Recent Labs  Lab 08/13/19 1809  AST 30  ALT 20  ALKPHOS 61  BILITOT 1.2  PROT 7.4  ALBUMIN 4.7   Recent Labs  Lab 08/13/19 1809  LIPASE 28   No results for input(s): AMMONIA in the last 168 hours. Coagulation Profile: Recent Labs  Lab 08/14/19 0624 08/18/19 1721 08/19/19 0545  INR 1.2 1.6* 1.6*   Cardiac Enzymes: No results for input(s): CKTOTAL, CKMB, CKMBINDEX, TROPONINI in the last 168 hours. BNP (last 3 results) No results for input(s): PROBNP in the last 8760 hours. HbA1C: No  results for input(s): HGBA1C in the last 72 hours. CBG: Recent Labs  Lab 08/16/19 2004  GLUCAP 161*   Lipid Profile: No results for input(s): CHOL, HDL, LDLCALC, TRIG, CHOLHDL, LDLDIRECT in the last 72 hours. Thyroid Function Tests: No results for input(s): TSH, T4TOTAL, FREET4, T3FREE, THYROIDAB in the last 72 hours. Anemia Panel: No results for input(s): VITAMINB12, FOLATE, FERRITIN, TIBC, IRON, RETICCTPCT in the last 72 hours. Sepsis Labs: No results for input(s): PROCALCITON, LATICACIDVEN in the last 168 hours.  Recent Results (from the past 240 hour(s))  C difficile quick scan w PCR reflex     Status: None   Collection Time: 08/14/19  2:07 PM   Specimen: STOOL  Result Value Ref Range Status   C Diff  antigen NEGATIVE NEGATIVE Final   C Diff toxin NEGATIVE NEGATIVE Final   C Diff interpretation No C. difficile detected.  Final    Comment: Performed at [facility], [address] [place] [address]  GI pathogen panel by PCR, stool     Status: None   Collection Time: 08/14/19  2:07 PM   Specimen: Stool  Result Value Ref Range Status   Plesiomonas shigelloides NOT DETECTED NOT DETECTED Final   Yersinia enterocolitica NOT DETECTED NOT DETECTED Final   Vibrio NOT DETECTED NOT DETECTED Final   Enteropathogenic E coli NOT DETECTED NOT DETECTED Final   E coli (ETEC) LT/ST NOT DETECTED NOT DETECTED Final   E coli [id number] by PCR Not applicable NOT DETECTED Final   Cryptosporidium by PCR NOT DETECTED NOT DETECTED Final   Entamoeba histolytica NOT DETECTED NOT DETECTED Final   Adenovirus F 40/41 NOT DETECTED NOT DETECTED Final   Norovirus GI/GII NOT DETECTED NOT DETECTED Final   Sapovirus NOT DETECTED NOT DETECTED Final    Comment: (NOTE) Performed At: [facility] [place], [place] [id number] Rush Farmer MD [id number]    Vibrio cholerae NOT DETECTED NOT DETECTED Final   Campylobacter by PCR NOT DETECTED NOT DETECTED Final   Salmonella by PCR NOT DETECTED NOT DETECTED Final   E coli (STEC) NOT DETECTED NOT DETECTED Final   Enteroaggregative E coli NOT DETECTED NOT DETECTED Final   Shigella by PCR NOT DETECTED NOT DETECTED Final   Cyclospora cayetanensis NOT DETECTED NOT DETECTED Final   Astrovirus NOT DETECTED NOT DETECTED Final   G lamblia by PCR NOT DETECTED NOT DETECTED Final   Rotavirus A by PCR NOT DETECTED NOT DETECTED Final  SARS CORONAVIRUS 2 (TAT 6-24 HRS) Nasopharyngeal Nasopharyngeal Swab     Status: None   Collection Time: 08/16/19 11:27 PM   Specimen: Nasopharyngeal Swab  Result Value Ref Range Status   SARS Coronavirus 2 NEGATIVE NEGATIVE Final    Comment: (NOTE) SARS-CoV-2 target nucleic acids are NOT DETECTED. The SARS-CoV-2  RNA is generally detectable in upper and lower respiratory specimens during the acute phase of infection. Negative results do not preclude SARS-CoV-2 infection, do not rule out co-infections with other pathogens, and should not be used as the sole basis for treatment or other patient management decisions. Negative results must be combined with clinical observations, patient history, and epidemiological information. The expected result is Negative. Fact Sheet for Patients: SugarRoll.be Fact Sheet for Healthcare Providers: https://www.woods-mathews.com/ This test is not yet approved or cleared by the [place] FDA and  has been authorized for detection and/or diagnosis of SARS-CoV-2 by FDA under an Emergency Use Authorization (EUA). This EUA will remain  in effect (meaning this test can  be used) for the duration of the COVID-19 declaration under Section 56 4(b)(1) of the Act, 21 U.S.C. section 360bbb-3(b)(1), unless the authorization is terminated or revoked sooner. Performed at [facility], [place] [address] [place], [place] [address]          Radiology Studies: No results found.      Scheduled Meds: . apixaban  2.5 mg Oral BID  . aspirin EC  81 mg Oral Daily  . butamben-tetracaine-benzocaine      . furosemide      . furosemide  40 mg Intravenous Q12H  . lidocaine      . metoprolol succinate  100 mg Oral BID  . multivitamin with minerals   Oral Daily   Continuous Infusions:  Assessment & Plan:   Active Problems:   CKD (chronic kidney disease), stage III   Aortic stenosis   CHF (congestive heart failure) (HCC)   Acute kidney injury (AKI) with acute tubular necrosis (ATN) (HCC)   Enteritis   Gait instability   Acute kidney injury (HCC)   Atrial fibrillation with rapid ventricular response (HCC)   Abdominal pain   Acute respiratory distress   Acute on chronic systolic heart failure (HCC)  Acute enteritis with  associated abdominal pain and vomiting, differential infectious, inflammatory, less likely ischemic based on patency of mesenteric vessels. Afebrile Stool studies negative Resolved  Atrial fibrillation with rapid ventricular response:  S/p TEE CV On Eliquis with CHA2DS2-VASc 7. Continue Toprol-XL 100 mg twice daily     Reported blood with bowel movement.  No new episode so far here  - patient reports for some time now he has had a little bit of blood with hard bowel movements.  He is not aware of hemorrhoids in the past.  He has not seen a GI doctor. --External exam reveals external hemorrhoids.  Rectal exam is unremarkable.  Lab will not send stool card to examiner's I was unable to perform guaiac myself.  However order has been placed for stool guaiac.  I do not suspect significant bleeding.    Gait instability at home, uses a walker, no falls but reports unsteadiness. --PT evaluated recommended home health with PT and aide  -Case manager arranging.  Chronic combined systolic, diastolic CHF secondary to NICM followed by cardiology [facility], sec to HTN heart disease, aortic stenosis and PVCs. -Remains stable/euvolemic -  echocardiogram this admission LVEF 25-30%, global hypokinesis.  Ventricular diastolic parameters left ventricle indeterminate.  Mildly reduced RV function systolic.  Currently mildly volume overloaded.  Cards started on lasix iv Strict I/o - Continue furosemide,metoprolol succinate.  acei on hold   Severe aortic stenosis seen on echocardiogram 08/14/2019 and 07/25/2018. Discussed with cards about TAVR. Once more euvolemic need to address this on this admission [facility]. [facility] vs as outpt.  CAD/Elevated troponin-, last cath 2016 widely patent coronary arteries, mild global left ventricular hypokinesis Likely 2/2 demand ischemia in the setting of acute enteritis and acute on chronic kidney disease and A. fib RVR cardiology following.  This is not consistent  with ACS.  AKI superimposed on CKD stage IIIb --Appears to be at baseline now.  Abdominal aortic aneurysm 3.7 cm --Outpatient ultrasound in 2 years  PMH stroke w/ left eye impact --Continue statin.  Plavix stopped as the patient is now on anticoagulation.  Aortic atherosclerosis, asymptomatic --Continue statin  6 mm right lower lobe nodular opacity seen on chest CT 10/2018 during chart review.  Recommend outpatient follow-up with consideration given to CAT  scan in the next 3 months, by March.   DVT prophylaxis: Eliquis Code Status: Full Family Communication: Daughter at bedside updated all questions and concerns addressed Disposition Plan: When medically stable will DC home in couple of days      LOS: 5 days   Time spent: 45 minutes with more than 50% COC    Nolberto Hanlon, MD [facility] Pager [phone]  If 7PM-7AM, please contact night-coverage [email or web address] Password [email or web address] 08/19/2019, 2:10 PM

## 2019-08-20 LAB — BASIC METABOLIC PANEL
Anion gap: 9 (ref 5–15)
BUN: 37 mg/dL — ABNORMAL HIGH (ref 8–23)
CO2: 31 mmol/L (ref 22–32)
Calcium: 9.6 mg/dL (ref 8.9–10.3)
Chloride: 101 mmol/L (ref 98–111)
Creatinine, Ser: 1.87 mg/dL — ABNORMAL HIGH (ref 0.61–1.24)
GFR calc Af Amer: 36 mL/min — ABNORMAL LOW (ref >60)
GFR calc non Af Amer: 31 mL/min — ABNORMAL LOW (ref >60)
Glucose, Bld: 99 mg/dL (ref 70–99)
Potassium: 4.1 mmol/L (ref 3.5–5.1)
Sodium: 141 mmol/L (ref 135–145)

## 2019-08-20 LAB — CBC
HCT: 41.2 % (ref 39.0–52.0)
Hemoglobin: 12.8 g/dL — ABNORMAL LOW (ref 13.0–17.0)
MCH: 28.8 pg (ref 26.0–34.0)
MCHC: 31.1 g/dL (ref 30.0–36.0)
MCV: 92.6 fL (ref 80.0–100.0)
Platelets: 163 10*3/uL (ref 150–400)
RBC: 4.45 MIL/uL (ref 4.22–5.81)
RDW: 15.1 % (ref 11.5–15.5)
WBC: 9.2 10*3/uL (ref 4.0–10.5)
nRBC: 0 % (ref 0.0–0.2)

## 2019-08-20 NOTE — Progress Notes (Signed)
[place] for apixaban Indication: atrial fibrillation  No Known Allergies  Patient Measurements: Height: 6' (182.9 cm) Weight: 244 lb 7.8 oz (110.9 kg) IBW/kg (Calculated) : 77.6   Vital Signs: Temp: 98.7 F (37.1 C) (01/07 0718) Temp Source: Oral (01/07 0718) BP: 121/74 (01/07 0951) Pulse Rate: 59 (01/07 0951)  Labs: Recent Labs    08/18/19 0339 08/18/19 1721 08/19/19 0545 08/19/19 0755 08/20/19 0304  HGB  --   --   --   --  12.8*  HCT  --   --   --   --  41.2  PLT  --   --   --   --  163  LABPROT  --  19.0* 18.6*  --   --   INR  --  1.6* 1.6*  --   --   CREATININE 2.00*  --   --  1.93* 1.87*    Estimated Creatinine Clearance: 34.4 mL/min (A) (by C-G formula based on SCr of 1.87 mg/dL (H)).  Assessment: 86 yo male here with AFib with RVR continuing apixaban.   Goal of Therapy:  Monitor platelets by anticoagulation protocol: Yes   Plan:  Apixaban 2.5 mg BID (age >41, SCr 1.87) Will need Scr and CBC at least every three days per policy  Pharmacy will continue to follow.   Lu Duffel, PharmD, BCPS Clinical Pharmacist 08/20/2019 11:10 AM

## 2019-08-20 NOTE — Progress Notes (Signed)
Physical Therapy Treatment Patient Details Name: Clinton Holmes MRN: [id number] DOB: 1928-07-12 Today's Date: 08/20/2019    History of Present Illness 86 year old male with PMH of CKD, CAD, HTN, murmur, OA and ventricular bigeminy presented to ED on 08/13/2018 with complaints of abdominal pain, nausea, vomiting and loose stools.  A CT scan of the abdomen was performed which revealed evidence of enteritis.  Patient also noted to be in atrial fibrillation with rapid ventricular response in the emergency department and as such IV metoprolol 5 mg was administered with rate control.    PT Comments    Pt is able to ambulate ~100 ft today with heavy reliant on walker but no O2.  He was eager to get up and do something, yet remained at times agitated generally about being in the hospital and not being able to get up on his own.  Pt feels ready to go home and is eager to do so.  Pt with multiple sit to stand attempts today w/o need for direct assist and again did ambulate on room air for in home distances.  Follow Up Recommendations  Home health PT;Supervision/Assistance - 24 hour     Equipment Recommendations  Rolling walker with 5" wheels    Recommendations for Other Services       Precautions / Restrictions Precautions Precautions: Fall Restrictions Weight Bearing Restrictions: No    Mobility  Bed Mobility Overal bed mobility: Needs Assistance Bed Mobility: Supine to Sit     Supine to sit: Min guard     General bed mobility comments: Pt with minor struggle to fully get himself up to sitting, but did eventually get squared up to EOB w/o direct assist  Transfers Overall transfer level: Needs assistance Equipment used: Rolling walker (2 wheeled) Transfers: Sit to/from Stand Sit to Stand: Min guard         General transfer comment: Pt able to rise to standing on 3 seperate occasions during session, each without assist but needing extra time/effort using UEs to rise to  standing  Ambulation/Gait Ambulation/Gait assistance: Min guard Gait Distance (Feet): 100 Feet Assistive device: Rolling walker (2 wheeled)       General Gait Details: Pt was able to ambulate slowly but consistently around the pod with walker and on room air.  O2 and HR remained in the 90s t/o the effort and despite some fatigue he ultimately did well and did not have any overt safety issues.    Stairs             Wheelchair Mobility    Modified Rankin (Stroke Patients Only)       Balance Overall balance assessment: Modified Independent Sitting-balance support: No upper extremity supported Sitting balance-Leahy Scale: Good Sitting balance - Comments: able to sit confidently at EOB     Standing balance-Leahy Scale: Fair Standing balance comment: Pt reliant on UEs/AD but able to maintain standing balance w/o issue                            Cognition Arousal/Alertness: Awake/alert Behavior During Therapy: WFL for tasks assessed/performed;Agitated Overall Cognitive Status: Within Functional Limits for tasks assessed                                        Exercises      General Comments  Pertinent Vitals/Pain Pain Assessment: No/denies pain    Home Living                      Prior Function            PT Goals (current goals can now be found in the care plan section) Progress towards PT goals: Progressing toward goals    Frequency    Min 2X/week      PT Plan Current plan remains appropriate    Co-evaluation              AM-PAC PT "6 Clicks" Mobility   Outcome Measure  Help needed turning from your back to your side while in a flat bed without using bedrails?: None Help needed moving from lying on your back to sitting on the side of a flat bed without using bedrails?: A Little Help needed moving to and from a bed to a chair (including a wheelchair)?: A Little Help needed standing up from a  chair using your arms (e.g., wheelchair or bedside chair)?: A Little Help needed to walk in hospital room?: A Little Help needed climbing 3-5 steps with a railing? : A Little 6 Click Score: 19    End of Session Equipment Utilized During Treatment: Gait belt(on room air t/o session, O2 in the 90s t/o) Activity Tolerance: Patient limited by fatigue;Patient tolerated treatment well Patient left: in chair;with chair alarm set Nurse Communication: Mobility status PT Visit Diagnosis: Unsteadiness on feet (R26.81);History of falling (Z91.81);Muscle weakness (generalized) (M62.81)     Time: [id number] PT Time Calculation (min) (ACUTE ONLY): 28 min  Charges:  $Gait Training: 8-22 mins $Therapeutic Activity: 8-22 mins                     Kreg Shropshire, DPT 08/20/2019, 1:36 PM

## 2019-08-20 NOTE — Progress Notes (Signed)
Central tele contacted this writer is regards to pt's HR/Rthyme, this Probation officer checked on the pt. Pt shows no signs of distress, pt was sleeping. Pt reports not chx pain, no SOB, not symptomatic. The message was relayed to MD. MD is not concerned, no orders were place.

## 2019-08-20 NOTE — Progress Notes (Signed)
D: Pt alert and oriented x 4. Pt denies experiencing any pain at this time. Pt voices desire to go home today. Pt understands he needs to get better first. Pt can be forget at times however is easily redirected. IV access had to be wrapped, pt will pull at gauze and IV access. Pt OOB w/ PT today.  A: Scheduled medications administered to pt, per MD orders. Support and encouragement provided. Frequent verbal contact made.   R: No adverse drug reactions noted. Pt complaint with medications and treatment plan. Pt interacts well with staff on the unit. Pt is stable at this time, will continue to monitor and provide care for as ordered.

## 2019-08-20 NOTE — Progress Notes (Signed)
Progress Note  Patient Name: [name] Date of Encounter: 08/20/2019  Primary Cardiologist: Fletcher Anon  Subjective   Successful transesophageal echo yesterday with cardioversion, normal sinus rhythm restored Maintain normal sinus rhythm overnight  Daughter at the bedside He reports feeling somewhat better, " I am going home today" Has had good urination through yesterday and today on IV Lasix Discussed with nursing, reports that he has walked down the hallway Still on 2 L nasal cannula oxygen PT note reviewed, ambulated 100 feet with heavy reliance on walker, no oxygen Was noted to be agitated   Inpatient Medications    Scheduled Meds: . apixaban  2.5 mg Oral BID  . aspirin EC  81 mg Oral Daily  . furosemide  40 mg Intravenous Q12H  . metoprolol succinate  100 mg Oral BID  . multivitamin with minerals   Oral Daily   Continuous Infusions:  PRN Meds: acetaminophen **OR** acetaminophen, magnesium hydroxide, ondansetron **OR** ondansetron (ZOFRAN) IV, traZODone   Vital Signs    Vitals:   08/19/19 2319 08/20/19 0500 08/20/19 0718 08/20/19 0951  BP: 114/80  120/76 121/74  Pulse: 61  61 (!) 59  Resp: 20  18   Temp: 98.8 F (37.1 C)  98.7 F (37.1 C)   TempSrc:   Oral   SpO2: 98%  98% 95%  Weight:  110.9 kg    Height:        Intake/Output Summary (Last 24 hours) at 08/20/2019 1346 Last data filed at 08/20/2019 1040 Gross per 24 hour  Intake 0 ml  Output 1375 ml  Net -1375 ml   Filed Weights   08/13/19 1800 08/18/19 0314 08/20/19 0500  Weight: 109.3 kg 131.1 kg 110.9 kg    Telemetry    Normal sinus rhythm personally Reviewed  ECG    No new tracings - Personally Reviewed  Physical Exam   Constitutional: Mildly agitated , oriented to person, place, and time. No distress.  HENT:  Head: Grossly normal Eyes:  no discharge. No scleral icterus.  Neck: No JVD, no carotid bruits  Cardiovascular: Regular rate and rhythm, no murmurs appreciated  Pulmonary/Chest: Scattered Rales Abdominal: Soft.  no distension.  no tenderness.  Musculoskeletal: Normal range of motion Neurological:  normal muscle tone. Coordination normal. No atrophy Skin: Skin warm and dry Psychiatric: normal affect, pleasant   Labs    Chemistry Recent Labs  Lab 08/13/19 1809 08/18/19 0339 08/19/19 0755 08/20/19 0304  NA 141 138 139 141  K 4.3 4.4 4.1 4.1  CL 103 104 103 101  CO2 25 27 27 31   GLUCOSE 133* 105* 94 99  BUN 30* 39* 39* 37*  CREATININE 1.76* 2.00* 1.93* 1.87*  CALCIUM 10.0 9.6 9.4 9.6  PROT 7.4  --   --   --   ALBUMIN 4.7  --   --   --   AST 30  --   --   --   ALT 20  --   --   --   ALKPHOS 61  --   --   --   BILITOT 1.2  --   --   --   GFRNONAA 34* 29* 30* 31*  GFRAA 39* 33* 35* 36*  ANIONGAP 13 7 9 9      Hematology Recent Labs  Lab 08/16/19 2008 08/17/19 0753 08/20/19 0304  WBC 11.5* 10.8* 9.2  RBC 4.66 4.79 4.45  HGB 13.6 13.5 12.8*  HCT 40.9 43.5 41.2  MCV 87.8 90.8 92.6  MCH  29.2 28.2 28.8  MCHC 33.3 31.0 31.1  RDW 15.3 15.2 15.1  PLT 181 171 163    Cardiac EnzymesNo results for input(s): TROPONINI in the last 168 hours. No results for input(s): TROPIPOC in the last 168 hours.   BNP Recent Labs  Lab 08/16/19 2007  BNP 2,308.0*     DDimer No results for input(s): DDIMER in the last 168 hours.   Radiology    DG Chest Port 1 View  Result Date: 08/16/2019 IMPRESSION: Cardiomegaly with mild, diffuse interstitial pulmonary opacity and small, layering bilateral pleural effusions, likely edema. Atypical/viral infection is a differential consideration. Electronically Signed   By: Eddie Candle M.D.   On: 08/16/2019 20:35    Cardiac Studies   2D Echo 08/14/2019: 1. Left ventricular ejection fraction, by visual estimation, is 25 to 30%. The left ventricle has severely decreased function. There is mildly increased left ventricular hypertrophy. 2. Left ventricular diastolic parameters are indeterminate. 3. The  left ventricle demonstrates global hypokinesis. 4. Global right ventricle has mildly reduced systolic function.The right ventricular size is normal. No increase in right ventricular wall thickness. 5. Left atrial size was mildly dilated. 6. Right atrial size was normal. 7. The mitral valve is grossly normal. Trivial mitral valve regurgitation. 8. The tricuspid valve is normal in structure. 9. Aortic valve area, by VTI measures 0.65 cm. 10. Aortic valve mean gradient measures 22.0 mmHg. 11. Aortic valve peak gradient measures 41.5 mmHg. 12. The aortic valve is abnormal. Aortic valve regurgitation is not visualized. Severe aortic valve stenosis. 13. The pulmonic valve was not well visualized. Pulmonic valve regurgitation is not visualized. 14. Moderately elevated pulmonary artery systolic pressure. 15. The inferior vena cava is dilated in size with <50% respiratory variability, suggesting right atrial pressure of 15 mmHg. __________  24-hour Holter 07/2018: Normal sinus rhythm,  Avg rate 64 bpm  Rare PAC (<1%)  Frequent Ventricular beats (35%), total of 31, 721  23K isolated beats 1,134 couplets 13,950 bigeminal beats NSVT, longest run 26 beats, 1,037 runs total (5,740 beats)  Patient Profile     86 y.o. male with history of mild nonobstructive CAD by LHC in 2016, HFrEF secondary to NICM, moderate to severe aortic stenosis, pulmonary hypertension, possible TIA/CVA with left visual changes, acoustic neuroma s/p gamma knife procedure at [facility] in 1990s, syncope, CKD stage III, frequent PVCs, HTN, and HLD who is being seen today for the evaluation of new onset Afib with RVR.  Assessment & Plan    1. New onset Afib with RVR:  in the setting of acute enteritis  -With cardiomyopathy and underlying severe aortic stenosis Underwent successful TEE-guided DCCV yesterday Maintaining normal sinus rhythm Continue metoprolol, eliquis.  Heart rate in the low 60s On metoprolol succinate  100 twice daily, blood pressure stable -CHADS2VASc at least 7 (CHF, HTN, age x 2, TIA/CVA x 2, vascular disease) Low-dose Eliquis given age and renal dysfunction  2. Elevated troponin/nonobstructive CAD: -Prior cath with nonobstructive disease Demand ischemia this admission in the setting of atrial fibrillation, respiratory distress  3. HFrEF secondary to NICM/pulmonary hypertension: Reduced ejection fraction in the setting of atrial fibrillation, possibly tachycardia mediated -Not currently on ACE-I/ARB/spironolactone/Entresto secondary to acute on CKD  --Repeat echocardiogram and follow-up, now that he has normal sinus rhythm --Continue Lasix IV twice daily, stable renal function Although he is eager to go home, would likely do better with additional day diuresis  4. Aortic stenosis: -Severe by echo , both TTE and TEE Will need expedited TAVR  work up Will place consult Discussed in detail with patient and patient's daughter  5. Possible TIA/CVA: -Transitioned from Plavix to Eliquis and ASA this admission,  -Intolerant to low dose Crestor and has declined rechallenge of statins  6. Acute on CKD stage III: -Renal function stable -Lisinopril held Continue Lasix today for acute systolic CHF symptoms  7. Abdominal aortic aneurysm: -Outpatient follow up   9. Enteritis/hematochezia: -He will need close monitoring  Now on NOAC, post cardioversion Hematocrit stable  Long discussion with daughter and patient concerning aortic valve work-up, TAVR Need for appointments in [facility] for further evaluation  Total encounter time more than 35 minutes  Greater than 50% was spent in counseling and coordination of care with the patient   For questions or updates, please contact [place] Please consult [email or web address] for contact info under Cardiology/STEMI.    Signed, Esmond Plants, MD, Ph.D [facility]

## 2019-08-20 NOTE — Progress Notes (Addendum)
PROGRESS NOTE    DARONTE THUNSTROM  [id number] DOB: 1928/02/13 DOA: 08/14/2019 PCP: Philmore Pali, NP    Brief Narrative:  86 year old man PMH CKD stage III, CAD presenting with acute abdominal pain left lower quadrant, vomiting without diarrhea.  CT suggestive of enteritis.  Admitted for acute abdominal pain with acute enteritis, atrial fibrillation with rapid ventricular response.   Consultants:   Cardiology  Procedures: TEE cardioversion 08-18-2018 CT of abdomen and pelvis Echo  Antimicrobials:   None   Subjective: Patient ambulated with PT states he does not have shortness of breath.  No chest pain, dizziness, or any other symptoms.  Objective: Vitals:   08/19/19 2319 08/20/19 0500 08/20/19 0718 08/20/19 0951  BP: 114/80  120/76 121/74  Pulse: 61  61 (!) 59  Resp: 20  18   Temp: 98.8 F (37.1 C)  98.7 F (37.1 C)   TempSrc:   Oral   SpO2: 98%  98% 95%  Weight:  110.9 kg    Height:        Intake/Output Summary (Last 24 hours) at 08/20/2019 1230 Last data filed at 08/20/2019 1040 Gross per 24 hour  Intake 240 ml  Output 1375 ml  Net -1135 ml   Filed Weights   08/13/19 1800 08/18/19 0314 08/20/19 0500  Weight: 109.3 kg 131.1 kg 110.9 kg    Examination:  General exam: Appears calm and comfortable, sitting in chair, nontachypneic Respiratory system: some scattered bilateral rales  cardiovascular system: S1 & S2 heard, RRR. No  murmurs, rubs, gallops or clicks.  Gastrointestinal system: Abdomen is nondistended, soft and nontender. Normal bowel sounds heard. Central nervous system: Alert and oriented x3 no focal neurological deficits. Extremities:mild b/l edema. Psychiatry: Judgement and insight appear normal. Mood & affect appropriate.     Data Reviewed: I have personally reviewed following labs and imaging studies  CBC: Recent Labs  Lab 08/13/19 1809 08/15/19 0520 08/16/19 2008 08/17/19 0753 08/20/19 0304  WBC 8.8 7.6 11.5* 10.8* 9.2  NEUTROABS  --    --   --  8.9*  --   HGB 13.9 12.6* 13.6 13.5 12.8*  HCT 44.6 41.3 40.9 43.5 41.2  MCV 90.1 92.2 87.8 90.8 92.6  PLT 177 157 181 171 [id number]   Basic Metabolic Panel: Recent Labs  Lab 08/16/19 2008 08/17/19 0753 08/18/19 0339 08/19/19 0755 08/20/19 0304  NA 139 138 138 139 141  K 4.8 4.8 4.4 4.1 4.1  CL 107 104 104 103 101  CO2 19* 21* 27 27 31   GLUCOSE 188* 119* 105* 94 99  BUN 34* 38* 39* 39* 37*  CREATININE 2.09* 1.98* 2.00* 1.93* 1.87*  CALCIUM 9.4 9.7 9.6 9.4 9.6  MG 2.4  --   --   --   --    GFR: Estimated Creatinine Clearance: 34.4 mL/min (A) (by C-G formula based on SCr of 1.87 mg/dL (H)). Liver Function Tests: Recent Labs  Lab 08/13/19 1809  AST 30  ALT 20  ALKPHOS 61  BILITOT 1.2  PROT 7.4  ALBUMIN 4.7   Recent Labs  Lab 08/13/19 1809  LIPASE 28   No results for input(s): AMMONIA in the last 168 hours. Coagulation Profile: Recent Labs  Lab 08/14/19 0624 08/18/19 1721 08/19/19 0545  INR 1.2 1.6* 1.6*   Cardiac Enzymes: No results for input(s): CKTOTAL, CKMB, CKMBINDEX, TROPONINI in the last 168 hours. BNP (last 3 results) No results for input(s): PROBNP in the last 8760 hours. HbA1C: No results for input(s): HGBA1C in  the last 72 hours. CBG: Recent Labs  Lab 08/16/19 2004  GLUCAP 161*   Lipid Profile: No results for input(s): CHOL, HDL, LDLCALC, TRIG, CHOLHDL, LDLDIRECT in the last 72 hours. Thyroid Function Tests: No results for input(s): TSH, T4TOTAL, FREET4, T3FREE, THYROIDAB in the last 72 hours. Anemia Panel: No results for input(s): VITAMINB12, FOLATE, FERRITIN, TIBC, IRON, RETICCTPCT in the last 72 hours. Sepsis Labs: No results for input(s): PROCALCITON, LATICACIDVEN in the last 168 hours.  Recent Results (from the past 240 hour(s))  C difficile quick scan w PCR reflex     Status: None   Collection Time: 08/14/19  2:07 PM   Specimen: STOOL  Result Value Ref Range Status   C Diff antigen NEGATIVE NEGATIVE Final   C Diff toxin  NEGATIVE NEGATIVE Final   C Diff interpretation No C. difficile detected.  Final    Comment: Performed at [facility], [address] [place], [place] [address]  GI pathogen panel by PCR, stool     Status: None   Collection Time: 08/14/19  2:07 PM   Specimen: Stool  Result Value Ref Range Status   Plesiomonas shigelloides NOT DETECTED NOT DETECTED Final   Yersinia enterocolitica NOT DETECTED NOT DETECTED Final   Vibrio NOT DETECTED NOT DETECTED Final   Enteropathogenic E coli NOT DETECTED NOT DETECTED Final   E coli (ETEC) LT/ST NOT DETECTED NOT DETECTED Final   E coli [id number] by PCR Not applicable NOT DETECTED Final   Cryptosporidium by PCR NOT DETECTED NOT DETECTED Final   Entamoeba histolytica NOT DETECTED NOT DETECTED Final   Adenovirus F 40/41 NOT DETECTED NOT DETECTED Final   Norovirus GI/GII NOT DETECTED NOT DETECTED Final   Sapovirus NOT DETECTED NOT DETECTED Final    Comment: (NOTE) Performed At: [facility] [place], [place] [id number] Rush Farmer MD [id number]    Vibrio cholerae NOT DETECTED NOT DETECTED Final   Campylobacter by PCR NOT DETECTED NOT DETECTED Final   Salmonella by PCR NOT DETECTED NOT DETECTED Final   E coli (STEC) NOT DETECTED NOT DETECTED Final   Enteroaggregative E coli NOT DETECTED NOT DETECTED Final   Shigella by PCR NOT DETECTED NOT DETECTED Final   Cyclospora cayetanensis NOT DETECTED NOT DETECTED Final   Astrovirus NOT DETECTED NOT DETECTED Final   G lamblia by PCR NOT DETECTED NOT DETECTED Final   Rotavirus A by PCR NOT DETECTED NOT DETECTED Final  SARS CORONAVIRUS 2 (TAT 6-24 HRS) Nasopharyngeal Nasopharyngeal Swab     Status: None   Collection Time: 08/16/19 11:27 PM   Specimen: Nasopharyngeal Swab  Result Value Ref Range Status   SARS Coronavirus 2 NEGATIVE NEGATIVE Final    Comment: (NOTE) SARS-CoV-2 target nucleic acids are NOT DETECTED. The SARS-CoV-2 RNA is generally detectable in upper and lower  respiratory specimens during the acute phase of infection. Negative results do not preclude SARS-CoV-2 infection, do not rule out co-infections with other pathogens, and should not be used as the sole basis for treatment or other patient management decisions. Negative results must be combined with clinical observations, patient history, and epidemiological information. The expected result is Negative. Fact Sheet for Patients: SugarRoll.be Fact Sheet for Healthcare Providers: https://www.woods-mathews.com/ This test is not yet approved or cleared by the [place] FDA and  has been authorized for detection and/or diagnosis of SARS-CoV-2 by FDA under an Emergency Use Authorization (EUA). This EUA will remain  in effect (meaning this test can be used) for the duration  of the COVID-19 declaration under Section 56 4(b)(1) of the Act, 21 U.S.C. section 360bbb-3(b)(1), unless the authorization is terminated or revoked sooner. Performed at [facility], [place] [address] [place], [place] [address]          Radiology Studies: ECHO TEE  Result Date: 08/19/2019   TRANSESOPHOGEAL ECHO REPORT   Patient Name:   JUMAR MILFORD Date of Exam: 08/19/2019 Medical Rec #:  [id number]       Height:       72.0 in Accession #:    [id number]      Weight:       289.0 lb Date of Birth:  04/29/1928        BSA:          2.49 m Patient Age:    81 years        BP:           132/97 mmHg Patient Gender: M               HR:           114 bpm. Exam Location:  [facility]  Procedure: Color Doppler, Cardiac Doppler, Saline Contrast Bubble Study and            Transesophageal Echo Indications:     I48.91 Atrial fibrillation  History:         Patient has prior history of Echocardiogram examinations, most                  recent 08/14/2019.  Sonographer:     Charmayne Sheer RDCS (AE) Referring Phys:  [id number] Rise Mu Diagnosing Phys: Ida Rogue MD  PROCEDURE: Consent was requested  emergently by emergency room physicain. Local oropharyngeal anesthetic was provided with Benzocaine spray and viscous lidocaine. The transesophogeal probe was passed through the esophogus of the patient. Image quality was  excellent. The patient's vital signs; including heart rate, blood pressure, and oxygen saturation; remained stable throughout the procedure. The patient developed no complications during the procedure. IMPRESSIONS  1. Left ventricular ejection fraction, by visual estimation, is 25 to 30%. The left ventricle has severely decreased function. There is no left ventricular hypertrophy.  2. The left ventricle demonstrates global hypokinesis.  3. Global right ventricle has moderately reduced systolic function.The right ventricular size is mildly enlarged. No increase in right ventricular wall thickness.  4. Left atrial size was moderately dilated.  5. Right atrial size was moderately dilated.  6. Severe aortic valve stenosis.Successful cardioversion after TEE for persistent atrial fibrillation. NSR was restored. FINDINGS  Left Ventricle: Left ventricular ejection fraction, by visual estimation, is 25 to 30%. The left ventricle has severely decreased function. The left ventricle demonstrates global hypokinesis. There is no left ventricular hypertrophy. Normal left atrial pressure. Right Ventricle: The right ventricular size is mildly enlarged. No increase in right ventricular wall thickness. Global RV systolic function is has moderately reduced systolic function. Left Atrium: Left atrial size was moderately dilated. Right Atrium: Right atrial size was moderately dilated Pericardium: There is no evidence of pericardial effusion. Mitral Valve: The mitral valve is normal in structure. No evidence of mitral valve stenosis by observation. Mild to moderate mitral valve regurgitation. Tricuspid Valve: The tricuspid valve is normal in structure. Tricuspid valve regurgitation is mild. Aortic Valve: The aortic  valve is normal in structure. Aortic valve regurgitation is not visualized. Severe aortic stenosis is present. Pulmonic Valve: The pulmonic valve was normal in structure. Pulmonic valve regurgitation is not  visualized. Aorta: The aortic root, ascending aorta and aortic arch are all structurally normal, with no evidence of dilitation or obstruction. Venous: The inferior vena cava is normal in size with greater than 50% respiratory variability, suggesting right atrial pressure of 3 mmHg. Shunts: Agitated saline contrast was given intravenously to evaluate for intracardiac shunting. Saline contrast bubble study was negative, with no evidence of any interatrial shunt. There is no evidence of a patent foramen ovale. No ventricular septal defect is seen or detected. There is no evidence of an atrial septal defect. No atrial level shunt detected by color flow Doppler.  Ida Rogue MD Electronically signed by Ida Rogue MD Signature Date/Time: 08/19/2019/6:36:03 PM    Final         Scheduled Meds: . apixaban  2.5 mg Oral BID  . aspirin EC  81 mg Oral Daily  . furosemide  40 mg Intravenous Q12H  . metoprolol succinate  100 mg Oral BID  . multivitamin with minerals   Oral Daily   Continuous Infusions:  Assessment & Plan:   Active Problems:   CKD (chronic kidney disease), stage III   Aortic stenosis   CHF (congestive heart failure) (HCC)   Acute kidney injury (AKI) with acute tubular necrosis (ATN) (HCC)   Enteritis   Gait instability   Acute kidney injury (HCC)   Atrial fibrillation with rapid ventricular response (HCC)   Abdominal pain   Acute respiratory distress   Acute on chronic systolic heart failure (HCC)  Acute enteritis with associated abdominal pain and vomiting, differential infectious, inflammatory, less likely ischemic based on patency of mesenteric vessels. Afebrile Stool studies negative Resolved  Atrial fibrillation with rapid ventricular response:  S/p TEE CV On  Eliquis with CHA2DS2-VASc 7. Still in normal sinus Continue Toprol-XL 100 mg twice daily Cards following     Reported blood with bowel movement.  No new episode so far here  - patient reports for some time now he has had a little bit of blood with hard bowel movements.  He is not aware of hemorrhoids in the past.  He has not seen a GI doctor. Nothing here.  Stool guiac pending    Gait instability at home, uses a walker, no falls but reports unsteadiness. --PT evaluated recommended home health with PT and aide  -Case manager arranging.  Chronic combined systolic, diastolic CHF secondary to NICM followed by cardiology [facility], sec to HTN heart disease, aortic stenosis and PVCs.  _still volume overloaded -  echocardiogram this admission LVEF 25-30%, global hypokinesis.  Ventricular diastolic parameters left ventricle indeterminate.  Mildly reduced RV function systolic.  Spoke to cards, Dr. Rockey Situ, will continue with  lasix iv Strict I/o - Continue furosemide,metoprolol succinate.  acei on hold   Severe aortic stenosis seen on echocardiogram 08/14/2019 and 07/25/2018. Discussed with cards about TAVR. Once more euvolemic need to address this on this admission I.e. transfer to [facility] vs as outpt.  CAD/Elevated troponin-, last cath 2016 widely patent coronary arteries, mild global left ventricular hypokinesis Likely 2/2 demand ischemia in the setting of acute enteritis and acute on chronic kidney disease and A. fib RVR cardiology following.   This is not consistent with ACS.  AKI superimposed on CKD stage IIIb --likely some from cardiorenal, improving, and close to baseline Continue to monitor  Abdominal aortic aneurysm 3.7 cm --Outpatient ultrasound in 2 years  PMH stroke w/ left eye impact --Continue statin.  Plavix stopped as the patient is now on anticoagulation.  Aortic atherosclerosis, asymptomatic --  Continue statin  6 mm right lower lobe nodular opacity seen on chest  CT 10/2018 during chart review.  Recommend outpatient follow-up with consideration given to CAT scan in the next 3 months, by March.   DVT prophylaxis: Eliquis Code Status: Full Family Communication: Daughter at bedside updated all questions and concerns addressed Disposition Plan: Pt volume overloaded still, needs diuresis, as he is with critical AS. Likely will be here 1-2 more days until more euvolemic and stable.      LOS: 6 days   Time spent: 45 minutes with more than 50% COC    Nolberto Hanlon, MD [facility] Pager [phone]  If 7PM-7AM, please contact night-coverage [email or web address] Password [facility] 08/20/2019, 12:30 PM   Patient ID: KHIEM HUNN, male   DOB: 09/15/1927, 86 y.o.   MRN: [id number]

## 2019-08-21 DIAGNOSIS — R0902 Hypoxemia: Secondary | ICD-10-CM

## 2019-08-21 DIAGNOSIS — N170 Acute kidney failure with tubular necrosis: Secondary | ICD-10-CM

## 2019-08-21 LAB — BASIC METABOLIC PANEL
Anion gap: 11 (ref 5–15)
BUN: 36 mg/dL — ABNORMAL HIGH (ref 8–23)
CO2: 32 mmol/L (ref 22–32)
Calcium: 9.1 mg/dL (ref 8.9–10.3)
Chloride: 100 mmol/L (ref 98–111)
Creatinine, Ser: 1.61 mg/dL — ABNORMAL HIGH (ref 0.61–1.24)
GFR calc Af Amer: 43 mL/min — ABNORMAL LOW (ref >60)
GFR calc non Af Amer: 37 mL/min — ABNORMAL LOW (ref >60)
Glucose, Bld: 100 mg/dL — ABNORMAL HIGH (ref 70–99)
Potassium: 3.4 mmol/L — ABNORMAL LOW (ref 3.5–5.1)
Sodium: 143 mmol/L (ref 135–145)

## 2019-08-21 MED ORDER — POTASSIUM CHLORIDE CRYS ER 20 MEQ PO TBCR
40.0000 meq | EXTENDED_RELEASE_TABLET | Freq: Once | ORAL | Status: AC
  Administered 2019-08-21: 40 meq via ORAL
  Filled 2019-08-21: qty 2

## 2019-08-21 MED ORDER — POTASSIUM CHLORIDE CRYS ER 20 MEQ PO TBCR: 30.0000 meq | EXTENDED_RELEASE_TABLET | Freq: Two times a day (BID) | ORAL | Status: DC

## 2019-08-21 NOTE — Progress Notes (Addendum)
Arrived to room, pt's family member stated MD just left and said he may go home today. R forearm site patent. Dressing loose/ changed. RN made aware.

## 2019-08-21 NOTE — Progress Notes (Signed)
SATURATION QUALIFICATIONS: (This note is used to comply with regulatory documentation for home oxygen)  Patient Saturations on Room Air at Rest = 95%  Patient Saturations on Room Air while Ambulating = 74%  Patient Saturations on 2 Liters of oxygen while Ambulating = 94%  Please briefly explain why patient needs home oxygen:o2 sats drop while amb at room air. Hr maintained at 66 through out amb and rest. At rest sats  Eventually came back up to 98-99% 2l [place].

## 2019-08-21 NOTE — Plan of Care (Signed)
  Problem: Health Behavior/Discharge Planning: Goal: Ability to manage health-related needs will improve Outcome: Progressing   Problem: Clinical Measurements: Goal: Ability to maintain clinical measurements within normal limits will improve Outcome: Progressing Goal: Will remain free from infection Outcome: Progressing Goal: Diagnostic test results will improve Outcome: Progressing Goal: Respiratory complications will improve Outcome: Progressing Goal: Cardiovascular complication will be avoided Outcome: Progressing   Problem: Nutrition: Goal: Adequate nutrition will be maintained Outcome: Progressing   Problem: Coping: Goal: Level of anxiety will decrease Outcome: Progressing   Problem: Elimination: Goal: Will not experience complications related to bowel motility Outcome: Progressing Goal: Will not experience complications related to urinary retention Outcome: Progressing   Problem: Pain Managment: Goal: General experience of comfort will improve Outcome: Progressing   Problem: Safety: Goal: Ability to remain free from injury will improve Outcome: Progressing   Problem: Skin Integrity: Goal: Risk for impaired skin integrity will decrease Outcome: Progressing   Problem: Education: Goal: Knowledge of General Education information will improve Description: Including pain rating scale, medication(s)/side effects and non-pharmacologic comfort measures Outcome: Progressing   Problem: Health Behavior/Discharge Planning: Goal: Ability to manage health-related needs will improve Outcome: Progressing   Problem: Clinical Measurements: Goal: Ability to maintain clinical measurements within normal limits will improve Outcome: Progressing Goal: Will remain free from infection Outcome: Progressing Goal: Diagnostic test results will improve Outcome: Progressing Goal: Respiratory complications will improve Outcome: Progressing Goal: Cardiovascular complication will be  avoided Outcome: Progressing   Problem: Activity: Goal: Risk for activity intolerance will decrease Outcome: Progressing   Problem: Nutrition: Goal: Adequate nutrition will be maintained Outcome: Progressing   Problem: Coping: Goal: Level of anxiety will decrease Outcome: Progressing   Problem: Elimination: Goal: Will not experience complications related to bowel motility Outcome: Progressing Goal: Will not experience complications related to urinary retention Outcome: Progressing   Problem: Safety: Goal: Ability to remain free from injury will improve Outcome: Progressing   Problem: Pain Managment: Goal: General experience of comfort will improve Outcome: Progressing   Problem: Skin Integrity: Goal: Risk for impaired skin integrity will decrease Outcome: Progressing

## 2019-08-21 NOTE — Care Management Important Message (Signed)
Important Message  Patient Details  Name: Clinton Holmes MRN: [id number] Date of Birth: 1927/08/19   Medicare Important Message Given:  Yes     [name] 08/21/2019, 11:02 AM

## 2019-08-21 NOTE — Progress Notes (Signed)
PROGRESS NOTE    Clinton Holmes  [id number] DOB: July 24, 1928 DOA: 08/14/2019 PCP: Philmore Pali, NP    Brief Narrative:  86 year old man PMH CKD stage III, CAD presenting with acute abdominal pain left lower quadrant, vomiting without diarrhea. CT suggestive of enteritis. Admitted for acute abdominal pain with acute enteritis, atrial fibrillation with rapid ventricular response.   Consultants:   Cardiology  Procedures: TEE cardioversion 08-18-2018 CT of abdomen and pelvis Echo  Antimicrobials:   None    Subjective: Patient seen and examined.  He is less tachypneic.  He denies shortness of breath although he still on oxygen.  Denies chest pain.  No new complaints  Objective: Vitals:   08/21/19 0500 08/21/19 0740 08/21/19 0953 08/21/19 1142  BP:  138/81 113/80 (!) 127/94  Pulse:  (!) 113 (!) 56 60  Resp:    20  Temp:    98.3 F (36.8 C)  TempSrc:      SpO2:  94% 100% 100%  Weight: 108.3 kg     Height:        Intake/Output Summary (Last 24 hours) at 08/21/2019 1242 Last data filed at 08/21/2019 0958 Gross per 24 hour  Intake --  Output 1775 ml  Net -1775 ml   Filed Weights   08/18/19 0314 08/20/19 0500 08/21/19 0500  Weight: 131.1 kg 110.9 kg 108.3 kg    Examination:  General exam: Appears calm and comfortable  Respiratory system: Bilateral rales at bases, no wheezing or rhonchi Cardiovascular system: S1 & S2 heard, RRR. No JVD, murmurs, rubs, gallops or clicks. Gastrointestinal system: Abdomen is nondistended, soft and nontender.  Normal bowel sounds heard. Central nervous system: Alert and oriented. No focal neurological deficits. Extremities: No edema Skin: Warm dry Psychiatry: Judgement and insight appear normal. Mood & affect appropriate.     Data Reviewed: I have personally reviewed following labs and imaging studies  CBC: Recent Labs  Lab 08/15/19 0520 08/16/19 2008 08/17/19 0753 08/20/19 0304  WBC 7.6 11.5* 10.8* 9.2  NEUTROABS  --    --  8.9*  --   HGB 12.6* 13.6 13.5 12.8*  HCT 41.3 40.9 43.5 41.2  MCV 92.2 87.8 90.8 92.6  PLT 157 181 171 [id number]   Basic Metabolic Panel: Recent Labs  Lab 08/16/19 2008 08/17/19 0753 08/18/19 0339 08/19/19 0755 08/20/19 0304 08/21/19 0212  NA 139 138 138 139 141 143  K 4.8 4.8 4.4 4.1 4.1 3.4*  CL 107 104 104 103 101 100  CO2 19* 21* 27 27 31  32  GLUCOSE 188* 119* 105* 94 99 100*  BUN 34* 38* 39* 39* 37* 36*  CREATININE 2.09* 1.98* 2.00* 1.93* 1.87* 1.61*  CALCIUM 9.4 9.7 9.6 9.4 9.6 9.1  MG 2.4  --   --   --   --   --    GFR: Estimated Creatinine Clearance: 39.6 mL/min (A) (by C-G formula based on SCr of 1.61 mg/dL (H)). Liver Function Tests: No results for input(s): AST, ALT, ALKPHOS, BILITOT, PROT, ALBUMIN in the last 168 hours. No results for input(s): LIPASE, AMYLASE in the last 168 hours. No results for input(s): AMMONIA in the last 168 hours. Coagulation Profile: Recent Labs  Lab 08/18/19 1721 08/19/19 0545  INR 1.6* 1.6*   Cardiac Enzymes: No results for input(s): CKTOTAL, CKMB, CKMBINDEX, TROPONINI in the last 168 hours. BNP (last 3 results) No results for input(s): PROBNP in the last 8760 hours. HbA1C: No results for input(s): HGBA1C in the last 72 hours. CBG:  Recent Labs  Lab 08/16/19 2004  GLUCAP 161*   Lipid Profile: No results for input(s): CHOL, HDL, LDLCALC, TRIG, CHOLHDL, LDLDIRECT in the last 72 hours. Thyroid Function Tests: No results for input(s): TSH, T4TOTAL, FREET4, T3FREE, THYROIDAB in the last 72 hours. Anemia Panel: No results for input(s): VITAMINB12, FOLATE, FERRITIN, TIBC, IRON, RETICCTPCT in the last 72 hours. Sepsis Labs: No results for input(s): PROCALCITON, LATICACIDVEN in the last 168 hours.  Recent Results (from the past 240 hour(s))  C difficile quick scan w PCR reflex     Status: None   Collection Time: 08/14/19  2:07 PM   Specimen: STOOL  Result Value Ref Range Status   C Diff antigen NEGATIVE NEGATIVE Final   C  Diff toxin NEGATIVE NEGATIVE Final   C Diff interpretation No C. difficile detected.  Final    Comment: Performed at [facility], [address] [place], [place] [address]  GI pathogen panel by PCR, stool     Status: None   Collection Time: 08/14/19  2:07 PM   Specimen: Stool  Result Value Ref Range Status   Plesiomonas shigelloides NOT DETECTED NOT DETECTED Final   Yersinia enterocolitica NOT DETECTED NOT DETECTED Final   Vibrio NOT DETECTED NOT DETECTED Final   Enteropathogenic E coli NOT DETECTED NOT DETECTED Final   E coli (ETEC) LT/ST NOT DETECTED NOT DETECTED Final   E coli [id number] by PCR Not applicable NOT DETECTED Final   Cryptosporidium by PCR NOT DETECTED NOT DETECTED Final   Entamoeba histolytica NOT DETECTED NOT DETECTED Final   Adenovirus F 40/41 NOT DETECTED NOT DETECTED Final   Norovirus GI/GII NOT DETECTED NOT DETECTED Final   Sapovirus NOT DETECTED NOT DETECTED Final    Comment: (NOTE) Performed At: [facility] [place], [place] [id number] Rush Farmer MD [id number]    Vibrio cholerae NOT DETECTED NOT DETECTED Final   Campylobacter by PCR NOT DETECTED NOT DETECTED Final   Salmonella by PCR NOT DETECTED NOT DETECTED Final   E coli (STEC) NOT DETECTED NOT DETECTED Final   Enteroaggregative E coli NOT DETECTED NOT DETECTED Final   Shigella by PCR NOT DETECTED NOT DETECTED Final   Cyclospora cayetanensis NOT DETECTED NOT DETECTED Final   Astrovirus NOT DETECTED NOT DETECTED Final   G lamblia by PCR NOT DETECTED NOT DETECTED Final   Rotavirus A by PCR NOT DETECTED NOT DETECTED Final  SARS CORONAVIRUS 2 (TAT 6-24 HRS) Nasopharyngeal Nasopharyngeal Swab     Status: None   Collection Time: 08/16/19 11:27 PM   Specimen: Nasopharyngeal Swab  Result Value Ref Range Status   SARS Coronavirus 2 NEGATIVE NEGATIVE Final    Comment: (NOTE) SARS-CoV-2 target nucleic acids are NOT DETECTED. The SARS-CoV-2 RNA is generally detectable in upper  and lower respiratory specimens during the acute phase of infection. Negative results do not preclude SARS-CoV-2 infection, do not rule out co-infections with other pathogens, and should not be used as the sole basis for treatment or other patient management decisions. Negative results must be combined with clinical observations, patient history, and epidemiological information. The expected result is Negative. Fact Sheet for Patients: SugarRoll.be Fact Sheet for Healthcare Providers: https://www.woods-mathews.com/ This test is not yet approved or cleared by the [place] FDA and  has been authorized for detection and/or diagnosis of SARS-CoV-2 by FDA under an Emergency Use Authorization (EUA). This EUA will remain  in effect (meaning this test can be used) for the duration of the COVID-19 declaration under  Section 56 4(b)(1) of the Act, 21 U.S.C. section 360bbb-3(b)(1), unless the authorization is terminated or revoked sooner. Performed at [facility], [place] [address], [place], [place] [address]          Radiology Studies: No results found.      Scheduled Meds: . apixaban  2.5 mg Oral BID  . aspirin EC  81 mg Oral Daily  . furosemide  40 mg Intravenous Q12H  . metoprolol succinate  100 mg Oral BID  . multivitamin with minerals   Oral Daily  . potassium chloride  40 mEq Oral Once   Continuous Infusions:  Assessment & Plan:   Active Problems:   CKD (chronic kidney disease), stage III   Aortic stenosis   CHF (congestive heart failure) (HCC)   Acute kidney injury (AKI) with acute tubular necrosis (ATN) (HCC)   Enteritis   Gait instability   Acute kidney injury (HCC)   Atrial fibrillation with rapid ventricular response (HCC)   Abdominal pain   Acute respiratory distress   Acute on chronic systolic heart failure (HCC)   Acute enteritis with associated abdominal pain and vomiting, differential infectious,  inflammatory, less likely ischemic based on patency of mesenteric vessels. Afebrile Stool studies negative Resolved  Atrial fibrillation with rapid ventricular response:  S/p TEE CV On Eliquis with CHA2DS2-VASc7. Still in normal sinus Continue Toprol-XL 100 mg twice daily Cards following     Reported blood with bowel movement.No new episode so far here  - patient reports for some time now he has had a little bit of blood with hard bowel movements. He is not aware of hemorrhoids in the past. He has not seen a GI doctor. Nothing here.  Stool guiac pending    Gait instability at home, uses a walker, no falls but reports unsteadiness. --PT evaluated recommended home health with PT and aide  -Case manager arranging.  Chronic combined systolic, diastolic CHF secondary to NICM followed by cardiology [facility], sec to HTN heart disease, aortic stenosis and PVCs. _still volume overloaded, will continue IV Lasix - echocardiogram this admission LVEF 25-30%, global hypokinesis. Ventricular diastolic parameters left ventricle indeterminate. Mildly reduced RV function systolic.  Cardiology following Strict I/o - Continue metoprolol succinate.  acei on hold   Severe aortic stenosis seen on echocardiogram 08/14/2019 and 07/25/2018. Discussed with cards about TAVR. Once more euvolemic need to address this on this admission I.e. transfer to [facility] vs as outpt.  CAD/Elevated troponin-, last cath 2016 widely patent coronary arteries, mild global left ventricular hypokinesis Likely 2/2 demand ischemia in the setting of acute enteritis and acute on chronic kidney disease and A. fib RVR cardiology following.   This is not consistent with ACS.  AKI superimposed on CKD stage IIIb --likely some from cardiorenal, continues to improve Continue to monitor  Abdominal aortic aneurysm 3.7 cm --Outpatient ultrasound in 2 years  PMH stroke w/ left eye impact --Continue statin. Plavix  stopped as the patient is now on anticoagulation.  Aortic atherosclerosis, asymptomatic --Continue statin  6 mm right lower lobe nodular opacity seen on chest CT 10/2018 during chart review. Recommend outpatient follow-up with consideration given to CAT scan in the next 3 months, by March.   DVT prophylaxis: Eliquis Code Status: Full Family Communication:  None at bedside Disposition Plan: Pt volume overloaded still, needs iv diuresis, as he has  critical AS. Likely will be here 1-2 more days until more euvolemic and stable.     LOS: 7 days   Time  spent: 45 minutes with more than 50% COC    Nolberto Hanlon, MD [facility] Pager [phone]  If 7PM-7AM, please contact night-coverage [email or web address] Password [facility] 08/21/2019, 12:42 PM

## 2019-08-21 NOTE — Progress Notes (Signed)
Physical Therapy Treatment Patient Details Name: Clinton Holmes MRN: [id number] DOB: 1928-02-15 Today's Date: 08/21/2019    History of Present Illness 86 year old male with PMH of CKD, CAD, HTN, murmur, OA and ventricular bigeminy presented to ED on 08/13/2018 with complaints of abdominal pain, nausea, vomiting and loose stools.  A CT scan of the abdomen was performed which revealed evidence of enteritis.  Patient also noted to be in atrial fibrillation with rapid ventricular response in the emergency department and as such IV metoprolol 5 mg was administered with rate control.    PT Comments    Pt did well with bed mobility, showed great LE strength with exercises and was safe with 200 ft of ambulation (on 2 L, leaning heavily on RW, which is his baseline).  Overall pt did well and is very much feeling ready to discharge home - making a plea for me to try and make that happen.  Explained that from a PT stand-point he is good but that medically is appears that he needs another night.     Follow Up Recommendations  Home health PT;Supervision/Assistance - 24 hour     Equipment Recommendations  Rolling walker with 5" wheels    Recommendations for Other Services       Precautions / Restrictions Precautions Precautions: Fall Restrictions Weight Bearing Restrictions: No    Mobility  Bed Mobility Overal bed mobility: Modified Independent Bed Mobility: Supine to Sit     Supine to sit: Min guard     General bed mobility comments: Pt showed good confidence in getting himself up to EOB and back to supine  Transfers Overall transfer level: Needs assistance Equipment used: Rolling walker (2 wheeled) Transfers: Sit to/from Stand Sit to Stand: Min guard         General transfer comment: Pt struggled to rise from lowest bed setting, but with extra time, heavy forward lean and use of UEs was able to rise w/o PT having to do any phyiscal assist.  Pt with uncontrolled descent back into bed     Ambulation/Gait Ambulation/Gait assistance: Min guard Gait Distance (Feet): 200 Feet Assistive device: Rolling walker (2 wheeled)       General Gait Details: Pt with forward lean and walker reliance but ultimatley showed good stability and confidence with ambulation.  He did some ambulation on room air with nursing earlier so we remained on 2L t/o the session and O2 stayed in the high 90s.   Stairs             Wheelchair Mobility    Modified Rankin (Stroke Patients Only)       Balance Overall balance assessment: Modified Independent Sitting-balance support: No upper extremity supported Sitting balance-Leahy Scale: Good Sitting balance - Comments: able to sit confidently at EOB     Standing balance-Leahy Scale: Fair Standing balance comment: Pt reliant on UEs/AD but able to maintain standing balance w/o issue                            Cognition Arousal/Alertness: Awake/alert Behavior During Therapy: WFL for tasks assessed/performed;Agitated Overall Cognitive Status: Within Functional Limits for tasks assessed                                        Exercises General Exercises - Lower Extremity Ankle Circles/Pumps: AROM;10 reps Heel Slides: Strengthening;15 reps Hip  ABduction/ADduction: Strengthening;10 reps Straight Leg Raises: AROM;10 reps    General Comments        Pertinent Vitals/Pain Pain Assessment: No/denies pain    Home Living                      Prior Function            PT Goals (current goals can now be found in the care plan section) Progress towards PT goals: Progressing toward goals    Frequency    Min 2X/week      PT Plan Current plan remains appropriate    Co-evaluation              AM-PAC PT "6 Clicks" Mobility   Outcome Measure  Help needed turning from your back to your side while in a flat bed without using bedrails?: None Help needed moving from lying on your back to  sitting on the side of a flat bed without using bedrails?: None Help needed moving to and from a bed to a chair (including a wheelchair)?: A Little Help needed standing up from a chair using your arms (e.g., wheelchair or bedside chair)?: A Little Help needed to walk in hospital room?: A Little Help needed climbing 3-5 steps with a railing? : A Little 6 Click Score: 20    End of Session Equipment Utilized During Treatment: Gait belt Activity Tolerance: Patient limited by fatigue;Patient tolerated treatment well Patient left: in chair;with chair alarm set Nurse Communication: Mobility status PT Visit Diagnosis: Unsteadiness on feet (R26.81);History of falling (Z91.81);Muscle weakness (generalized) (M62.81)     Time: [id number] PT Time Calculation (min) (ACUTE ONLY): 28 min  Charges:  $Gait Training: 8-22 mins $Therapeutic Exercise: 8-22 mins                     Kreg Shropshire, DPT 08/21/2019, 5:00 PM

## 2019-08-21 NOTE — Progress Notes (Signed)
[place] for apixaban Indication: atrial fibrillation  No Known Allergies  Patient Measurements: Height: 6' (182.9 cm) Weight: 238 lb 12.1 oz (108.3 kg) IBW/kg (Calculated) : 77.6   Vital Signs: BP: 113/80 (01/08 0953) Pulse Rate: 56 (01/08 0953)  Labs: Recent Labs    08/18/19 1721 08/19/19 0545 08/19/19 0755 08/20/19 0304 08/21/19 0212  HGB  --   --   --  12.8*  --   HCT  --   --   --  41.2  --   PLT  --   --   --  163  --   LABPROT 19.0* 18.6*  --   --   --   INR 1.6* 1.6*  --   --   --   CREATININE  --   --  1.93* 1.87* 1.61*    Estimated Creatinine Clearance: 39.6 mL/min (A) (by C-G formula based on SCr of 1.61 mg/dL (H)).  Assessment: Pharmacy consulted for apixaban dosing for 86 yo male admitted with new onset atrial fibrillation. Patient admitted with [facility] of at least 7 (Age, CVA, HTN, CHF and vascular disease). Patient currently on apixaban 2.5mg  BID secondary to age > 64 and serum creatinine > 1.5.   Goal of Therapy:  Monitor platelets by anticoagulation protocol: Yes   Plan:  Will continue apixaban 2.5mg  BID. Patient with improving serum creatinine. Will need to dose adjust if/when serum creatinine falls below 1.5. Serum creatinine ordered with am labs.   Pharmacy will continue to monitor and adjust per consult.   Anastasia Tompson L, RPh 08/21/2019 11:41 AM

## 2019-08-21 NOTE — Progress Notes (Signed)
Progress Note  Patient Name: Clinton Holmes Date of Encounter: 08/21/2019  Primary Cardiologist: Fletcher Anon  Subjective   Wants to go home. Maintaining sinus rhythm with PACs and PVCs with runs of NSVT s/p TEE-guided DCCV on 08/19/2019. Documented UOP of 1.5 L for the past 24 hours. Weight 110.9-->108.3 kg. Renal function continues to improve. Potassium low at 3.4 this morning. Remains on IV Lasix 40 mg bid. Remains on supplemental oxygen via nasal cannula at 2 L. Not on oxygen at home. Ambulated with PT 1/7 ~ 100 feet with heavy reliance on walker on room air.   Inpatient Medications    Scheduled Meds: . apixaban  2.5 mg Oral BID  . aspirin EC  81 mg Oral Daily  . furosemide  40 mg Intravenous Q12H  . metoprolol succinate  100 mg Oral BID  . multivitamin with minerals   Oral Daily   Continuous Infusions:  PRN Meds: acetaminophen **OR** acetaminophen, magnesium hydroxide, ondansetron **OR** ondansetron (ZOFRAN) IV, traZODone   Vital Signs    Vitals:   08/20/19 2110 08/20/19 2324 08/21/19 0500 08/21/19 0740  BP: 130/80 135/83  138/81  Pulse: (!) 59 62  (!) 113  Resp: 18 18    Temp: (!) 97.5 F (36.4 C) (!) 97.5 F (36.4 C)    TempSrc:  Oral    SpO2: 100% 98%  94%  Weight:   108.3 kg   Height:        Intake/Output Summary (Last 24 hours) at 08/21/2019 0855 Last data filed at 08/21/2019 0813 Gross per 24 hour  Intake --  Output 1625 ml  Net -1625 ml   Filed Weights   08/18/19 0314 08/20/19 0500 08/21/19 0500  Weight: 131.1 kg 110.9 kg 108.3 kg    Telemetry    SR with PACs and PVCs, NSVT with the longest interval lasting 14 beats, heart rate in the upper 50s to low 60s bpm - Personally Reviewed  ECG    No new tracings - Personally Reviewed  Physical Exam   GEN: No acute distress.   Neck: No JVD. Cardiac: Frequent ectopy, no murmurs, rubs, or gallops.  Respiratory: Faint crackles along the right base. Remains on supplemental oxygen via nasal cannula at 2 L. GI:  Soft, nontender, non-distended.   MS: No edema; No deformity. Neuro:  Alert and oriented x 3; Nonfocal.  Psych: Normal affect.  Labs    Chemistry Recent Labs  Lab 08/19/19 0755 08/20/19 0304 08/21/19 0212  NA 139 141 143  K 4.1 4.1 3.4*  CL 103 101 100  CO2 27 31 32  GLUCOSE 94 99 100*  BUN 39* 37* 36*  CREATININE 1.93* 1.87* 1.61*  CALCIUM 9.4 9.6 9.1  GFRNONAA 30* 31* 37*  GFRAA 35* 36* 43*  ANIONGAP 9 9 11      Hematology Recent Labs  Lab 08/16/19 2008 08/17/19 0753 08/20/19 0304  WBC 11.5* 10.8* 9.2  RBC 4.66 4.79 4.45  HGB 13.6 13.5 12.8*  HCT 40.9 43.5 41.2  MCV 87.8 90.8 92.6  MCH 29.2 28.2 28.8  MCHC 33.3 31.0 31.1  RDW 15.3 15.2 15.1  PLT 181 171 163    Cardiac EnzymesNo results for input(s): TROPONINI in the last 168 hours. No results for input(s): TROPIPOC in the last 168 hours.   BNP Recent Labs  Lab 08/16/19 2007  BNP 2,308.0*     DDimer No results for input(s): DDIMER in the last 168 hours.   Radiology    CT abdomen/pelvis 08/14/2019:  IMPRESSION: 1. Mildly prominent fluid-filled small bowel loops in the pelvis and right pelvis with mild adjacent mesenteric edema and free fluid. Findings suspicious for enteritis, likely infectious or inflammatory. Possibility of ischemia is considered, however felt less likely given the length of bowel involvement and patency of mesenteric vessels. 2. Infrarenal aortic aneurysm maximal dimension 3.7 cm. Recommend followup by ultrasound in 2 years. This recommendation follows ACR consensus guidelines: White Paper of the ACR Incidental Findings Committee II on Vascular Findings. J Am Coll Radiol 2013; 10:789-794. Aortic aneurysm NOS (ICD10-I71.9) 3. Small bilateral pleural effusions with adjacent atelectasis. Mild cardiomegaly. 4. Additional chronic findings as described.  Aortic Atherosclerosis (ICD10-I70.0). __________  CXR 08/16/2019: IMPRESSION: Cardiomegaly with mild, diffuse interstitial  pulmonary opacity and small, layering bilateral pleural effusions, likely edema. Atypical/viral infection is a differential consideration.   Cardiac Studies   TEE/DCCV 08/19/2019: 1. Left ventricular ejection fraction, by visual estimation, is 25 to 30%. The left ventricle has severely decreased function. There is no left ventricular hypertrophy.  2. The left ventricle demonstrates global hypokinesis.  3. Global right ventricle has moderately reduced systolic function.The right ventricular size is mildly enlarged. No increase in right ventricular wall thickness.  4. Left atrial size was moderately dilated.  5. Right atrial size was moderately dilated.  6. Severe aortic valve stenosis.Successful cardioversion after TEE for persistent atrial fibrillation. NSR was restored. __________  2D Echo 08/14/2019: 1. Left ventricular ejection fraction, by visual estimation, is 25 to 30%. The left ventricle has severely decreased function. There is mildly increased left ventricular hypertrophy. 2. Left ventricular diastolic parameters are indeterminate. 3. The left ventricle demonstrates global hypokinesis. 4. Global right ventricle has mildly reduced systolic function.The right ventricular size is normal. No increase in right ventricular wall thickness. 5. Left atrial size was mildly dilated. 6. Right atrial size was normal. 7. The mitral valve is grossly normal. Trivial mitral valve regurgitation. 8. The tricuspid valve is normal in structure. 9. Aortic valve area, by VTI measures 0.65 cm. 10. Aortic valve mean gradient measures 22.0 mmHg. 11. Aortic valve peak gradient measures 41.5 mmHg. 12. The aortic valve is abnormal. Aortic valve regurgitation is not visualized. Severe aortic valve stenosis. 13. The pulmonic valve was not well visualized. Pulmonic valve regurgitation is not visualized. 14. Moderately elevated pulmonary artery systolic pressure. 15. The inferior vena cava is dilated in  size with <50% respiratory variability, suggesting right atrial pressure of 15 mmHg. __________  24-hour Holter 07/2018: Normal sinus rhythm,  Avg rate 64 bpm  Rare PAC (<1%)  Frequent Ventricular beats (35%), total of 31, 721  23K isolated beats 1,134 couplets 13,950 bigeminal beats NSVT, longest run 26 beats, 1,037 runs total (5,740 beats)  Patient Profile     86 y.o. male with history of mild nonobstructive CAD by LHC in 2016, HFrEF secondary to NICM, moderate to severe aortic stenosis,pulmonary hypertension, possibleTIA/CVA with left visual changes, acoustic neuroma s/p gamma knife procedure at [facility] in 1990s, syncope, CKD stage III, frequent PVCs, HTN, and HLDwho is being seen today for the evaluation of new onset Afib with RVR.  Assessment & Plan    1. New onset Afib with RVR: -In the setting of acute enteritis  -Status post successful TEE-guided DCCV 08/19/2019 -Maintaining sinus rhythm with PACs and PVCs -Continue Toprol XL 100 mg bid for rate control  -CHADS2VASc at least 7 (CHF, HTN, age x 2, TIA/CVA x 2, vascular disease) -Low-dose Eliquis given age and renal dysfunction, though if his renal function continues to  improve to less than 1.5, he will need full dose Eliquis   2. Elevated troponin/nonobstructive CAD: -Prior cath with nonobstructive disease -Demand ischemia this admission in the setting of atrial fibrillation, respiratory distress -Will need outpatient ischemic work up given demand ischemia and in the setting of severe AS -ASA  3. HFrEF secondary to NICM/pulmonary hypertension: -Reduced ejection fraction in the setting of atrial fibrillation, possibly tachycardia mediated as well as possible severe AS -Not currently on ACE-I/ARB/spironolactone/Entresto secondary to acute on CKD  -Repeat echocardiogram in follow-up, now that he has normal sinus rhythm -Volume status continues to improve -Remains on supplemental oxygen via nasal cannula at 2 L,  attempting wean today -Will need to ambulate on room air to assess O2 saturation, heart rates and symptoms -Wants to go home -Likely needs at least another day of IV Lasix, will discuss with MD  4. Aortic stenosis: -Severe by echo , both TTE and TEE -Will need TAVR work up, including R/LHC as an outpatient  5. Possible TIA/CVA: -Transitioned from Plavix to Eliquis and ASA this admission  -Intolerant to low dose Crestor and has declined rechallenge of statins  6. Acute on CKD stage III: -Renal function stable -Lisinopril held  7. Abdominal aortic aneurysm: -Outpatient follow up   9. Enteritis/hematochezia: -Enteritis improved  -He will need close monitoring  -Now on DOAC and post cardioversion -HGB stable  10. Hypokalemia: -Replete to goal of 4.0, give another 40 mEq today  11. PACs/PVCs/NSVT: -Reports he is asymptomatic -Replete potassium as above -Toprol XL -Ischemic evaluation as above -Magnesium at goal this admission     For questions or updates, please contact [place] Please consult [email or web address] for contact info under Cardiology/STEMI.    Signed, Christell Faith, PA-C [place] Pager: [phone] 08/21/2019, 8:55 AM

## 2019-08-22 LAB — CREATININE, SERUM
Creatinine, Ser: 1.69 mg/dL — ABNORMAL HIGH (ref 0.61–1.24)
GFR calc Af Amer: 41 mL/min — ABNORMAL LOW (ref >60)
GFR calc non Af Amer: 35 mL/min — ABNORMAL LOW (ref >60)

## 2019-08-22 MED ORDER — METOPROLOL SUCCINATE ER 50 MG PO TB24
50.0000 mg | ORAL_TABLET | Freq: Two times a day (BID) | ORAL | Status: DC
  Administered 2019-08-22 – 2019-08-23 (×3): 50 mg via ORAL
  Filled 2019-08-22 (×3): qty 1

## 2019-08-22 MED ORDER — SODIUM CHLORIDE 0.9% FLUSH: 3.0000 mL | INTRAVENOUS | Status: DC | PRN

## 2019-08-22 MED ORDER — SODIUM CHLORIDE 0.9% FLUSH
3.0000 mL | Freq: Two times a day (BID) | INTRAVENOUS | Status: DC
  Administered 2019-08-22 – 2019-08-23 (×3): 3 mL via INTRAVENOUS

## 2019-08-22 NOTE — Progress Notes (Signed)
02 replaced mutiple times with pt educated with pt states he is unaware. Ambulated in hall without 02 with pulse ox reported 94 % RA. Denies co's. DOE on exertion which resolves with rest. Lasix IV therapy continued. Pt had 4 beat run reported by telemetry this a.m. Staffed with Dr. Kurtis Bushman and Dr Conni Slipper reporting 4 beat run of VTachy with pt asymptomatic; discussed metoprolol held last night due to bradycardia with dose adjustment started this morning at 0955. Dgt in to visit.

## 2019-08-22 NOTE — Plan of Care (Signed)

## 2019-08-22 NOTE — Progress Notes (Signed)
[place] for apixaban Indication: atrial fibrillation  No Known Allergies  Patient Measurements: Height: 6' (182.9 cm) Weight: 235 lb 7.2 oz (106.8 kg) IBW/kg (Calculated) : 77.6   Vital Signs: Temp: 97.5 F (36.4 C) (01/09 0738) Temp Source: Oral (01/09 0738) BP: 132/75 (01/09 0738) Pulse Rate: 62 (01/09 0738)  Labs: Recent Labs    08/20/19 0304 08/21/19 0212 08/22/19 0408  HGB 12.8*  --   --   HCT 41.2  --   --   PLT 163  --   --   CREATININE 1.87* 1.61* 1.69*    Estimated Creatinine Clearance: 37.4 mL/min (A) (by C-G formula based on SCr of 1.69 mg/dL (H)).  Assessment: Pharmacy consulted for apixaban dosing for 86 yo male admitted with new onset atrial fibrillation. Patient admitted with [facility] of at least 7 (Age, CVA, HTN, CHF and vascular disease). Patient currently on apixaban 2.5mg  BID secondary to age > 63 and serum creatinine > 1.5.   Goal of Therapy:  Monitor platelets by anticoagulation protocol: Yes   Plan:  Will continue apixaban 2.5mg  BID. Patient with initially improving serum creatinine, now worse than previous day. Appears patient's baseline creatinine may be above 1.5, so likely patient won't qualify for increase in dose. Will continue to follow renal function and adjust if clinically indicated.  Pharmacy will continue to monitor and adjust per consult.   Tawnya Crook, PharmD 08/22/2019 9:18 AM

## 2019-08-22 NOTE — Progress Notes (Signed)
PROGRESS NOTE    Clinton Holmes  [id number] DOB: 1927/12/29 DOA: 08/14/2019 PCP: Philmore Pali, NP    Brief Narrative:  86 year old man PMH CKD stage III, CAD presenting with acute abdominal pain left lower quadrant, vomiting without diarrhea. CT suggestive of enteritis. Admitted for acute abdominal pain with acute enteritis, atrial fibrillation with rapid ventricular response.   Consultants:   Cardiology  Procedures: TEE cardioversion 08-18-2018 CT of abdomen and pelvis Echo  Antimicrobials:   None    Subjective: Laying in bed this AM.  States his shortness of breath is better.  No chest pain. Bradycardic overnight did not receive last dose of beta-blocker last night.  This a.m. also bradycardic in low 60s.  Denies dizziness. Objective: Vitals:   08/22/19 0441 08/22/19 0738 08/22/19 1048 08/22/19 1053  BP:  132/75    Pulse:  62 66   Resp:  18    Temp:  (!) 97.5 F (36.4 C)    TempSrc:  Oral    SpO2:  100% 99% 94%  Weight: 106.8 kg     Height:        Intake/Output Summary (Last 24 hours) at 08/22/2019 1324 Last data filed at 08/22/2019 1039 Gross per 24 hour  Intake --  Output 1425 ml  Net -1425 ml   Filed Weights   08/20/19 0500 08/21/19 0500 08/22/19 0441  Weight: 110.9 kg 108.3 kg 106.8 kg    Examination:  General exam: Appears calm and comfortable  Respiratory system: Scattered bibasilar rales , no wheezing or rhonchi Cardiovascular system: RRR, S1 & S2 heard, RRR. No murmurs, rubs, gallops or clicks. Gastrointestinal system: Abdomen is nondistended, soft and nontender.  Normal bowel sounds heard. Central nervous system: Alert and oriented. No focal neurological deficits. Extremities: No edema, or cynosis Skin: Warm dry Psychiatry:  Mood & affect appropriate in current setting.     Data Reviewed: I have personally reviewed following labs and imaging studies  CBC: Recent Labs  Lab 08/16/19 2008 08/17/19 0753 08/20/19 0304  WBC 11.5*  10.8* 9.2  NEUTROABS  --  8.9*  --   HGB 13.6 13.5 12.8*  HCT 40.9 43.5 41.2  MCV 87.8 90.8 92.6  PLT 181 171 [id number]   Basic Metabolic Panel: Recent Labs  Lab 08/16/19 2008 08/17/19 0753 08/18/19 0339 08/19/19 0755 08/20/19 0304 08/21/19 0212 08/22/19 0408  NA 139 138 138 139 141 143  --   K 4.8 4.8 4.4 4.1 4.1 3.4*  --   CL 107 104 104 103 101 100  --   CO2 19* 21* 27 27 31  32  --   GLUCOSE 188* 119* 105* 94 99 100*  --   BUN 34* 38* 39* 39* 37* 36*  --   CREATININE 2.09* 1.98* 2.00* 1.93* 1.87* 1.61* 1.69*  CALCIUM 9.4 9.7 9.6 9.4 9.6 9.1  --   MG 2.4  --   --   --   --   --   --    GFR: Estimated Creatinine Clearance: 37.4 mL/min (A) (by C-G formula based on SCr of 1.69 mg/dL (H)). Liver Function Tests: No results for input(s): AST, ALT, ALKPHOS, BILITOT, PROT, ALBUMIN in the last 168 hours. No results for input(s): LIPASE, AMYLASE in the last 168 hours. No results for input(s): AMMONIA in the last 168 hours. Coagulation Profile: Recent Labs  Lab 08/18/19 1721 08/19/19 0545  INR 1.6* 1.6*   Cardiac Enzymes: No results for input(s): CKTOTAL, CKMB, CKMBINDEX, TROPONINI in the last 168 hours.  BNP (last 3 results) No results for input(s): PROBNP in the last 8760 hours. HbA1C: No results for input(s): HGBA1C in the last 72 hours. CBG: Recent Labs  Lab 08/16/19 2004  GLUCAP 161*   Lipid Profile: No results for input(s): CHOL, HDL, LDLCALC, TRIG, CHOLHDL, LDLDIRECT in the last 72 hours. Thyroid Function Tests: No results for input(s): TSH, T4TOTAL, FREET4, T3FREE, THYROIDAB in the last 72 hours. Anemia Panel: No results for input(s): VITAMINB12, FOLATE, FERRITIN, TIBC, IRON, RETICCTPCT in the last 72 hours. Sepsis Labs: No results for input(s): PROCALCITON, LATICACIDVEN in the last 168 hours.  Recent Results (from the past 240 hour(s))  C difficile quick scan w PCR reflex     Status: None   Collection Time: 08/14/19  2:07 PM   Specimen: STOOL  Result Value  Ref Range Status   C Diff antigen NEGATIVE NEGATIVE Final   C Diff toxin NEGATIVE NEGATIVE Final   C Diff interpretation No C. difficile detected.  Final    Comment: Performed at [facility], [address] [place], [place] [address]  GI pathogen panel by PCR, stool     Status: None   Collection Time: 08/14/19  2:07 PM   Specimen: Stool  Result Value Ref Range Status   Plesiomonas shigelloides NOT DETECTED NOT DETECTED Final   Yersinia enterocolitica NOT DETECTED NOT DETECTED Final   Vibrio NOT DETECTED NOT DETECTED Final   Enteropathogenic E coli NOT DETECTED NOT DETECTED Final   E coli (ETEC) LT/ST NOT DETECTED NOT DETECTED Final   E coli [id number] by PCR Not applicable NOT DETECTED Final   Cryptosporidium by PCR NOT DETECTED NOT DETECTED Final   Entamoeba histolytica NOT DETECTED NOT DETECTED Final   Adenovirus F 40/41 NOT DETECTED NOT DETECTED Final   Norovirus GI/GII NOT DETECTED NOT DETECTED Final   Sapovirus NOT DETECTED NOT DETECTED Final    Comment: (NOTE) Performed At: [facility] [place], [place] [id number] Rush Farmer MD [id number]    Vibrio cholerae NOT DETECTED NOT DETECTED Final   Campylobacter by PCR NOT DETECTED NOT DETECTED Final   Salmonella by PCR NOT DETECTED NOT DETECTED Final   E coli (STEC) NOT DETECTED NOT DETECTED Final   Enteroaggregative E coli NOT DETECTED NOT DETECTED Final   Shigella by PCR NOT DETECTED NOT DETECTED Final   Cyclospora cayetanensis NOT DETECTED NOT DETECTED Final   Astrovirus NOT DETECTED NOT DETECTED Final   G lamblia by PCR NOT DETECTED NOT DETECTED Final   Rotavirus A by PCR NOT DETECTED NOT DETECTED Final  SARS CORONAVIRUS 2 (TAT 6-24 HRS) Nasopharyngeal Nasopharyngeal Swab     Status: None   Collection Time: 08/16/19 11:27 PM   Specimen: Nasopharyngeal Swab  Result Value Ref Range Status   SARS Coronavirus 2 NEGATIVE NEGATIVE Final    Comment: (NOTE) SARS-CoV-2 target nucleic acids are  NOT DETECTED. The SARS-CoV-2 RNA is generally detectable in upper and lower respiratory specimens during the acute phase of infection. Negative results do not preclude SARS-CoV-2 infection, do not rule out co-infections with other pathogens, and should not be used as the sole basis for treatment or other patient management decisions. Negative results must be combined with clinical observations, patient history, and epidemiological information. The expected result is Negative. Fact Sheet for Patients: SugarRoll.be Fact Sheet for Healthcare Providers: https://www.woods-mathews.com/ This test is not yet approved or cleared by the [place] FDA and  has been authorized for detection and/or diagnosis of SARS-CoV-2 by FDA under  an Emergency Use Authorization (EUA). This EUA will remain  in effect (meaning this test can be used) for the duration of the COVID-19 declaration under Section 56 4(b)(1) of the Act, 21 U.S.C. section 360bbb-3(b)(1), unless the authorization is terminated or revoked sooner. Performed at [facility], [place] [address], [place], [place] [address]          Radiology Studies: No results found.      Scheduled Meds: . apixaban  2.5 mg Oral BID  . aspirin EC  81 mg Oral Daily  . furosemide  40 mg Intravenous Q12H  . metoprolol succinate  50 mg Oral BID  . multivitamin with minerals   Oral Daily  . sodium chloride flush  3 mL Intravenous Q12H   Continuous Infusions:  Assessment & Plan:   Active Problems:   CKD (chronic kidney disease), stage III   Aortic stenosis   CHF (congestive heart failure) (HCC)   Acute kidney injury (AKI) with acute tubular necrosis (ATN) (HCC)   Enteritis   Gait instability   Acute kidney injury (HCC)   Atrial fibrillation with rapid ventricular response (HCC)   Abdominal pain   Acute respiratory distress   Acute on chronic systolic heart failure (HCC)   Acute enteritis  with associated abdominal pain and vomiting, differential infectious, inflammatory, less likely ischemic based on patency of mesenteric vessels. Afebrile Stool studies negative Resolved  Atrial fibrillation with rapid ventricular response:  S/p TEE CV In NSR On Eliquis with CHA2DS2-VASc7.  Will decrease toprol from 100 bid to 50mg  bid due to Sinus brady.     Reported blood with bowel movement.No new episode so far here  - patient reports for some time now he has had a little bit of blood with hard bowel movements. He is not aware of hemorrhoids in the past. He has not seen a GI doctor. Nothing here.  Stool guiac pending    Gait instability at home, uses a walker, no falls but reports unsteadiness. --PT evaluated recommended home health with PT and aide  -Case manager arranging.  Chronic combined systolic, diastolic CHF secondary to NICM followed by cardiology [facility], sec to HTN heart disease, aortic stenosis and PVCs. _still volume overloaded, will continue IV Lasix - echocardiogram this admission LVEF 25-30%, global hypokinesis. Ventricular diastolic parameters left ventricle indeterminate. Mildly reduced RV function systolic.  Cardiology following Strict I/o - Continue metoprolol succinate.  acei on hold Upon d/c: needs lasix 40mg  daily per cards.   Severe aortic stenosis seen on echocardiogram 08/14/2019 and 07/25/2018. TAVR as outpt.  CAD/Elevated troponin-, last cath 2016 widely patent coronary arteries, mild global left ventricular hypokinesis Likely 2/2 demand ischemia in the setting of acute enteritis and acute on chronic kidney disease and A. fib RVR cardiology following.   This is not consistent with ACS.  AKI superimposed on CKD stage IIIb --likely some from cardiorenal, continues to improve Continue to monitor  Abdominal aortic aneurysm 3.7 cm --Outpatient ultrasound in 2 years  PMH stroke w/ left eye impact --Continue statin. Plavix  stopped as the patient is now on anticoagulation.  Aortic atherosclerosis, asymptomatic --Continue statin  6 mm right lower lobe nodular opacity seen on chest CT 10/2018 during chart review. Recommend outpatient follow-up with consideration given to CAT scan in the next 3 months, by March.   DVT prophylaxis: Eliquis Code Status: Full Family Communication:  None at bedside Disposition Plan: Pt volume overloaded still, needs iv diuresis, as he has  critical AS. Likely will  be here 1-2 more days until more euvolemic and stable.     LOS: 8 days   Time spent: 45 minutes with more than 50% COC    Nolberto Hanlon, MD [facility] Pager [phone]  If 7PM-7AM, please contact night-coverage [email or web address] Password [email or web address] 08/22/2019, 1:24 PM Patient ID: JEREMIAN VELARDE, male   DOB: [birth year], 86 y.o.   MRN: [id number]

## 2019-08-22 NOTE — Progress Notes (Signed)
Progress Note  Patient Name: Clinton Holmes Date of Encounter: 08/22/2019  Primary Cardiologist: Fletcher Anon  Subjective   This admission with successful transesophageal echo  with cardioversion, normal sinus rhythm restored Continues to maintain normal sinus rhythm  Less short of breath, Ambulated yesterday with desaturation into the 70s on room air Kept overnight for continued diuresis and to set up oxygen at home (generator at his bedside currently)  Nurses report he needs continued redirection, continues to take his oxygen off When I walked in he was asleep, oxygen was not in place   Inpatient Medications    Scheduled Meds: . apixaban  2.5 mg Oral BID  . aspirin EC  81 mg Oral Daily  . furosemide  40 mg Intravenous Q12H  . metoprolol succinate  50 mg Oral BID  . multivitamin with minerals   Oral Daily  . sodium chloride flush  3 mL Intravenous Q12H   Continuous Infusions:  PRN Meds: acetaminophen **OR** acetaminophen, magnesium hydroxide, ondansetron **OR** ondansetron (ZOFRAN) IV, sodium chloride flush, traZODone   Vital Signs    Vitals:   08/22/19 0441 08/22/19 0738 08/22/19 1048 08/22/19 1053  BP:  132/75    Pulse:  62 66   Resp:  18    Temp:  (!) 97.5 F (36.4 C)    TempSrc:  Oral    SpO2:  100% 99% 94%  Weight: 106.8 kg     Height:        Intake/Output Summary (Last 24 hours) at 08/22/2019 1206 Last data filed at 08/22/2019 1039 Gross per 24 hour  Intake --  Output 1425 ml  Net -1425 ml   Filed Weights   08/20/19 0500 08/21/19 0500 08/22/19 0441  Weight: 110.9 kg 108.3 kg 106.8 kg    Telemetry    Normal sinus rhythm personally Reviewed  ECG    No new tracings - Personally Reviewed  Physical Exam   Constitutional: Was sleeping, woke him up, some disorientation but became increasingly alert, no distress.  HENT:  Head: Grossly normal Eyes:  no discharge. No scleral icterus.  Neck: No JVD, no carotid bruits  Cardiovascular: Regular rate and  rhythm, no murmurs appreciated Pulmonary/Chest: Scattered Rales at the bases Abdominal: Soft.  no distension.  no tenderness.  Musculoskeletal: Normal range of motion Neurological:  normal muscle tone. Coordination normal. No atrophy Skin: Skin warm and dry Psychiatric: normal affect, pleasant   Labs    Chemistry Recent Labs  Lab 08/19/19 0755 08/20/19 0304 08/21/19 0212 08/22/19 0408  NA 139 141 143  --   K 4.1 4.1 3.4*  --   CL 103 101 100  --   CO2 27 31 32  --   GLUCOSE 94 99 100*  --   BUN 39* 37* 36*  --   CREATININE 1.93* 1.87* 1.61* 1.69*  CALCIUM 9.4 9.6 9.1  --   GFRNONAA 30* 31* 37* 35*  GFRAA 35* 36* 43* 41*  ANIONGAP 9 9 11   --      Hematology Recent Labs  Lab 08/16/19 2008 08/17/19 0753 08/20/19 0304  WBC 11.5* 10.8* 9.2  RBC 4.66 4.79 4.45  HGB 13.6 13.5 12.8*  HCT 40.9 43.5 41.2  MCV 87.8 90.8 92.6  MCH 29.2 28.2 28.8  MCHC 33.3 31.0 31.1  RDW 15.3 15.2 15.1  PLT 181 171 163    Cardiac EnzymesNo results for input(s): TROPONINI in the last 168 hours. No results for input(s): TROPIPOC in the last 168 hours.  BNP Recent Labs  Lab 08/16/19 2007  BNP 2,308.0*     DDimer No results for input(s): DDIMER in the last 168 hours.   Radiology    DG Chest Port 1 View  Result Date: 08/16/2019 IMPRESSION: Cardiomegaly with mild, diffuse interstitial pulmonary opacity and small, layering bilateral pleural effusions, likely edema. Atypical/viral infection is a differential consideration. Electronically Signed   By: Eddie Candle M.D.   On: 08/16/2019 20:35    Cardiac Studies   2D Echo 08/14/2019: 1. Left ventricular ejection fraction, by visual estimation, is 25 to 30%. The left ventricle has severely decreased function. There is mildly increased left ventricular hypertrophy. 2. Left ventricular diastolic parameters are indeterminate. 3. The left ventricle demonstrates global hypokinesis. 4. Global right ventricle has mildly reduced systolic  function.The right ventricular size is normal. No increase in right ventricular wall thickness. 5. Left atrial size was mildly dilated. 6. Right atrial size was normal. 7. The mitral valve is grossly normal. Trivial mitral valve regurgitation. 8. The tricuspid valve is normal in structure. 9. Aortic valve area, by VTI measures 0.65 cm. 10. Aortic valve mean gradient measures 22.0 mmHg. 11. Aortic valve peak gradient measures 41.5 mmHg. 12. The aortic valve is abnormal. Aortic valve regurgitation is not visualized. Severe aortic valve stenosis. 13. The pulmonic valve was not well visualized. Pulmonic valve regurgitation is not visualized. 14. Moderately elevated pulmonary artery systolic pressure. 15. The inferior vena cava is dilated in size with <50% respiratory variability, suggesting right atrial pressure of 15 mmHg. __________  24-hour Holter 07/2018: Normal sinus rhythm,  Avg rate 64 bpm  Rare PAC (<1%)  Frequent Ventricular beats (35%), total of 31, 721  23K isolated beats 1,134 couplets 13,950 bigeminal beats NSVT, longest run 26 beats, 1,037 runs total (5,740 beats)  Patient Profile     86 y.o. male with history of mild nonobstructive CAD by LHC in 2016, HFrEF secondary to NICM, moderate to severe aortic stenosis, pulmonary hypertension, possible TIA/CVA with left visual changes, acoustic neuroma s/p gamma knife procedure at [facility] in 1990s, syncope, CKD stage III, frequent PVCs, HTN, and HLD who is being seen today for the evaluation of new onset Afib with RVR.  Assessment & Plan    1. New onset Afib with RVR:  in the setting of acute enteritis  -With cardiomyopathy and underlying severe aortic stenosis Underwent successful TEE-guided DCCV this admission Continues to maintain normal sinus rhythm Continue metoprolol, eliquis. Metoprolol held last night for heart rates in the 60s Hospitalist has decreased metoprolol down to 50 twice daily -CHADS2VASc at least 7  (CHF, HTN, age x 2, TIA/CVA x 2, vascular disease) Low-dose Eliquis given age and renal dysfunction   2. Elevated troponin/nonobstructive CAD: -Prior cath with nonobstructive disease 2016 Demand ischemia this admission in the setting of atrial fibrillation, respiratory distress -Further ischemic work-up per TAVR team.  More challenging in light of renal dysfunction  3. HFrEF secondary to NICM/pulmonary hypertension: Reduced ejection fraction in the setting of atrial fibrillation, possibly tachycardia mediated -Not currently on ACE-I/ARB/spironolactone/Entresto secondary to acute on CKD  --- Would continue Lasix IV twice daily --Ambulate again monitor saturations -----He will likely require Lasix 40 twice daily at discharge with close monitoring as outpatient  4. Aortic stenosis: -Severe by echo , both TTE and TEE Consult placed for TAVR work-up in [facility]  family aware, patient aware  5. Possible TIA/CVA: -Transitioned from Plavix to Eliquis and ASA this admission,  -Intolerant to low dose Crestor and has declined  rechallenge of statins  6. Acute on CKD stage III: -Renal function stable -Lisinopril held, not on ARB or Entresto Continue Lasix today for acute systolic CHF symptoms with close monitoring of renal function  7. Abdominal aortic aneurysm: -Outpatient follow up   9. Enteritis/hematochezia: -He will need close monitoring  Now on NOAC, post cardioversion Hematocrit stable   Total encounter time more than 25 minutes  Greater than 50% was spent in counseling and coordination of care with the patient   For questions or updates, please contact [place] Please consult [email or web address] for contact info under Cardiology/STEMI.    Signed, Esmond Plants, MD, Ph.D [facility]

## 2019-08-23 DIAGNOSIS — J9600 Acute respiratory failure, unspecified whether with hypoxia or hypercapnia: Secondary | ICD-10-CM

## 2019-08-23 DIAGNOSIS — I5043 Acute on chronic combined systolic (congestive) and diastolic (congestive) heart failure: Secondary | ICD-10-CM

## 2019-08-23 DIAGNOSIS — I420 Dilated cardiomyopathy: Secondary | ICD-10-CM

## 2019-08-23 LAB — BASIC METABOLIC PANEL
Anion gap: 10 (ref 5–15)
BUN: 27 mg/dL — ABNORMAL HIGH (ref 8–23)
CO2: 35 mmol/L — ABNORMAL HIGH (ref 22–32)
Calcium: 9.4 mg/dL (ref 8.9–10.3)
Chloride: 96 mmol/L — ABNORMAL LOW (ref 98–111)
Creatinine, Ser: 1.65 mg/dL — ABNORMAL HIGH (ref 0.61–1.24)
GFR calc Af Amer: 42 mL/min — ABNORMAL LOW (ref >60)
GFR calc non Af Amer: 36 mL/min — ABNORMAL LOW (ref >60)
Glucose, Bld: 98 mg/dL (ref 70–99)
Potassium: 4.2 mmol/L (ref 3.5–5.1)
Sodium: 141 mmol/L (ref 135–145)

## 2019-08-23 LAB — CBC
HCT: 44.9 % (ref 39.0–52.0)
Hemoglobin: 14.2 g/dL (ref 13.0–17.0)
MCH: 28.9 pg (ref 26.0–34.0)
MCHC: 31.6 g/dL (ref 30.0–36.0)
MCV: 91.3 fL (ref 80.0–100.0)
Platelets: 168 10*3/uL (ref 150–400)
RBC: 4.92 MIL/uL (ref 4.22–5.81)
RDW: 15 % (ref 11.5–15.5)
WBC: 10.9 10*3/uL — ABNORMAL HIGH (ref 4.0–10.5)
nRBC: 0 % (ref 0.0–0.2)

## 2019-08-23 MED ORDER — FUROSEMIDE 40 MG PO TABS: 40.0000 mg | ORAL_TABLET | Freq: Two times a day (BID) | ORAL | Status: DC

## 2019-08-23 MED ORDER — APIXABAN 2.5 MG PO TABS: 2.5000 mg | ORAL_TABLET | Freq: Two times a day (BID) | ORAL | 0 refills | Status: DC

## 2019-08-23 MED ORDER — METOPROLOL SUCCINATE ER 50 MG PO TB24: 50.0000 mg | ORAL_TABLET | Freq: Two times a day (BID) | ORAL | 0 refills | Status: DC

## 2019-08-23 MED ORDER — ASPIRIN 81 MG PO TBEC: 81.0000 mg | DELAYED_RELEASE_TABLET | Freq: Every day | ORAL | Status: DC

## 2019-08-23 MED ORDER — FUROSEMIDE 40 MG PO TABS: 40.0000 mg | ORAL_TABLET | Freq: Two times a day (BID) | ORAL | 0 refills | Status: DC

## 2019-08-23 NOTE — Discharge Instructions (Signed)
Atrial Fibrillation  Atrial fibrillation is a type of heartbeat that is irregular or fast. If you have this condition, your heart beats without any order. This makes it hard for your heart to pump blood in a normal way. Atrial fibrillation may come and go, or it may become a long-lasting problem. If this condition is not treated, it can put you at higher risk for stroke, heart failure, and other heart problems. What are the causes? This condition may be caused by diseases that damage the heart. They include:  High blood pressure.  Heart failure.  Heart valve disease.  Heart surgery. Other causes include:  Diabetes.  Thyroid disease.  Being overweight.  Kidney disease. Sometimes the cause is not known. What increases the risk? You are more likely to develop this condition if:  You are older.  You smoke.  You exercise often and very hard.  You have a family history of this condition.  You are a man.  You use drugs.  You drink a lot of alcohol.  You have lung conditions, such as emphysema, pneumonia, or COPD.  You have sleep apnea. What are the signs or symptoms? Common symptoms of this condition include:  A feeling that your heart is beating very fast.  Chest pain or discomfort.  Feeling short of breath.  Suddenly feeling light-headed or weak.  Getting tired easily during activity.  Fainting.  Sweating. In some cases, there are no symptoms. How is this treated? Treatment for this condition depends on underlying conditions and how you feel when you have atrial fibrillation. They include:  Medicines to: ? Prevent blood clots. ? Treat heart rate or heart rhythm problems.  Using devices, such as a pacemaker, to correct heart rhythm problems.  Doing surgery to remove the part of the heart that sends bad signals.  Closing an area where clots can form in the heart (left atrial appendage). In some cases, your doctor will treat other underlying  conditions. Follow these instructions at home: Medicines  Take over-the-counter and prescription medicines only as told by your doctor.  Do not take any new medicines without first talking to your doctor.  If you are taking blood thinners: ? Talk with your doctor before you take any medicines that have aspirin or NSAIDs, such as ibuprofen, in them. ? Take your medicine exactly as told by your doctor. Take it at the same time each day. ? Avoid activities that could hurt or bruise you. Follow instructions about how to prevent falls. ? Wear a bracelet that says you are taking blood thinners. Or, carry a card that lists what medicines you take. Lifestyle      Do not use any products that have nicotine or tobacco in them. These include cigarettes, e-cigarettes, and chewing tobacco. If you need help quitting, ask your doctor.  Eat heart-healthy foods. Talk with your doctor about the right eating plan for you.  Exercise regularly as told by your doctor.  Do not drink alcohol.  Lose weight if you are overweight.  Do not use drugs, including cannabis. General instructions  If you have a condition that causes breathing to stop for a short period of time (apnea), treat it as told by your doctor.  Keep a healthy weight. Do not use diet pills unless your doctor says they are safe for you. Diet pills may make heart problems worse.  Keep all follow-up visits as told by your doctor. This is important. Contact a doctor if:  You notice a change   in the speed, rhythm, or strength of your heartbeat.  You are taking a blood-thinning medicine and you get more bruising.  You get tired more easily when you move or exercise.  You have a sudden change in weight. Get help right away if:   You have pain in your chest or your belly (abdomen).  You have trouble breathing.  You have side effects of blood thinners, such as blood in your vomit, poop (stool), or pee (urine), or bleeding that cannot  stop.  You have any signs of a stroke. "BE FAST" is an easy way to remember the main warning signs: ? B - Balance. Signs are dizziness, sudden trouble walking, or loss of balance. ? E - Eyes. Signs are trouble seeing or a change in how you see. ? F - Face. Signs are sudden weakness or loss of feeling in the face, or the face or eyelid drooping on one side. ? A - Arms. Signs are weakness or loss of feeling in an arm. This happens suddenly and usually on one side of the body. ? S - Speech. Signs are sudden trouble speaking, slurred speech, or trouble understanding what people say. ? T - Time. Time to call emergency services. Write down what time symptoms started.  You have other signs of a stroke, such as: ? A sudden, very bad headache with no known cause. ? Feeling like you may vomit (nausea). ? Vomiting. ? A seizure. These symptoms may be an emergency. Do not wait to see if the symptoms will go away. Get medical help right away. Call your local emergency services (911 in the [place] Do not drive yourself to the hospital. Summary  Atrial fibrillation is a type of heartbeat that is irregular or fast.  You are at higher risk of this condition if you smoke, are older, have diabetes, or are overweight.  Follow your doctor's instructions about medicines, diet, exercise, and follow-up visits.  Get help right away if you have signs or symptoms of a stroke.  Get help right away if you cannot catch your breath, or you have chest pain or discomfort. This information is not intended to replace advice given to you by your health care provider. Make sure you discuss any questions you have with your health care provider. Document Revised: 01/21/2019 Document Reviewed: 01/21/2019 Elsevier Patient Education  [place].   Acute Kidney Injury, Adult  Acute kidney injury is a sudden worsening of kidney function. The kidneys are organs that have several jobs. They filter the blood to remove  waste products and extra fluid. They also maintain a healthy balance of minerals and hormones in the body, which helps control blood pressure and keep bones strong. With this condition, your kidneys do not do their jobs as well as they should. This condition ranges from mild to severe. Over time it may develop into long-lasting (chronic) kidney disease. Early detection and treatment may prevent acute kidney injury from developing into a chronic condition. What are the causes? Common causes of this condition include:  A problem with blood flow to the kidneys. This may be caused by: ? Low blood pressure (hypotension) or shock. ? Blood loss. ? Heart and blood vessel (cardiovascular) disease. ? Severe burns. ? Liver disease.  Direct damage to the kidneys. This may be caused by: ? Certain medicines. ? A kidney infection. ? Poisoning. ? Being around or in contact with toxic substances. ? A surgical wound. ? A hard, direct hit to the kidney  area.  A sudden blockage of urine flow. This may be caused by: ? Cancer. ? Kidney stones. ? An enlarged prostate in males. What are the signs or symptoms? Symptoms of this condition may not be obvious until the condition becomes severe. Symptoms of this condition can include:  Tiredness (lethargy), or difficulty staying awake.  Nausea or vomiting.  Swelling (edema) of the face, legs, ankles, or feet.  Problems with urination, such as: ? Abdominal pain, or pain along the side of your stomach (flank). ? Decreased urine production. ? Decrease in the force of urine flow.  Muscle twitches and cramps, especially in the legs.  Confusion or trouble concentrating.  Loss of appetite.  Fever. How is this diagnosed? This condition may be diagnosed with tests, including:  Blood tests.  Urine tests.  Imaging tests.  A test in which a sample of tissue is removed from the kidneys to be examined under a microscope (kidney biopsy). How is this  treated? Treatment for this condition depends on the cause and how severe the condition is. In mild cases, treatment may not be needed. The kidneys may heal on their own. In more severe cases, treatment will involve:  Treating the cause of the kidney injury. This may involve changing any medicines you are taking or adjusting your dosage.  Fluids. You may need specialized IV fluids to balance your body's needs.  Having a catheter placed to drain urine and prevent blockages.  Preventing problems from occurring. This may mean avoiding certain medicines or procedures that can cause further injury to the kidneys. In some cases treatment may also require:  A procedure to remove toxic wastes from the body (dialysis or continuous renal replacement therapy - CRRT).  Surgery. This may be done to repair a torn kidney, or to remove the blockage from the urinary system. Follow these instructions at home: Medicines  Take over-the-counter and prescription medicines only as told by your health care provider.  Do not take any new medicines without your health care provider's approval. Many medicines can worsen your kidney damage.  Do not take any vitamin and mineral supplements without your health care provider's approval. Many nutritional supplements can worsen your kidney damage. Lifestyle  If your health care provider prescribed changes to your diet, follow them. You may need to decrease the amount of protein you eat.  Achieve and maintain a healthy weight. If you need help with this, ask your health care provider.  Start or continue an exercise plan. Try to exercise at least 30 minutes a day, 5 days a week.  Do not use any tobacco products, such as cigarettes, chewing tobacco, and e-cigarettes. If you need help quitting, ask your health care provider. General instructions  Keep track of your blood pressure. Report changes in your blood pressure as told by your health care provider.  Stay up to  date with immunizations. Ask your health care provider which immunizations you need.  Keep all follow-up visits as told by your health care provider. This is important. Where to find more information  American Association of Kidney Patients: BombTimer.gl  National Kidney Foundation: www.kidney.[place]: https://mathis.com/  Life Options Rehabilitation Program: ? www.lifeoptions.org ? www.kidneyschool.org Contact a health care provider if:  Your symptoms get worse.  You develop new symptoms. Get help right away if:  You develop symptoms of worsening kidney disease, which include: ? Headaches. ? Abnormally dark or light skin. ? Easy bruising. ? Frequent hiccups. ? Chest pain. ? Shortness  of breath. ? End of menstruation in women. ? Seizures. ? Confusion or altered mental status. ? Abdominal or back pain. ? Itchiness.  You have a fever.  Your body is producing less urine.  You have pain or bleeding when you urinate. Summary  Acute kidney injury is a sudden worsening of kidney function.  Acute kidney injury can be caused by problems with blood flow to the kidneys, direct damage to the kidneys, and sudden blockage of urine flow.  Symptoms of this condition may not be obvious until it becomes severe. Symptoms may include edema, lethargy, confusion, nausea or vomiting, and problems passing urine.  This condition can usually be diagnosed with blood tests, urine tests, and imaging tests. Sometimes a kidney biopsy is done to diagnose this condition.  Treatment for this condition often involves treating the underlying cause. It is treated with fluids, medicines, dialysis, diet changes, or surgery. This information is not intended to replace advice given to you by your health care provider. Make sure you discuss any questions you have with your health care provider. Document Revised: 07/12/2017 Document Reviewed: 07/20/2016 Elsevier Patient Education  2020  [place].   Acute Kidney Injury, Adult  Acute kidney injury is a sudden worsening of kidney function. The kidneys are organs that have several jobs. They filter the blood to remove waste products and extra fluid. They also maintain a healthy balance of minerals and hormones in the body, which helps control blood pressure and keep bones strong. With this condition, your kidneys do not do their jobs as well as they should. This condition ranges from mild to severe. Over time it may develop into long-lasting (chronic) kidney disease. Early detection and treatment may prevent acute kidney injury from developing into a chronic condition. What are the causes? Common causes of this condition include:  A problem with blood flow to the kidneys. This may be caused by: ? Low blood pressure (hypotension) or shock. ? Blood loss. ? Heart and blood vessel (cardiovascular) disease. ? Severe burns. ? Liver disease.  Direct damage to the kidneys. This may be caused by: ? Certain medicines. ? A kidney infection. ? Poisoning. ? Being around or in contact with toxic substances. ? A surgical wound. ? A hard, direct hit to the kidney area.  A sudden blockage of urine flow. This may be caused by: ? Cancer. ? Kidney stones. ? An enlarged prostate in males. What are the signs or symptoms? Symptoms of this condition may not be obvious until the condition becomes severe. Symptoms of this condition can include:  Tiredness (lethargy), or difficulty staying awake.  Nausea or vomiting.  Swelling (edema) of the face, legs, ankles, or feet.  Problems with urination, such as: ? Abdominal pain, or pain along the side of your stomach (flank). ? Decreased urine production. ? Decrease in the force of urine flow.  Muscle twitches and cramps, especially in the legs.  Confusion or trouble concentrating.  Loss of appetite.  Fever. How is this diagnosed? This condition may be diagnosed with tests,  including:  Blood tests.  Urine tests.  Imaging tests.  A test in which a sample of tissue is removed from the kidneys to be examined under a microscope (kidney biopsy). How is this treated? Treatment for this condition depends on the cause and how severe the condition is. In mild cases, treatment may not be needed. The kidneys may heal on their own. In more severe cases, treatment will involve:  Treating the cause of  the kidney injury. This may involve changing any medicines you are taking or adjusting your dosage.  Fluids. You may need specialized IV fluids to balance your body's needs.  Having a catheter placed to drain urine and prevent blockages.  Preventing problems from occurring. This may mean avoiding certain medicines or procedures that can cause further injury to the kidneys. In some cases treatment may also require:  A procedure to remove toxic wastes from the body (dialysis or continuous renal replacement therapy - CRRT).  Surgery. This may be done to repair a torn kidney, or to remove the blockage from the urinary system. Follow these instructions at home: Medicines  Take over-the-counter and prescription medicines only as told by your health care provider.  Do not take any new medicines without your health care provider's approval. Many medicines can worsen your kidney damage.  Do not take any vitamin and mineral supplements without your health care provider's approval. Many nutritional supplements can worsen your kidney damage. Lifestyle  If your health care provider prescribed changes to your diet, follow them. You may need to decrease the amount of protein you eat.  Achieve and maintain a healthy weight. If you need help with this, ask your health care provider.  Start or continue an exercise plan. Try to exercise at least 30 minutes a day, 5 days a week.  Do not use any tobacco products, such as cigarettes, chewing tobacco, and e-cigarettes. If you need help  quitting, ask your health care provider. General instructions  Keep track of your blood pressure. Report changes in your blood pressure as told by your health care provider.  Stay up to date with immunizations. Ask your health care provider which immunizations you need.  Keep all follow-up visits as told by your health care provider. This is important. Where to find more information  American Association of Kidney Patients: BombTimer.gl  National Kidney Foundation: www.kidney.[place]: https://mathis.com/  Life Options Rehabilitation Program: ? www.lifeoptions.org ? www.kidneyschool.org Contact a health care provider if:  Your symptoms get worse.  You develop new symptoms. Get help right away if:  You develop symptoms of worsening kidney disease, which include: ? Headaches. ? Abnormally dark or light skin. ? Easy bruising. ? Frequent hiccups. ? Chest pain. ? Shortness of breath. ? End of menstruation in women. ? Seizures. ? Confusion or altered mental status. ? Abdominal or back pain. ? Itchiness.  You have a fever.  Your body is producing less urine.  You have pain or bleeding when you urinate. Summary  Acute kidney injury is a sudden worsening of kidney function.  Acute kidney injury can be caused by problems with blood flow to the kidneys, direct damage to the kidneys, and sudden blockage of urine flow.  Symptoms of this condition may not be obvious until it becomes severe. Symptoms may include edema, lethargy, confusion, nausea or vomiting, and problems passing urine.  This condition can usually be diagnosed with blood tests, urine tests, and imaging tests. Sometimes a kidney biopsy is done to diagnose this condition.  Treatment for this condition often involves treating the underlying cause. It is treated with fluids, medicines, dialysis, diet changes, or surgery. This information is not intended to replace advice given to you by your health  care provider. Make sure you discuss any questions you have with your health care provider. Document Revised: 07/12/2017 Document Reviewed: 07/20/2016 Elsevier Patient Education  2020 [employer].

## 2019-08-23 NOTE — Progress Notes (Signed)
02 off with pulse ox on RA; denies SOB/difficulty breathing. Denies co's/concerns. Verbalizes desire to return home asap; agrees discharge plans.

## 2019-08-23 NOTE — Progress Notes (Signed)
Progress Note  Patient Name: Clinton Holmes Date of Encounter: 08/23/2019  Primary Cardiologist: Fletcher Anon  Subjective   This admission with successful transesophageal echo  with cardioversion, normal sinus rhythm restored Continues to maintain normal sinus rhythm  Good urine output yesterday on IV Lasix, He denies having significant shortness of breath and feels ready to go home No family at the bedside  Inpatient Medications    Scheduled Meds: . apixaban  2.5 mg Oral BID  . aspirin EC  81 mg Oral Daily  . furosemide  40 mg Oral BID  . metoprolol succinate  50 mg Oral BID  . multivitamin with minerals   Oral Daily  . sodium chloride flush  3 mL Intravenous Q12H   Continuous Infusions:  PRN Meds: magnesium hydroxide, ondansetron **OR** ondansetron (ZOFRAN) IV, sodium chloride flush, traZODone   Vital Signs    Vitals:   08/22/19 2018 08/22/19 2354 08/23/19 0500 08/23/19 0726  BP: 123/89 108/75  136/85  Pulse: 68 73  63  Resp: 16 18    Temp: 98.3 F (36.8 C) 97.7 F (36.5 C)  97.6 F (36.4 C)  TempSrc: Oral Oral  Oral  SpO2: 97% 95%  95%  Weight:   104.7 kg   Height:        Intake/Output Summary (Last 24 hours) at 08/23/2019 1317 Last data filed at 08/23/2019 1220 Gross per 24 hour  Intake 243 ml  Output 1950 ml  Net -1707 ml   Filed Weights   08/21/19 0500 08/22/19 0441 08/23/19 0500  Weight: 108.3 kg 106.8 kg 104.7 kg    Telemetry    Normal sinus rhythm-personally Reviewed  ECG    No new tracings - Personally Reviewed  Physical Exam   Constitutional:  oriented to person, place, and time. No distress.  HENT:  Head: Normocephalic and atraumatic.  Eyes:  no discharge. No scleral icterus.  Neck: Normal range of motion. Neck supple. No JVD present.  Cardiovascular: Normal rate, regular rhythm, normal heart sounds and intact distal pulses. Exam reveals no gallop and no friction rub. No 0000000 systolic ejection murmur right sternal  border Pulmonary/Chest: Effort normal and breath sounds normal. No stridor. No respiratory distress.  no wheezes.  no rales.  no tenderness.  Abdominal: Soft.  no distension.  no tenderness.  Musculoskeletal: Normal range of motion.  no  tenderness or deformity.  Neurological:  normal muscle tone. Coordination normal. No atrophy Skin: Skin is warm and dry. No rash noted. not diaphoretic.  Psychiatric:  normal mood and affect. behavior is normal. Thought content normal.    Labs    Chemistry Recent Labs  Lab 08/20/19 0304 08/21/19 0212 08/22/19 0408 08/23/19 0625  NA 141 143  --  141  K 4.1 3.4*  --  4.2  CL 101 100  --  96*  CO2 31 32  --  35*  GLUCOSE 99 100*  --  98  BUN 37* 36*  --  27*  CREATININE 1.87* 1.61* 1.69* 1.65*  CALCIUM 9.6 9.1  --  9.4  GFRNONAA 31* 37* 35* 36*  GFRAA 36* 43* 41* 42*  ANIONGAP 9 11  --  10     Hematology Recent Labs  Lab 08/17/19 0753 08/20/19 0304 08/23/19 0625  WBC 10.8* 9.2 10.9*  RBC 4.79 4.45 4.92  HGB 13.5 12.8* 14.2  HCT 43.5 41.2 44.9  MCV 90.8 92.6 91.3  MCH 28.2 28.8 28.9  MCHC 31.0 31.1 31.6  RDW 15.2 15.1 15.0  PLT 171 163 168    Cardiac EnzymesNo results for input(s): TROPONINI in the last 168 hours. No results for input(s): TROPIPOC in the last 168 hours.   BNP Recent Labs  Lab 08/16/19 2007  BNP 2,308.0*     DDimer No results for input(s): DDIMER in the last 168 hours.   Radiology    DG Chest Port 1 View  Result Date: 08/16/2019 IMPRESSION: Cardiomegaly with mild, diffuse interstitial pulmonary opacity and small, layering bilateral pleural effusions, likely edema. Atypical/viral infection is a differential consideration. Electronically Signed   By: Eddie Candle M.D.   On: 08/16/2019 20:35    Cardiac Studies   2D Echo 08/14/2019: 1. Left ventricular ejection fraction, by visual estimation, is 25 to 30%. The left ventricle has severely decreased function. There is mildly increased left ventricular  hypertrophy. 2. Left ventricular diastolic parameters are indeterminate. 3. The left ventricle demonstrates global hypokinesis. 4. Global right ventricle has mildly reduced systolic function.The right ventricular size is normal. No increase in right ventricular wall thickness. 5. Left atrial size was mildly dilated. 6. Right atrial size was normal. 7. The mitral valve is grossly normal. Trivial mitral valve regurgitation. 8. The tricuspid valve is normal in structure. 9. Aortic valve area, by VTI measures 0.65 cm. 10. Aortic valve mean gradient measures 22.0 mmHg. 11. Aortic valve peak gradient measures 41.5 mmHg. 12. The aortic valve is abnormal. Aortic valve regurgitation is not visualized. Severe aortic valve stenosis. 13. The pulmonic valve was not well visualized. Pulmonic valve regurgitation is not visualized. 14. Moderately elevated pulmonary artery systolic pressure. 15. The inferior vena cava is dilated in size with <50% respiratory variability, suggesting right atrial pressure of 15 mmHg. __________  24-hour Holter 07/2018: Normal sinus rhythm,  Avg rate 64 bpm  Rare PAC (<1%)  Frequent Ventricular beats (35%), total of 31, 721  23K isolated beats 1,134 couplets 13,950 bigeminal beats NSVT, longest run 26 beats, 1,037 runs total (5,740 beats)  Patient Profile     86 y.o. male with history of mild nonobstructive CAD by LHC in 2016, HFrEF secondary to NICM, moderate to severe aortic stenosis, pulmonary hypertension, possible TIA/CVA with left visual changes, acoustic neuroma s/p gamma knife procedure at [facility] in 1990s, syncope, CKD stage III, frequent PVCs, HTN, and HLD who is being seen today for the evaluation of new onset Afib with RVR.  Assessment & Plan    1. New onset Afib with RVR:  in the setting of acute enteritis  -With cardiomyopathy and underlying severe aortic stenosis Underwent successful TEE-guided DCCV this admission On metoprolol and Eliquis  maintaining normal sinus rhythm -CHADS2VASc at least 7 (CHF, HTN, age x 2, TIA/CVA x 2, vascular disease) Low-dose Eliquis as he meets criteria based on age and renal dysfunction   2. Elevated troponin/nonobstructive CAD: -Prior cath with nonobstructive disease 2016 Demand ischemia this admission in the setting of atrial fibrillation, respiratory distress --He may require additional ischemic work-up prior to TAVR.  Will defer to the [facility] team .  He does have chronic renal sufficiency  3. HFrEF secondary to NICM/pulmonary hypertension: Reduced ejection fraction in the setting of atrial fibrillation, possibly tachycardia mediated -Not currently on ACE-I/ARB/spironolactone/Entresto secondary to acute on CKD  --- He has had aggressive diuresis on IV Lasix twice daily -We will transition to Lasix 40 twice daily As outpatient could potentially decrease down to 40 daily  4. Aortic stenosis: -Severe by echo , both TTE and TEE Consult placed for TAVR work-up in [facility]  Will likely need ischemic work-up, last catheterization several years ago  5. Possible TIA/CVA: On Eliquis and ASA   6. Acute on CKD stage III: -Renal function stable Likely component of cardiorenal syndrome -Lisinopril held, not on ARB or Entresto Potentially could restart lisinopril as outpatient on recheck of creatinine  7. Abdominal aortic aneurysm: -Outpatient follow up   9. Enteritis/hematochezia: On NOAC He reports no further GI issues   Total encounter time more than 25 minutes  Greater than 50% was spent in counseling and coordination of care with the patient   For questions or updates, please contact [place] Please consult [email or web address] for contact info under Cardiology/STEMI.    Signed, Esmond Plants, MD, Ph.D [facility]

## 2019-08-23 NOTE — Progress Notes (Signed)
Oral and written AVS instructions to pt and dgt with stated understanding. Ready for discharge home to self/family care.

## 2019-08-23 NOTE — Discharge Summary (Signed)
[name] [id number] DOB: 1928/05/11 DOA: 08/14/2019  PCP: Philmore Pali, NP  Admit date: 08/14/2019 Discharge date: 08/23/2019  Admitted From: home Disposition:  home  Recommendations for Outpatient Follow-up:  1. Follow up with PCP in 1 week 2. Please obtain BMP/CBC in one week 3. Please follow up on the following pending results:none 4. Primary cardiologist in 2 days  Home Health:yes   Discharge Condition:Stable CODE STATUS:Full  Diet recommendation: Heart Healthy -low sodium  Brief/Interim Summary: Clinton Holmes  is a 86 y.o. Caucasian male with a known history of coronary artery disease, stage III chronic kidney disease, hypertension and osteoporosis presenting with acute onset of abdominal pain mainly in the left lower quadrant with associated recurrent vomiting including once in the lobby. EKG showed atrial fibrillation with rapid ventricular response with a rate of 117 with Q waves anteroseptally and inferiorly.  Abdominal pelvic CT scan was remarkable for signs of enteritis likely infectious or inflammatory and less likely ischemic given the length of the bowel involvement and patency of mesenteric vessels.  Also showed infrarenal aortic aneurysm with diameter of 3.7 cm with recommendation for ultrasound in 2 years.  It also showed small bilateral pleural effusions with adjacent atelectasis and mild cardiomegaly.  An echocardiogram was obtained and cardiology was consulted for his A. fib with RVR.  Echo revealed EF of 25 to 30% with aortic valve mean gradient of 22.0 mmHg and peak measure of 41.5 mmHg with aortic valve area of 0.65 cm.  Patient had reported blood in the bowel movement however none here and his stool guaiac was still pending.  No episodes were detected here.  His acute enteritis improved/resolved.  He remained afebrile.  He underwent TEE cardioversion and was started on Eliquis since his chads vas score of was 7.  He was started on Toprol XL 100 mg p.o. twice  daily however due to bradycardia he did not tolerated and it was decreased to 50 mg twice daily which he has tolerated.  He had TEE cardioversion on 1/6.  No significant bleeding was noted during his hospitalization and external exam by our team revealed external hemorrhoids.  He tolerated the Eliquis.  Once he underwent TEE cardioversion he remained sinus rhythm.  He was mildly volume overloaded and was started on Lasix 40 mg IV twice daily and as today he is euvolemic he is switched to Lasix 40 mg twice daily per cardiology Dr. Donivan Scull recommendation to me.  He was found with near AAS and now that he is euvolemic to be discharged home there plans for sending him up for TAVR as outpatient.  During his hospitalization he also had elevated troponin and his last cath was in 2016 revealing widely patent coronary arteries with mild global left ventricular hypokinesis.  Cardiology thought this is likely to demand ischemia in the setting of acute enteritis and acute on chronic kidney disease and A. fib RVR.  They did not feel that this was due to ACS.  He is stable from cardiac standpoint to be discharged home.  Discharge Diagnoses:  Active Problems:   CKD (chronic kidney disease), stage III   Aortic stenosis   CHF (congestive heart failure) (HCC)   Acute kidney injury (AKI) with acute tubular necrosis (ATN) (HCC)   Enteritis   Gait instability   Acute kidney injury (HCC)   Atrial fibrillation with rapid ventricular response (HCC)   Abdominal pain   Acute respiratory distress   Acute on chronic systolic heart failure ([place])  Discharge Instructions  Discharge Instructions    Call MD for:  difficulty breathing, headache or visual disturbances   Complete by: As directed    Diet - low sodium heart healthy   Complete by: As directed    Discharge instructions   Complete by: As directed    F/u with primary cardiologist in 2 days F/u with pcp in one week-needs blood work   Increase activity slowly    Complete by: As directed      Allergies as of 08/23/2019   No Known Allergies     Medication List    STOP taking these medications   clopidogrel 75 MG tablet Commonly known as: PLAVIX   lisinopril 40 MG tablet Commonly known as: ZESTRIL     TAKE these medications   apixaban 2.5 MG Tabs tablet Commonly known as: ELIQUIS Take 1 tablet (2.5 mg total) by mouth 2 (two) times daily.   aspirin 81 MG EC tablet Take 1 tablet (81 mg total) by mouth daily. Start taking on: August 24, 2019   furosemide 40 MG tablet Commonly known as: LASIX Take 1 tablet (40 mg total) by mouth 2 (two) times daily. What changed:   medication strength  how much to take  when to take this   metoprolol succinate 50 MG 24 hr tablet Commonly known as: TOPROL-XL Take 1 tablet (50 mg total) by mouth 2 (two) times daily. Take with or immediately following a meal. What changed: when to take this   MULTIVITAMIN ADULTS 50+ PO Take 1 tablet by mouth daily.            Durable Medical Equipment  (From admission, onward)         Start     Ordered   08/21/19 1319  For home use only DME oxygen  Once    Comments: 2 L  Question Answer Comment  Length of Need 6 Months   Mode or (Route) Nasal cannula   Frequency Continuous (stationary and portable oxygen unit needed)   Oxygen delivery system Gas      08/21/19 1318   08/18/19 0741  For home use only DME Walker rolling  Once    Question:  Patient needs a walker to treat with the following condition  Answer:  Lower extremity weakness   08/18/19 0741         Follow-up Information    Philmore Pali, NP Follow up in 1 week(s).   Specialty: Nurse Practitioner Contact information: [place] [address] [phone]        Jerline Pain, MD Follow up in 2 day(s).   Specialty: Cardiology Contact information: [id number] [place] [address] [phone]          No Known  Allergies  Consultations: Cardiology  Procedures/Studies: CT ABDOMEN PELVIS W CONTRAST  Result Date: 08/14/2019 CLINICAL DATA:  86 year old with right lower quadrant pain. EXAM: CT ABDOMEN AND PELVIS WITH CONTRAST TECHNIQUE: Multidetector CT imaging of the abdomen and pelvis was performed using the standard protocol following bolus administration of intravenous contrast. CONTRAST:  160mL OMNIPAQUE IOHEXOL 300 MG/ML  SOLN COMPARISON:  Abdominal CT 07/04/2013. Abdominal MRI 07/21/2013 FINDINGS: Lower chest: Small bilateral pleural effusions with adjacent atelectasis. Mild cardiomegaly. Coronary artery calcifications. No pericardial effusion. Hepatobiliary: Small hepatic cysts, stable from prior exam. Gallbladder physiologically distended, no calcified stone. No biliary dilatation. Pancreas: Mild parenchymal atrophy. No ductal dilatation or inflammation. 2.2 cm lesion in the pancreatic tail is isodense  to adjacent splenic parenchyma, likely splenule, unchanged from prior exam. Spleen: Normal in size without focal abnormality. Adrenals/Urinary Tract: Mild adrenal thickening without dominant adrenal nodule. No hydronephrosis or perinephric edema. Homogeneous renal enhancement with symmetric excretion on delayed phase imaging. Hyperdense left renal cyst measures 2.9 cm, slightly increased from 2.4 cm on 2014 exam. Additional small renal lesions are too small to accurately characterize. Calcification in the lower left kidney is likely parenchymal. Possible nonobstructing right renal stone. Urinary bladder is partially distended without wall thickening. Stomach/Bowel: Small hiatal hernia. Fluid-filled mildly dilated stomach without gastric wall thickening. Proximal small bowel are unremarkable. Moderate to long length of small bowel loops in the pelvis lower abdomen and right pelvis are fluid-filled and slightly prominent, with mild adjacent mesenteric edema and small volume free fluid. No obstruction. No terminal  ileal inflammation. No appendicitis. Diminutive appendix tentatively visualized curling adjacent to the cecum. Liquid stool in the cecum and ascending colon. No colonic wall thickening. Small volume of stool in the remainder the colon. Sigmoid colonic tortuosity. No significant diverticular disease. Vascular/Lymphatic: Infrarenal aortic aneurysm maximal dimension 3.7 cm. Diffuse calcified and noncalcified atheromatous plaque of the abdominal aorta and its branches. Proximal portal vein is patent. The central mesenteric vessels are patent. No enlarged lymph nodes in the abdomen or pelvis. Reproductive: Prominent prostate gland spanning 5.3 cm with central calcifications. Other: Small amount of free fluid in the right lower quadrant tracks into the pelvis. Small amount of right upper quadrant ascites adjacent to the liver. No organized abscess. No free air. Fat within both inguinal canals, left greater than right. Musculoskeletal: Multilevel degenerative change throughout the lumbar spine. Advanced right hip osteoarthritis. There are no acute or suspicious osseous abnormalities. IMPRESSION: 1. Mildly prominent fluid-filled small bowel loops in the pelvis and right pelvis with mild adjacent mesenteric edema and free fluid. Findings suspicious for enteritis, likely infectious or inflammatory. Possibility of ischemia is considered, however felt less likely given the length of bowel involvement and patency of mesenteric vessels. 2. Infrarenal aortic aneurysm maximal dimension 3.7 cm. Recommend followup by ultrasound in 2 years. This recommendation follows ACR consensus guidelines: White Paper of the ACR Incidental Findings Committee II on Vascular Findings. J Am Coll Radiol 2013; 10:789-794. Aortic aneurysm NOS (ICD10-I71.9) 3. Small bilateral pleural effusions with adjacent atelectasis. Mild cardiomegaly. 4. Additional chronic findings as described. Aortic Atherosclerosis (ICD10-I70.0). Electronically Signed   By:  Keith Rake M.D.   On: 08/14/2019 02:19   DG Chest Port 1 View  Result Date: 08/16/2019 CLINICAL DATA:  Respiratory distress EXAM: PORTABLE CHEST 1 VIEW COMPARISON:  10/30/2018 FINDINGS: Gross cardiomegaly. Mild, diffuse interstitial pulmonary opacity. Small, layering bilateral pleural effusions. The visualized skeletal structures are unremarkable. IMPRESSION: Cardiomegaly with mild, diffuse interstitial pulmonary opacity and small, layering bilateral pleural effusions, likely edema. Atypical/viral infection is a differential consideration. Electronically Signed   By: Eddie Candle M.D.   On: 08/16/2019 20:35   ECHOCARDIOGRAM COMPLETE  Result Date: 08/14/2019   ECHOCARDIOGRAM REPORT   Patient Name:   Clinton Holmes Date of Exam: 08/14/2019 Medical Rec #:  [id number]       Height:       72.0 in Accession #:    [id number]      Weight:       241.0 lb Date of Birth:  06-08-1928        BSA:          2.31 m Patient Age:    52 years  BP:           117/96 mmHg Patient Gender: M               HR:           100 bpm. Exam Location:  [facility] Procedure: 2D Echo, Color Doppler and Cardiac Doppler Indications:     I48.91 Atrial fibrillation  History:         Patient has prior history of Echocardiogram examinations. CAD,                  CKD; Risk Factors:Hypertension.  Sonographer:     Charmayne Sheer RDCS (AE) Referring Phys:  [id number] Arvella Merles MANSY Diagnosing Phys: Kate Sable MD IMPRESSIONS  1. Left ventricular ejection fraction, by visual estimation, is 25 to 30%. The left ventricle has severely decreased function. There is mildly increased left ventricular hypertrophy.  2. Left ventricular diastolic parameters are indeterminate.  3. The left ventricle demonstrates global hypokinesis.  4. Global right ventricle has mildly reduced systolic function.The right ventricular size is normal. No increase in right ventricular wall thickness.  5. Left atrial size was mildly dilated.  6. Right atrial size was normal.  7. The  mitral valve is grossly normal. Trivial mitral valve regurgitation.  8. The tricuspid valve is normal in structure.  9. Aortic valve area, by VTI measures 0.65 cm. 10. Aortic valve mean gradient measures 22.0 mmHg. 11. Aortic valve peak gradient measures 41.5 mmHg. 12. The aortic valve is abnormal. Aortic valve regurgitation is not visualized. Severe aortic valve stenosis. 13. The pulmonic valve was not well visualized. Pulmonic valve regurgitation is not visualized. 14. Moderately elevated pulmonary artery systolic pressure. 15. The inferior vena cava is dilated in size with <50% respiratory variability, suggesting right atrial pressure of 15 mmHg. FINDINGS  Left Ventricle: Left ventricular ejection fraction, by visual estimation, is 25 to 30%. The left ventricle has severely decreased function. The left ventricle demonstrates global hypokinesis. The left ventricular internal cavity size was the left ventricle is normal in size. There is mildly increased left ventricular hypertrophy. Left ventricular diastolic parameters are indeterminate. Right Ventricle: The right ventricular size is normal. No increase in right ventricular wall thickness. Global RV systolic function is has mildly reduced systolic function. The tricuspid regurgitant velocity is 2.82 m/s, and with an assumed right atrial pressure of 15 mmHg, the estimated right ventricular systolic pressure is moderately elevated at 46.8 mmHg. Left Atrium: Left atrial size was mildly dilated. Right Atrium: Right atrial size was normal in size Pericardium: There is no evidence of pericardial effusion. Mitral Valve: The mitral valve is grossly normal. Trivial mitral valve regurgitation. MV peak gradient, 5.7 mmHg. Tricuspid Valve: The tricuspid valve is normal in structure. Tricuspid valve regurgitation is not demonstrated. Aortic Valve: The aortic valve is abnormal. Aortic valve regurgitation is not visualized. Severe aortic stenosis is present. Aortic valve mean  gradient measures 22.0 mmHg. Aortic valve peak gradient measures 41.5 mmHg. Aortic valve area, by VTI measures 0.65 cm. Aortic valve DVI is 0.14. Low flow low gradient severe aortic stenosis. Pulmonic Valve: The pulmonic valve was not well visualized. Pulmonic valve regurgitation is not visualized. Pulmonic regurgitation is not visualized. Aorta: The aortic root is normal in size and structure. Venous: The inferior vena cava is dilated in size with less than 50% respiratory variability, suggesting right atrial pressure of 15 mmHg. IAS/Shunts: No atrial level shunt detected by color flow Doppler.  LEFT VENTRICLE PLAX 2D LVIDd:  5.91 cm  Diastology LVIDs:         4.48 cm  LV e' lateral:   12.90 cm/s LV PW:         1.17 cm  LV E/e' lateral: 7.3 LV IVS:        1.29 cm  LV e' medial:    6.53 cm/s LVOT diam:     2.30 cm  LV E/e' medial:  14.4 LV SV:         82 ml LV SV Index:   34.50 LVOT Area:     4.15 cm  RIGHT VENTRICLE RV Basal diam:  4.74 cm LEFT ATRIUM             Index       RIGHT ATRIUM           Index LA diam:        4.30 cm 1.86 cm/m  RA Area:     25.40 cm LA Vol (A2C):   89.6 ml 38.85 ml/m RA Volume:   72.40 ml  31.39 ml/m LA Vol (A4C):   87.1 ml 37.77 ml/m LA Biplane Vol: 90.5 ml 39.24 ml/m  AORTIC VALVE                    PULMONIC VALVE AV Area (Vmax):    0.82 cm     PV Vmax:       1.19 m/s AV Area (Vmean):   0.66 cm     PV Vmean:      79.800 cm/s AV Area (VTI):     0.65 cm     PV VTI:        0.213 m AV Vmax:           322.00 cm/s  PV Peak grad:  5.7 mmHg AV Vmean:          215.000 cm/s PV Mean grad:  3.0 mmHg AV VTI:            0.592 m AV Peak Grad:      41.5 mmHg AV Mean Grad:      22.0 mmHg LVOT Vmax:         63.50 cm/s LVOT Vmean:        34.000 cm/s LVOT VTI:          0.093 m LVOT/AV VTI ratio: 0.16  AORTA Ao Root diam: 3.50 cm MITRAL VALVE                       TRICUSPID VALVE MV Area (PHT): 4.83 cm            TR Peak grad:   31.8 mmHg MV Peak grad:  5.7 mmHg            TR Vmax:         287.00 cm/s MV Mean grad:  2.0 mmHg MV Vmax:       1.19 m/s            SHUNTS MV Vmean:      67.3 cm/s           Systemic VTI:  0.09 m MV VTI:        0.22 m              Systemic Diam: 2.30 cm MV PHT:        45.53 msec MV Decel Time: 157 msec MV E velocity: 93.80 cm/s 103 cm/s  Kate Sable MD Electronically signed by Aaron Edelman  Agbor-Etang MD Signature Date/Time: 08/14/2019/2:01:47 PM    Final    ECHO TEE  Result Date: 08/19/2019   TRANSESOPHOGEAL ECHO REPORT   Patient Name:   Clinton Holmes Date of Exam: 08/19/2019 Medical Rec #:  [id number]       Height:       72.0 in Accession #:    [id number]      Weight:       289.0 lb Date of Birth:  24-Jun-1928        BSA:          2.49 m Patient Age:    18 years        BP:           132/97 mmHg Patient Gender: M               HR:           114 bpm. Exam Location:  [facility]  Procedure: Color Doppler, Cardiac Doppler, Saline Contrast Bubble Study and            Transesophageal Echo Indications:     I48.91 Atrial fibrillation  History:         Patient has prior history of Echocardiogram examinations, most                  recent 08/14/2019.  Sonographer:     Charmayne Sheer RDCS (AE) Referring Phys:  [id number] Rise Mu Diagnosing Phys: Ida Rogue MD  PROCEDURE: Consent was requested emergently by emergency room physicain. Local oropharyngeal anesthetic was provided with Benzocaine spray and viscous lidocaine. The transesophogeal probe was passed through the esophogus of the patient. Image quality was  excellent. The patient's vital signs; including heart rate, blood pressure, and oxygen saturation; remained stable throughout the procedure. The patient developed no complications during the procedure. IMPRESSIONS  1. Left ventricular ejection fraction, by visual estimation, is 25 to 30%. The left ventricle has severely decreased function. There is no left ventricular hypertrophy.  2. The left ventricle demonstrates global hypokinesis.  3. Global right ventricle has moderately reduced  systolic function.The right ventricular size is mildly enlarged. No increase in right ventricular wall thickness.  4. Left atrial size was moderately dilated.  5. Right atrial size was moderately dilated.  6. Severe aortic valve stenosis.Successful cardioversion after TEE for persistent atrial fibrillation. NSR was restored. FINDINGS  Left Ventricle: Left ventricular ejection fraction, by visual estimation, is 25 to 30%. The left ventricle has severely decreased function. The left ventricle demonstrates global hypokinesis. There is no left ventricular hypertrophy. Normal left atrial pressure. Right Ventricle: The right ventricular size is mildly enlarged. No increase in right ventricular wall thickness. Global RV systolic function is has moderately reduced systolic function. Left Atrium: Left atrial size was moderately dilated. Right Atrium: Right atrial size was moderately dilated Pericardium: There is no evidence of pericardial effusion. Mitral Valve: The mitral valve is normal in structure. No evidence of mitral valve stenosis by observation. Mild to moderate mitral valve regurgitation. Tricuspid Valve: The tricuspid valve is normal in structure. Tricuspid valve regurgitation is mild. Aortic Valve: The aortic valve is normal in structure. Aortic valve regurgitation is not visualized. Severe aortic stenosis is present. Pulmonic Valve: The pulmonic valve was normal in structure. Pulmonic valve regurgitation is not visualized. Aorta: The aortic root, ascending aorta and aortic arch are all structurally normal, with no evidence of dilitation or obstruction. Venous: The inferior vena cava is normal in size with greater than 50% respiratory  variability, suggesting right atrial pressure of 3 mmHg. Shunts: Agitated saline contrast was given intravenously to evaluate for intracardiac shunting. Saline contrast bubble study was negative, with no evidence of any interatrial shunt. There is no evidence of a patent foramen  ovale. No ventricular septal defect is seen or detected. There is no evidence of an atrial septal defect. No atrial level shunt detected by color flow Doppler.  Ida Rogue MD Electronically signed by Ida Rogue MD Signature Date/Time: 08/19/2019/6:36:03 PM    Final        Subjective: Patient has no complaints.  He is off of oxygen on room air satting well above 92% and with ambulation he was 94%.  He reports his shortness of breath has improved.  Denies any orthopnea or chest pain.  Discharge Exam: Vitals:   08/22/19 2354 08/23/19 0726  BP: 108/75 136/85  Pulse: 73 63  Resp: 18   Temp: 97.7 F (36.5 C) 97.6 F (36.4 C)  SpO2: 95% 95%   Vitals:   08/22/19 2018 08/22/19 2354 08/23/19 0500 08/23/19 0726  BP: 123/89 108/75  136/85  Pulse: 68 73  63  Resp: 16 18    Temp: 98.3 F (36.8 C) 97.7 F (36.5 C)  97.6 F (36.4 C)  TempSrc: Oral Oral  Oral  SpO2: 97% 95%  95%  Weight:   104.7 kg   Height:        General: Comfortable, nontachypneic not in acute distress Cardiovascular: RRR, S1/S2 +, no rubs, no gallops Respiratory: CTA bilaterally, no wheezing, no rhonchi Abdominal: Soft, NT, ND, bowel sounds + Extremities: no edema, no cyanosis Neuro: AAx0x3, grossly intact    The results of significant diagnostics from this hospitalization (including imaging, microbiology, ancillary and laboratory) are listed below for reference.     Microbiology: Recent Results (from the past 240 hour(s))  C difficile quick scan w PCR reflex     Status: None   Collection Time: 08/14/19  2:07 PM   Specimen: STOOL  Result Value Ref Range Status   C Diff antigen NEGATIVE NEGATIVE Final   C Diff toxin NEGATIVE NEGATIVE Final   C Diff interpretation No C. difficile detected.  Final    Comment: Performed at [facility], [address] [place], [place] [address]  GI pathogen panel by PCR, stool     Status: None   Collection Time: 08/14/19  2:07 PM   Specimen: Stool  Result  Value Ref Range Status   Plesiomonas shigelloides NOT DETECTED NOT DETECTED Final   Yersinia enterocolitica NOT DETECTED NOT DETECTED Final   Vibrio NOT DETECTED NOT DETECTED Final   Enteropathogenic E coli NOT DETECTED NOT DETECTED Final   E coli (ETEC) LT/ST NOT DETECTED NOT DETECTED Final   E coli [id number] by PCR Not applicable NOT DETECTED Final   Cryptosporidium by PCR NOT DETECTED NOT DETECTED Final   Entamoeba histolytica NOT DETECTED NOT DETECTED Final   Adenovirus F 40/41 NOT DETECTED NOT DETECTED Final   Norovirus GI/GII NOT DETECTED NOT DETECTED Final   Sapovirus NOT DETECTED NOT DETECTED Final    Comment: (NOTE) Performed At: [facility] [place], [place] [id number] Rush Farmer MD [id number]    Vibrio cholerae NOT DETECTED NOT DETECTED Final   Campylobacter by PCR NOT DETECTED NOT DETECTED Final   Salmonella by PCR NOT DETECTED NOT DETECTED Final   E coli (STEC) NOT DETECTED NOT DETECTED Final   Enteroaggregative E coli NOT DETECTED NOT DETECTED Final  Shigella by PCR NOT DETECTED NOT DETECTED Final   Cyclospora cayetanensis NOT DETECTED NOT DETECTED Final   Astrovirus NOT DETECTED NOT DETECTED Final   G lamblia by PCR NOT DETECTED NOT DETECTED Final   Rotavirus A by PCR NOT DETECTED NOT DETECTED Final  SARS CORONAVIRUS 2 (TAT 6-24 HRS) Nasopharyngeal Nasopharyngeal Swab     Status: None   Collection Time: 08/16/19 11:27 PM   Specimen: Nasopharyngeal Swab  Result Value Ref Range Status   SARS Coronavirus 2 NEGATIVE NEGATIVE Final    Comment: (NOTE) SARS-CoV-2 target nucleic acids are NOT DETECTED. The SARS-CoV-2 RNA is generally detectable in upper and lower respiratory specimens during the acute phase of infection. Negative results do not preclude SARS-CoV-2 infection, do not rule out co-infections with other pathogens, and should not be used as the sole basis for treatment or other patient management decisions. Negative results must be  combined with clinical observations, patient history, and epidemiological information. The expected result is Negative. Fact Sheet for Patients: SugarRoll.be Fact Sheet for Healthcare Providers: https://www.woods-mathews.com/ This test is not yet approved or cleared by the [place] FDA and  has been authorized for detection and/or diagnosis of SARS-CoV-2 by FDA under an Emergency Use Authorization (EUA). This EUA will remain  in effect (meaning this test can be used) for the duration of the COVID-19 declaration under Section 56 4(b)(1) of the Act, 21 U.S.C. section 360bbb-3(b)(1), unless the authorization is terminated or revoked sooner. Performed at [facility], [place] [address] [place], [place] [address]      Labs: BNP (last 3 results) Recent Labs    08/16/19 2007  BNP [id number]*   Basic Metabolic Panel: Recent Labs  Lab 08/16/19 2008 08/18/19 [id number] 08/19/19 0755 08/20/19 0304 08/21/19 0212 08/22/19 0408 08/23/19 0625  NA 139 138 139 141 143  --  141  K 4.8 4.4 4.1 4.1 3.4*  --  4.2  CL 107 104 103 101 100  --  96*  CO2 19* 27 27 31  32  --  35*  GLUCOSE 188* 105* 94 99 100*  --  98  BUN 34* 39* 39* 37* 36*  --  27*  CREATININE 2.09* 2.00* 1.93* 1.87* 1.61* 1.69* 1.65*  CALCIUM 9.4 9.6 9.4 9.6 9.1  --  9.4  MG 2.4  --   --   --   --   --   --    Liver Function Tests: No results for input(s): AST, ALT, ALKPHOS, BILITOT, PROT, ALBUMIN in the last 168 hours. No results for input(s): LIPASE, AMYLASE in the last 168 hours. No results for input(s): AMMONIA in the last 168 hours. CBC: Recent Labs  Lab 08/16/19 2008 08/17/19 0753 08/20/19 0304 08/23/19 0625  WBC 11.5* 10.8* 9.2 10.9*  NEUTROABS  --  8.9*  --   --   HGB 13.6 13.5 12.8* 14.2  HCT 40.9 43.5 41.2 44.9  MCV 87.8 90.8 92.6 91.3  PLT 181 171 163 168   Cardiac Enzymes: No results for input(s): CKTOTAL, CKMB, CKMBINDEX, TROPONINI in the last 168  hours. BNP: Invalid input(s): POCBNP CBG: Recent Labs  Lab 08/16/19 2004  GLUCAP 161*   D-Dimer No results for input(s): DDIMER in the last 72 hours. Hgb A1c No results for input(s): HGBA1C in the last 72 hours. Lipid Profile No results for input(s): CHOL, HDL, LDLCALC, TRIG, CHOLHDL, LDLDIRECT in the last 72 hours. Thyroid function studies No results for input(s): TSH, T4TOTAL, T3FREE, THYROIDAB in the last 72 hours.  Invalid input(s): FREET3 Anemia work up No results for input(s): VITAMINB12, FOLATE, FERRITIN, TIBC, IRON, RETICCTPCT in the last 72 hours. Urinalysis    Component Value Date/Time   COLORURINE YELLOW (A) 08/14/2019 2320   APPEARANCEUR HAZY (A) 08/14/2019 2320   LABSPEC 1.039 (H) 08/14/2019 2320   PHURINE 5.0 08/14/2019 2320   GLUCOSEU NEGATIVE 08/14/2019 2320   HGBUR NEGATIVE 08/14/2019 2320   BILIRUBINUR NEGATIVE 08/14/2019 2320   KETONESUR NEGATIVE 08/14/2019 2320   PROTEINUR 30 (A) 08/14/2019 2320   UROBILINOGEN 1.0 07/04/2013 0541   NITRITE NEGATIVE 08/14/2019 2320   LEUKOCYTESUR TRACE (A) 08/14/2019 2320   Sepsis Labs Invalid input(s): PROCALCITONIN,  WBC,  LACTICIDVEN Microbiology Recent Results (from the past 240 hour(s))  C difficile quick scan w PCR reflex     Status: None   Collection Time: 08/14/19  2:07 PM   Specimen: STOOL  Result Value Ref Range Status   C Diff antigen NEGATIVE NEGATIVE Final   C Diff toxin NEGATIVE NEGATIVE Final   C Diff interpretation No C. difficile detected.  Final    Comment: Performed at [facility], [address] [place], [place] [address]  GI pathogen panel by PCR, stool     Status: None   Collection Time: 08/14/19  2:07 PM   Specimen: Stool  Result Value Ref Range Status   Plesiomonas shigelloides NOT DETECTED NOT DETECTED Final   Yersinia enterocolitica NOT DETECTED NOT DETECTED Final   Vibrio NOT DETECTED NOT DETECTED Final   Enteropathogenic E coli NOT DETECTED NOT DETECTED Final   E coli  (ETEC) LT/ST NOT DETECTED NOT DETECTED Final   E coli [id number] by PCR Not applicable NOT DETECTED Final   Cryptosporidium by PCR NOT DETECTED NOT DETECTED Final   Entamoeba histolytica NOT DETECTED NOT DETECTED Final   Adenovirus F 40/41 NOT DETECTED NOT DETECTED Final   Norovirus GI/GII NOT DETECTED NOT DETECTED Final   Sapovirus NOT DETECTED NOT DETECTED Final    Comment: (NOTE) Performed At: [facility] [place], [place] [id number] Rush Farmer MD [id number]    Vibrio cholerae NOT DETECTED NOT DETECTED Final   Campylobacter by PCR NOT DETECTED NOT DETECTED Final   Salmonella by PCR NOT DETECTED NOT DETECTED Final   E coli (STEC) NOT DETECTED NOT DETECTED Final   Enteroaggregative E coli NOT DETECTED NOT DETECTED Final   Shigella by PCR NOT DETECTED NOT DETECTED Final   Cyclospora cayetanensis NOT DETECTED NOT DETECTED Final   Astrovirus NOT DETECTED NOT DETECTED Final   G lamblia by PCR NOT DETECTED NOT DETECTED Final   Rotavirus A by PCR NOT DETECTED NOT DETECTED Final  SARS CORONAVIRUS 2 (TAT 6-24 HRS) Nasopharyngeal Nasopharyngeal Swab     Status: None   Collection Time: 08/16/19 11:27 PM   Specimen: Nasopharyngeal Swab  Result Value Ref Range Status   SARS Coronavirus 2 NEGATIVE NEGATIVE Final    Comment: (NOTE) SARS-CoV-2 target nucleic acids are NOT DETECTED. The SARS-CoV-2 RNA is generally detectable in upper and lower respiratory specimens during the acute phase of infection. Negative results do not preclude SARS-CoV-2 infection, do not rule out co-infections with other pathogens, and should not be used as the sole basis for treatment or other patient management decisions. Negative results must be combined with clinical observations, patient history, and epidemiological information. The expected result is Negative. Fact Sheet for Patients: SugarRoll.be Fact Sheet for Healthcare  Providers: https://www.woods-mathews.com/ This test is not yet approved or cleared by the [place]  FDA and  has been authorized for detection and/or diagnosis of SARS-CoV-2 by FDA under an Emergency Use Authorization (EUA). This EUA will remain  in effect (meaning this test can be used) for the duration of the COVID-19 declaration under Section 56 4(b)(1) of the Act, 21 U.S.C. section 360bbb-3(b)(1), unless the authorization is terminated or revoked sooner. Performed at [facility], [place] [address], [place], [place] [address]    Acute enteritis with associated abdominal pain and vomiting, differential infectious, inflammatory, less likely ischemic based on patency of mesenteric vessels. Afebrile Stool studies negative Resolved  Atrial fibrillation with rapid ventricular response:  S/p TEE CV On Eliquis with CHA2DS2-VASc7. Continue Toprol-XL 100 mg twice daily     Reported blood with bowel movement.No new episode so far here  - patient reports for some time now he has had a little bit of blood with hard bowel movements. He is not aware of hemorrhoids in the past. He has not seen a GI doctor. --External exam revealed external hemorrhoids. NO significant bleeding.    Gait instability at home, uses a walker, no falls but reports unsteadiness. --PT evaluated recommended home health with PT and aide  Face to face completed  Chronic combined systolic, diastolic CHF secondary to NICM followed by cardiology [facility], sec to HTN heart disease, aortic stenosis and PVCs.-Remains stable/euvolemic - echocardiogram this admission LVEF 25-30%, global hypokinesis. Ventricular diastolic parameters left ventricle indeterminate. Mildly reduced RV function systolic.  Euvolemic now. Cards rec lasix 40mg  bid po with f/u with cards next week acei on hold   Severe aortic stenosis seen on echocardiogram 08/14/2019 and 07/25/2018. Plan for TAVR as  outpt   CAD/Elevated troponin-, last cath 2016 widely patent coronary arteries, mild global left ventricular hypokinesis Likely 2/2 demand ischemia in the setting of acute enteritis and acute on chronic kidney disease and A. fib RVR cardiology following.  This is not consistent with ACS.  AKI superimposed on CKD stage IIIb --Improved and appears to be at baseline now.  Abdominal aortic aneurysm 3.7 cm --Outpatient ultrasound in 2 years  PMH stroke w/ left eye impact --Continue statin. Plavix stopped as the patient is now on anticoagulation.  Aortic atherosclerosis, asymptomatic --Continue statin  6 mm right lower lobe nodular opacity seen on chest CT 10/2018 during chart review. Recommend outpatient follow-up with consideration given to CAT scan in the next 3 months, by March.    Time coordinating discharge: Over 30 minutes  SIGNED:   Nolberto Hanlon, MD  [facility] 08/23/2019, 11:28 AM Pager   If 7PM-7AM, please contact night-coverage [email or web address] Password [email or web address]

## 2019-08-23 NOTE — Progress Notes (Signed)
Phone call from patient's bedside nurse stating that  patient chose not to take concentrator home at discharge. Phone call to [facility] from [employer] to pick up concentrator tomorrow.   [place], [place] Social Work [phone]

## 2019-08-24 ENCOUNTER — Telehealth: Payer: Self-pay | Admitting: Cardiology

## 2019-08-24 ENCOUNTER — Telehealth: Payer: Self-pay | Admitting: *Deleted

## 2019-08-24 NOTE — Telephone Encounter (Signed)
Patient contacted regarding discharge from [facility] on 08/23/2019.  Patient understands to follow up with provider Truitt Merle, NP on Wednesday 08/26/2019 at 10:00 am at the [facility] office. Patient understands discharge instructions? Yes Patient understands medications and regiment? Yes Patient understands to bring all medications to this visit? Yes  I advised the patient that I will also be sending a message to Theodosia Quay, RN for the TAVR clinic as well to advise her of the patient's need for a consult. The patient is aware Ander Purpura may possibly reach out to him prior to his appointment with Cecille Rubin on Wednesday.  The patient voices understanding and is agreeable.

## 2019-08-24 NOTE — Telephone Encounter (Signed)
S/w pt's daughter per [facility]) daughter will accompany pt to ov.  Comments added to appt notes.

## 2019-08-24 NOTE — Telephone Encounter (Signed)
-----   Message from Minna Merritts, MD sent at 08/23/2019  1:24 PM EST ----- Needs TCM phone call  Also needs close follow-up in our clinic  Can we also place CT surgery consult for TAVR ASAP Severe stenosis of aortic valve  Thx TGollan

## 2019-08-24 NOTE — Telephone Encounter (Signed)
Clinton Holmes, Saughter of the patient would like to be present with her Dad during his hospital f/u Wednesday. The patient is hard of hearing. The Doctors at the hospital state that the pt will need surgery to have his heart valve replaced. The daughter wants to be there to make sure she does not miss any important information. She will be the one to drive him to the appointment.

## 2019-08-24 NOTE — Progress Notes (Signed)
CARDIOLOGY OFFICE NOTE  Date:  08/26/2019    Clinton Holmes Date of Birth: 14-Oct-1927 Medical Record [id number]  PCP:  Leonides Sake, MD  Cardiologist:  Skains/Arida/Klein   Chief Complaint  Patient presents with  . Hospitalization Follow-up    Follow up - seen for Dr. Larina Bras    History of Present Illness: Clinton Holmes is a 86 y.o. male who presents today for a post hospital/TOC visit. He has seen Dr. Shirl Harris & Caryl Comes. He has been in the [place] office as well.  Noted that he was going to transfer his care to the [place] office.   He has a known history of CAD, CKD, HTN and osteoporosis. Echo with EF of 45 to 50% in December of 2019 noted.   Hospitalized earlier this month at [facility] with abdominal pain and vomiting. EKG with AF with RVR and anteroseptal/inferior Q waves. CT scan was remarkable for enteritis likely infectious.  Also showed infrarenal aortic aneurysm with diameter of 3.7 cm with recommendation for ultrasound in 2 years. Echo showed EF down to 25 to 30% with severe AS. Cardiology was consulted. He underwent TEE cardioversion. He was started on Eliquis. He was to have TAVR arranged as outpatient. Noted to have elevated troponin but had cath from 2016 showing widely patent coronary arteries. This was felt to be demand ischemia and not ACS.   The patient does not have symptoms concerning for COVID-19 infection (fever, chills, cough, or new shortness of breath).   Comes in today. Here with his daughter. He is in a wheelchair. They do live in [place]. Deaf in one ear. 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 He was living alone up until this last admission and has required more assistance with ADL's. He does not drive. No chest pain. Says his breathing is ok. No passing out. No palpitations. He is on aspirin and Eliquis. She is hopeful that we will be able to stop some medicines at some point.     Past Medical History:  Diagnosis Date  . CAD (coronary artery disease)    L&RHC 12/23/2014 elevated LVEDP, prox LAD 45%, 60% ramus  . CKD (chronic kidney disease), stage III   . Essential hypertension   . Murmur   . OA (osteoarthritis)    a. 2011 s/p bilat TKA.  . Ventricular bigeminy    a. 12/2014. Started on amio on 12/24/2014    Past Surgical History:  Procedure Laterality Date  . CARDIAC CATHETERIZATION N/A 12/23/2014   Procedure: Right/Left Heart Cath and Coronary Angiography;  Surgeon: Belva Crome, MD;  Location: [facility];  Service: Cardiovascular;  Laterality: N/A;  . CARDIOVERSION N/A 08/19/2019   Procedure: CARDIOVERSION;  Surgeon: Minna Merritts, MD;  Location: [facility];  Service: Cardiovascular;  Laterality: N/A;  . KNEE SURGERY    . SHOULDER SURGERY Left   . TEE WITHOUT CARDIOVERSION N/A 08/19/2019   Procedure: TRANSESOPHAGEAL ECHOCARDIOGRAM (TEE);  Surgeon: Minna Merritts, MD;  Location: [facility] ORS;  Service: Cardiovascular;  Laterality: N/A;  . TONSILLECTOMY       Medications: Current Meds  Medication Sig  . apixaban (ELIQUIS) 2.5 MG TABS tablet Take 1 tablet (2.5 mg total) by mouth 2 (two) times daily.  Marland Kitchen aspirin EC 81 MG EC tablet Take 1 tablet (81 mg total) by mouth daily.  . furosemide (LASIX) 40 MG tablet Take 1 tablet (40 mg total) by mouth  2 (two) times daily.  . metoprolol succinate (TOPROL-XL) 50 MG 24 hr tablet Take 1 tablet (50 mg total) by mouth 2 (two) times daily. Take with or immediately following a meal.  . Multiple Vitamins-Minerals (MULTIVITAMIN ADULTS 50+ PO) Take 1 tablet by mouth daily.     Allergies: No Known Allergies  Social History: The patient  reports that he has quit smoking. He has never used smokeless tobacco. He reports that he does not drink alcohol or use drugs.   Family History: The patient's family history includes CAD in his mother; Congestive Heart Failure in his sister; Heart attack in his father; Other in  his brother.   Review of Systems: Please see the history of present illness.   All other systems are reviewed and negative.   Physical Exam: VS:  BP 110/80   Pulse 65   Ht 6' (1.829 m)   Wt 229 lb (103.9 kg)   BMI 31.06 kg/m  .  BMI Body mass index is 31.06 kg/m.  Wt Readings from Last 3 Encounters:  08/26/19 229 lb (103.9 kg)  08/23/19 230 lb 13.2 oz (104.7 kg)  10/30/18 242 lb 8.1 oz (110 kg)    General: Pleasant. Looks chronically ill. Alert and in no acute distress. His weight was 244 when here in March.   HEENT: Normal. He is hard of hearing.  Neck: Supple, no JVD, carotid bruits, or masses noted.  Cardiac: Regular rate and rhythm. Outflow murmur noted - heart tones are distant. No significant edema.  Respiratory:  Lungs are clear to auscultation bilaterally with normal work of breathing.  GI: Soft and nontender.  MS: No deformity or atrophy. Gait not tested.  Skin: Warm and dry. Color is sallow.  Neuro:  Strength and sensation are intact and no gross focal deficits noted.  Psych: Alert, appropriate and with normal affect.   LABORATORY DATA:  EKG:  EKG is ordered today. This demonstrates sinus rhythm with LBBB - HR is 65 today.  Lab Results  Component Value Date   WBC 10.9 (H) 08/23/2019   HGB 14.2 08/23/2019   HCT 44.9 08/23/2019   PLT 168 08/23/2019   GLUCOSE 98 08/23/2019   CHOL 148 07/24/2018   TRIG 41 07/24/2018   HDL 37 (L) 07/24/2018   LDLCALC 103 (H) 07/24/2018   ALT 20 08/13/2019   AST 30 08/13/2019   NA 141 08/23/2019   K 4.2 08/23/2019   CL 96 (L) 08/23/2019   CREATININE 1.65 (H) 08/23/2019   BUN 27 (H) 08/23/2019   CO2 35 (H) 08/23/2019   TSH 0.802 08/15/2019   INR 1.6 (H) 08/19/2019       BNP (last 3 results) Recent Labs    08/16/19 2007  BNP 2,308.0*    ProBNP (last 3 results) No results for input(s): PROBNP in the last 8760 hours.   Other Studies Reviewed Today:  TEE/CARDIOVERSION IMPRESSIONS 08/19/2019   1. Left  ventricular ejection fraction, by visual estimation, is 25 to 30%. The left ventricle has severely decreased function. There is no left ventricular hypertrophy.  2. The left ventricle demonstrates global hypokinesis.  3. Global right ventricle has moderately reduced systolic function.The right ventricular size is mildly enlarged. No increase in right ventricular wall thickness.  4. Left atrial size was moderately dilated.  5. Right atrial size was moderately dilated.  6. Severe aortic valve stenosis.Successful cardioversion after TEE for persistent atrial fibrillation. NSR was restored.   TTE IMPRESSIONS 08/2019   1. Left ventricular ejection  fraction, by visual estimation, is 25 to 30%. The left ventricle has severely decreased function. There is mildly increased left ventricular hypertrophy.  2. Left ventricular diastolic parameters are indeterminate.  3. The left ventricle demonstrates global hypokinesis.  4. Global right ventricle has mildly reduced systolic function.The right ventricular size is normal. No increase in right ventricular wall thickness.  5. Left atrial size was mildly dilated.  6. Right atrial size was normal.  7. The mitral valve is grossly normal. Trivial mitral valve regurgitation.  8. The tricuspid valve is normal in structure.  9. Aortic valve area, by VTI measures 0.65 cm. 10. Aortic valve mean gradient measures 22.0 mmHg. 11. Aortic valve peak gradient measures 41.5 mmHg. 12. The aortic valve is abnormal. Aortic valve regurgitation is not visualized. Severe aortic valve stenosis. 13. The pulmonic valve was not well visualized. Pulmonic valve regurgitation is not visualized. 14. Moderately elevated pulmonary artery systolic pressure. 15. The inferior vena cava is dilated in size with <50% respiratory variability, suggesting right atrial pressure of 15 mmHg.    Right/Left Heart Cath and Coronary Angiography 2016  Conclusion   Widely patent coronary arteries  with 45% proximal LAD narrowing. The ramus intermedius branches and the more medial/anterior branch contains a 60% stenosis. No hemodynamically significant coronary lesions were identified.  No evidence of aortic stenosis  Mild pulmonary hypertension  Apparent mild global left ventricular hypokinesis although left ventriculography was of poor quality due to faint opacification.   RECOMMENDATIONS:   No specific cardiac interventions are necessary based upon the coronary anatomy or aortic valve.    Assessment/Plan:  1. Recent admission for enteritis - this seems resolved.   2. AF with RVR - s/p TEE cardioversion - remains in NSR by exam and EKG. Remains on Elquis.   3. Anticoagulation - recheck lab - no problems noted so far.   4. Severe AS - explained that we would start the TAVR process and this was explained - need for consult with Dr. Angelena Form, further testing, seeing TCTS, etc. He does appear to be a very poor candidate for conventional surgery.   5. Non obstructive CAD - last cath in 2016 - on aspirin - no active chest pain.   6. Chronic systolic HF - now with EF down to 25% - 30% - only on beta blocker and diuretic - no ARB due to CKD - rechecking lab today - weight is down - symptoms have improved.   80. Advanced age - was living alone but needs considerable assistance with ADLs. Family seems very supportive.   7. COVID-19 Education: The signs and symptoms of COVID-19 were discussed with the patient and how to seek care for testing (follow up with PCP or arrange E-visit).  The importance of social distancing, staying at home, hand hygiene and wearing a mask when out in public were discussed today.  Current medicines are reviewed with the patient today.  The patient does not have concerns regarding medicines other than what has been noted above.  The following changes have been made:  See above.  Labs/ tests ordered today include:    Orders Placed This Encounter   Procedures  . Basic metabolic panel  . CBC no Diff  . EKG 12-Lead     Disposition:   FU with Dr. Angelena Form next week as planned. Eventually they do wish to go back to the [place] office due to location.    Patient is agreeable to this plan and will call if any problems develop  in the interim.   SignedTruitt Merle, NP  08/26/2019 10:37 AM  [facility] [address] [place], [place]  [address] Phone: [phone] Fax: [phone]

## 2019-08-26 ENCOUNTER — Ambulatory Visit (INDEPENDENT_AMBULATORY_CARE_PROVIDER_SITE_OTHER): Payer: Medicare Other | Admitting: Nurse Practitioner

## 2019-08-26 ENCOUNTER — Encounter: Payer: Self-pay | Admitting: Nurse Practitioner

## 2019-08-26 ENCOUNTER — Other Ambulatory Visit: Payer: Self-pay

## 2019-08-26 VITALS — BP 110/80 | HR 65 | Ht 72.0 in | Wt 229.0 lb

## 2019-08-26 DIAGNOSIS — I4891 Unspecified atrial fibrillation: Secondary | ICD-10-CM

## 2019-08-26 DIAGNOSIS — I2510 Atherosclerotic heart disease of native coronary artery without angina pectoris: Secondary | ICD-10-CM

## 2019-08-26 DIAGNOSIS — N183 Chronic kidney disease, stage 3 unspecified: Secondary | ICD-10-CM

## 2019-08-26 DIAGNOSIS — Z09 Encounter for follow-up examination after completed treatment for conditions other than malignant neoplasm: Secondary | ICD-10-CM

## 2019-08-26 DIAGNOSIS — N182 Chronic kidney disease, stage 2 (mild): Secondary | ICD-10-CM

## 2019-08-26 DIAGNOSIS — I350 Nonrheumatic aortic (valve) stenosis: Secondary | ICD-10-CM

## 2019-08-26 DIAGNOSIS — I130 Hypertensive heart and chronic kidney disease with heart failure and stage 1 through stage 4 chronic kidney disease, or unspecified chronic kidney disease: Secondary | ICD-10-CM | POA: Diagnosis not present

## 2019-08-26 DIAGNOSIS — Z7189 Other specified counseling: Secondary | ICD-10-CM

## 2019-08-26 DIAGNOSIS — I5022 Chronic systolic (congestive) heart failure: Secondary | ICD-10-CM

## 2019-08-26 DIAGNOSIS — I480 Paroxysmal atrial fibrillation: Secondary | ICD-10-CM

## 2019-08-26 DIAGNOSIS — I482 Chronic atrial fibrillation, unspecified: Secondary | ICD-10-CM | POA: Diagnosis not present

## 2019-08-26 DIAGNOSIS — I5042 Chronic combined systolic (congestive) and diastolic (congestive) heart failure: Secondary | ICD-10-CM

## 2019-08-26 DIAGNOSIS — I10 Essential (primary) hypertension: Secondary | ICD-10-CM

## 2019-08-26 LAB — BASIC METABOLIC PANEL
BUN/Creatinine Ratio: 16 (ref 10–24)
BUN: 28 mg/dL — ABNORMAL HIGH (ref 8–27)
CO2: 30 mmol/L — ABNORMAL HIGH (ref 20–29)
Calcium: 9.7 mg/dL (ref 8.6–10.2)
Chloride: 92 mmol/L — ABNORMAL LOW (ref 96–106)
Creatinine, Ser: 1.74 mg/dL — ABNORMAL HIGH (ref 0.76–1.27)
GFR calc Af Amer: 39 mL/min/{1.73_m2} — ABNORMAL LOW (ref >59)
GFR calc non Af Amer: 34 mL/min/{1.73_m2} — ABNORMAL LOW (ref >59)
Glucose: 81 mg/dL (ref 65–99)
Potassium: 3.9 mmol/L (ref 3.5–5.2)
Sodium: 136 mmol/L (ref 134–144)

## 2019-08-26 LAB — CBC
Hematocrit: 44 % (ref 37.5–51.0)
Hemoglobin: 14.6 g/dL (ref 13.0–17.7)
MCH: 29.3 pg (ref 26.6–33.0)
MCHC: 33.2 g/dL (ref 31.5–35.7)
MCV: 88 fL (ref 79–97)
Platelets: 195 10*3/uL (ref 150–450)
RBC: 4.98 x10E6/uL (ref 4.14–5.80)
RDW: 13.9 % (ref 11.6–15.4)
WBC: 8.8 10*3/uL (ref 3.4–10.8)

## 2019-08-26 MED ORDER — FUROSEMIDE 40 MG PO TABS: 40.0000 mg | ORAL_TABLET | Freq: Two times a day (BID) | ORAL | 6 refills | Status: DC

## 2019-08-26 MED ORDER — METOPROLOL SUCCINATE ER 50 MG PO TB24: 50.0000 mg | ORAL_TABLET | Freq: Two times a day (BID) | ORAL | 6 refills | Status: AC

## 2019-08-26 MED ORDER — APIXABAN 2.5 MG PO TABS: 2.5000 mg | ORAL_TABLET | Freq: Two times a day (BID) | ORAL | 6 refills | Status: DC

## 2019-08-26 NOTE — Patient Instructions (Addendum)
After Visit Summary:  We will be checking the following labs today - BMET and CBC   Medication Instructions:    Continue with your current medicines.    If you need a refill on your cardiac medications before your next appointment, please call your pharmacy.     Testing/Procedures To Be Arranged:  N/A  Follow-Up:   See Dr. Angelena Form next week - this will be for a TAVR discussion - see information provided today - this will be the topic of discussion.     At [facility], you and your health needs are our priority.  As part of our continuing mission to provide you with exceptional heart care, we have created designated Provider Care Teams.  These Care Teams include your primary Cardiologist (physician) and Advanced Practice Providers (APPs -  Physician Assistants and Nurse Practitioners) who all work together to provide you with the care you need, when you need it.  Special Instructions:  . Stay safe, stay home, wash your hands for at least 20 seconds and wear a mask when out in public.  . It was good to talk with you both today.    Call the [place] office at [phone] if you have any questions, problems or concerns.

## 2019-08-27 ENCOUNTER — Other Ambulatory Visit: Payer: Self-pay | Admitting: *Deleted

## 2019-08-27 MED ORDER — FUROSEMIDE 40 MG PO TABS: 40.0000 mg | ORAL_TABLET | Freq: Every day | ORAL | 6 refills | Status: DC

## 2019-08-28 ENCOUNTER — Telehealth: Payer: Self-pay | Admitting: Cardiovascular Disease

## 2019-08-28 DIAGNOSIS — I2510 Atherosclerotic heart disease of native coronary artery without angina pectoris: Secondary | ICD-10-CM | POA: Diagnosis not present

## 2019-08-28 DIAGNOSIS — I5023 Acute on chronic systolic (congestive) heart failure: Secondary | ICD-10-CM | POA: Diagnosis not present

## 2019-08-28 DIAGNOSIS — M1990 Unspecified osteoarthritis, unspecified site: Secondary | ICD-10-CM | POA: Diagnosis not present

## 2019-08-28 DIAGNOSIS — N170 Acute kidney failure with tubular necrosis: Secondary | ICD-10-CM | POA: Diagnosis not present

## 2019-08-28 DIAGNOSIS — Z7901 Long term (current) use of anticoagulants: Secondary | ICD-10-CM | POA: Diagnosis not present

## 2019-08-28 DIAGNOSIS — I4891 Unspecified atrial fibrillation: Secondary | ICD-10-CM | POA: Diagnosis not present

## 2019-08-28 DIAGNOSIS — Z7982 Long term (current) use of aspirin: Secondary | ICD-10-CM | POA: Diagnosis not present

## 2019-08-28 DIAGNOSIS — I5020 Unspecified systolic (congestive) heart failure: Secondary | ICD-10-CM | POA: Diagnosis not present

## 2019-08-28 DIAGNOSIS — I0981 Rheumatic heart failure: Secondary | ICD-10-CM | POA: Diagnosis not present

## 2019-08-28 DIAGNOSIS — N1832 Chronic kidney disease, stage 3b: Secondary | ICD-10-CM | POA: Diagnosis not present

## 2019-08-28 DIAGNOSIS — Z87891 Personal history of nicotine dependence: Secondary | ICD-10-CM | POA: Diagnosis not present

## 2019-08-28 DIAGNOSIS — K529 Noninfective gastroenteritis and colitis, unspecified: Secondary | ICD-10-CM | POA: Diagnosis not present

## 2019-08-28 DIAGNOSIS — I080 Rheumatic disorders of both mitral and aortic valves: Secondary | ICD-10-CM | POA: Diagnosis not present

## 2019-08-28 DIAGNOSIS — I130 Hypertensive heart and chronic kidney disease with heart failure and stage 1 through stage 4 chronic kidney disease, or unspecified chronic kidney disease: Secondary | ICD-10-CM | POA: Diagnosis not present

## 2019-08-28 DIAGNOSIS — I714 Abdominal aortic aneurysm, without rupture: Secondary | ICD-10-CM | POA: Diagnosis not present

## 2019-08-28 DIAGNOSIS — M810 Age-related osteoporosis without current pathological fracture: Secondary | ICD-10-CM | POA: Diagnosis not present

## 2019-08-28 NOTE — Telephone Encounter (Signed)
Rqst for home health orders fwd to Dr. Rockey Situ for approval.

## 2019-08-28 NOTE — Telephone Encounter (Signed)
Per Home health orders needed for   Nursing once a week x 9 weeks

## 2019-08-30 NOTE — Telephone Encounter (Signed)
Can we find out who he is going to follow up with . Appears he was just seen in [place] post hospitalization at [facility] Can we talk with daughter. I can sign orders but he if he is not following up in [place], perhaps his primary cardiac doc  in [place] can sign

## 2019-08-31 DIAGNOSIS — I0981 Rheumatic heart failure: Secondary | ICD-10-CM | POA: Diagnosis not present

## 2019-08-31 DIAGNOSIS — I2510 Atherosclerotic heart disease of native coronary artery without angina pectoris: Secondary | ICD-10-CM | POA: Diagnosis not present

## 2019-08-31 DIAGNOSIS — N170 Acute kidney failure with tubular necrosis: Secondary | ICD-10-CM | POA: Diagnosis not present

## 2019-08-31 DIAGNOSIS — N1832 Chronic kidney disease, stage 3b: Secondary | ICD-10-CM | POA: Diagnosis not present

## 2019-08-31 DIAGNOSIS — I130 Hypertensive heart and chronic kidney disease with heart failure and stage 1 through stage 4 chronic kidney disease, or unspecified chronic kidney disease: Secondary | ICD-10-CM | POA: Diagnosis not present

## 2019-08-31 DIAGNOSIS — I5023 Acute on chronic systolic (congestive) heart failure: Secondary | ICD-10-CM | POA: Diagnosis not present

## 2019-08-31 NOTE — Telephone Encounter (Signed)
Clinton Holmes w/ [facility] order signed for 2x/4 weeks for PT Transferring to nurse

## 2019-08-31 NOTE — Telephone Encounter (Signed)
Spoke with [facility] and reviewed that patient has appointment on 09/02/19 with his primary cardiologist and they will be able to manage any orders needed for his care. He verbalized understanding with no further questions at this time.

## 2019-09-01 DIAGNOSIS — N189 Chronic kidney disease, unspecified: Secondary | ICD-10-CM | POA: Diagnosis not present

## 2019-09-01 DIAGNOSIS — Z79899 Other long term (current) drug therapy: Secondary | ICD-10-CM | POA: Diagnosis not present

## 2019-09-01 DIAGNOSIS — N179 Acute kidney failure, unspecified: Secondary | ICD-10-CM | POA: Diagnosis not present

## 2019-09-01 DIAGNOSIS — Z7901 Long term (current) use of anticoagulants: Secondary | ICD-10-CM | POA: Diagnosis not present

## 2019-09-01 DIAGNOSIS — I4891 Unspecified atrial fibrillation: Secondary | ICD-10-CM | POA: Diagnosis not present

## 2019-09-02 ENCOUNTER — Encounter: Payer: Self-pay | Admitting: *Deleted

## 2019-09-02 ENCOUNTER — Ambulatory Visit (INDEPENDENT_AMBULATORY_CARE_PROVIDER_SITE_OTHER): Payer: Medicare Other | Admitting: Cardiovascular Disease

## 2019-09-02 ENCOUNTER — Telehealth: Payer: Self-pay | Admitting: Cardiology

## 2019-09-02 ENCOUNTER — Other Ambulatory Visit: Payer: Self-pay

## 2019-09-02 ENCOUNTER — Encounter: Payer: Self-pay | Admitting: Cardiovascular Disease

## 2019-09-02 VITALS — BP 122/78 | HR 59 | Ht 72.0 in | Wt 229.0 lb

## 2019-09-02 DIAGNOSIS — I0981 Rheumatic heart failure: Secondary | ICD-10-CM | POA: Diagnosis not present

## 2019-09-02 DIAGNOSIS — I130 Hypertensive heart and chronic kidney disease with heart failure and stage 1 through stage 4 chronic kidney disease, or unspecified chronic kidney disease: Secondary | ICD-10-CM | POA: Diagnosis not present

## 2019-09-02 DIAGNOSIS — I350 Nonrheumatic aortic (valve) stenosis: Secondary | ICD-10-CM

## 2019-09-02 DIAGNOSIS — I2510 Atherosclerotic heart disease of native coronary artery without angina pectoris: Secondary | ICD-10-CM

## 2019-09-02 DIAGNOSIS — N1832 Chronic kidney disease, stage 3b: Secondary | ICD-10-CM | POA: Diagnosis not present

## 2019-09-02 DIAGNOSIS — I480 Paroxysmal atrial fibrillation: Secondary | ICD-10-CM

## 2019-09-02 DIAGNOSIS — Z01812 Encounter for preprocedural laboratory examination: Secondary | ICD-10-CM | POA: Diagnosis not present

## 2019-09-02 DIAGNOSIS — N170 Acute kidney failure with tubular necrosis: Secondary | ICD-10-CM | POA: Diagnosis not present

## 2019-09-02 DIAGNOSIS — I5023 Acute on chronic systolic (congestive) heart failure: Secondary | ICD-10-CM | POA: Diagnosis not present

## 2019-09-02 NOTE — Telephone Encounter (Signed)
Pt was seen today by Dr. Angelena Form for structural heart consult. I will send note to both primary card nurse Rexene Agent RN for Dr. Marlou Porch as well as Brooke Dare. RN for Dr. Angelena Form.

## 2019-09-02 NOTE — Telephone Encounter (Signed)
Clinton Holmes, Home Health PT was calling to get orders so that the patient can start doing in home PT. Please contact [facility] to send orders

## 2019-09-02 NOTE — H&P (View-Only) (Signed)
Structural Heart Clinic Consult Note  Chief Complaint  Patient presents with  . New Patient (Initial Visit)    severe aortic stenosis    History of Present Illness: 86 yo male with history of CAD, atrial fibrillation, stage 3 CKD, HTN, PVCs, ischemic cardiomyopathy and severe aortic stenosis who is here today as a new consult in the structural heart clinic, referred by Dr. Marlou Porch, for discussion regarding his aortic stenosis and possible TAVR. He was admitted to [facility] January 2021 with atrial fib with RVR, nausea and vomiting. Echo January 2021 with LVEF=25-30%. There was severe aortic stenosis. The valve leaflets are thickened and calcified. Mean gradient 22 mmHg, peak gradient 41 mmHg, AVA 0.65 cm2. Dimensionless index 0.14. He is felt to have low flow, low gradient severe AS. He underwent TEE guided cardioversion and was started on Eliquis. Cardiac cath in 2016 with moderate CAD (45% proximal LAD stenosis, 60% intermediate branch stenosis).   He tells me today that he feels well overall. He has no dyspnea, chest pain or dizziness. He does have fatigue. He lives alone. He is here today with his daughter. He does go to the dentist regularly and has no active dental issues. He is a retired Psychologist, sport and exercise.   Primary Care Physician: Leonides Sake, MD Primary Cardiologist: Laurey Arrow) Referring Cardiologist: Marlou Porch  Past Medical History:  Diagnosis Date  . CAD (coronary artery disease)    L&RHC 12/23/2014 elevated LVEDP, prox LAD 45%, 60% ramus  . CKD (chronic kidney disease), stage III   . Essential hypertension   . Murmur   . OA (osteoarthritis)    a. 2011 s/p bilat TKA.  . Ventricular bigeminy    a. 12/2014. Started on amio on 12/24/2014    Past Surgical History:  Procedure Laterality Date  . CARDIAC CATHETERIZATION N/A 12/23/2014   Procedure: Right/Left Heart Cath and Coronary Angiography;  Surgeon: Belva Crome, MD;  Location: [place] [facility];  Service: Cardiovascular;   Laterality: N/A;  . CARDIOVERSION N/A 08/19/2019   Procedure: CARDIOVERSION;  Surgeon: Minna Merritts, MD;  Location: [facility] OR[facility];  Service: Cardiovascular;  Laterality: N/A;  . KNEE SURGERY    . SHOULDER SURGERY Left   . TEE WITHOUT CARDIOVERSION N/A 08/19/2019   Procedure: TRANSESOPHAGEAL ECHOCARDIOGRAM (TEE);  Surgeon: Minna Merritts, MD;  Location: [facility] ORS;  Service: Cardiovascular;  Laterality: N/A;  . TONSILLECTOMY      Current Outpatient Medications  Medication Sig Dispense Refill  . apixaban (ELIQUIS) 2.5 MG TABS tablet Take 1 tablet (2.5 mg total) by mouth 2 (two) times daily. 60 tablet 6  . aspirin EC 81 MG EC tablet Take 1 tablet (81 mg total) by mouth daily.    . furosemide (LASIX) 40 MG tablet Take 1 tablet (40 mg total) by mouth daily. 60 tablet 6  . metoprolol succinate (TOPROL-XL) 50 MG 24 hr tablet Take 1 tablet (50 mg total) by mouth 2 (two) times daily. Take with or immediately following a meal. 60 tablet 6  . Multiple Vitamins-Minerals (MULTIVITAMIN ADULTS 50+ PO) Take 1 tablet by mouth daily.     No current facility-administered medications for this visit.    No Known Allergies  Social History   Socioeconomic History  . Marital status: Divorced    Spouse name: Not on file  . Number of children: 5  . Years of education: Not on file  . Highest education level: Not on file  Occupational History  . Occupation: Retired-Farmer  Tobacco Use  .  Smoking status: Former Research scientist (life sciences)  . Smokeless tobacco: Never Used  . Tobacco comment: Smoked for a few yrs in the service.  Quit in 1968. Never a heavy smoker.  Substance and Sexual Activity  . Alcohol use: No    Alcohol/week: 0.0 standard drinks  . Drug use: No  . Sexual activity: Not on file  Other Topics Concern  . Not on file  Social History Narrative   Lives between [place] and [place] by himself.  He does not routinely exercise.   Social Determinants of Health   Financial Resource Strain:   . Difficulty of  Paying Living Expenses: Not on file  Food Insecurity:   . Worried About Charity fundraiser in the Last Year: Not on file  . Ran Out of Food in the Last Year: Not on file  Transportation Needs:   . Lack of Transportation (Medical): Not on file  . Lack of Transportation (Non-Medical): Not on file  Physical Activity:   . Days of Exercise per Week: Not on file  . Minutes of Exercise per Session: Not on file  Stress:   . Feeling of Stress : Not on file  Social Connections:   . Frequency of Communication with Friends and Family: Not on file  . Frequency of Social Gatherings with Friends and Family: Not on file  . Attends Religious Services: Not on file  . Active Member of Clubs or Organizations: Not on file  . Attends Archivist Meetings: Not on file  . Marital Status: Not on file  Intimate Partner Violence:   . Fear of Current or Ex-Partner: Not on file  . Emotionally Abused: Not on file  . Physically Abused: Not on file  . Sexually Abused: Not on file    Family History  Problem Relation Age of Onset  . Heart attack Father        deceased @ 73.  . Congestive Heart Failure Sister        alive in her late 35's.  . Other Brother        alive @ 73.  Marland Kitchen CAD Mother        H/o CABG, deceased @ 53.    Review of Systems:  As stated in the HPI and otherwise negative.   BP 122/78   Pulse (!) 59   Ht 6' (1.829 m)   Wt 229 lb (103.9 kg)   SpO2 99%   BMI 31.06 kg/m   Physical Examination: General: Well developed, well nourished, NAD  HEENT: OP clear, mucus membranes moist  SKIN: warm, dry. No rashes. Neuro: No focal deficits  Musculoskeletal: Muscle strength 5/5 all ext  Psychiatric: Mood and affect normal  Neck: No JVD, no carotid bruits, no thyromegaly, no lymphadenopathy.  Lungs:Clear bilaterally, no wheezes, rhonci, crackles Cardiovascular: Regular rate and rhythm. Soft systolic murmur.   Abdomen:Soft. Bowel sounds present. Non-tender.  Extremities: No lower  extremity edema. Pulses are 2 + in the bilateral DP/PT.  EKG:  EKG is not ordered today. The ekg ordered today demonstrates   Echo January 2021:   1. Left ventricular ejection fraction, by visual estimation, is 25 to 30%. The left ventricle has severely decreased function. There is mildly increased left ventricular hypertrophy.  2. Left ventricular diastolic parameters are indeterminate.  3. The left ventricle demonstrates global hypokinesis.  4. Global right ventricle has mildly reduced systolic function.The right ventricular size is normal. No increase in right ventricular wall thickness.  5. Left atrial size was  mildly dilated.  6. Right atrial size was normal.  7. The mitral valve is grossly normal. Trivial mitral valve regurgitation.  8. The tricuspid valve is normal in structure.  9. Aortic valve area, by VTI measures 0.65 cm. 10. Aortic valve mean gradient measures 22.0 mmHg. 11. Aortic valve peak gradient measures 41.5 mmHg. 12. The aortic valve is abnormal. Aortic valve regurgitation is not visualized. Severe aortic valve stenosis. 13. The pulmonic valve was not well visualized. Pulmonic valve regurgitation is not visualized. 14. Moderately elevated pulmonary artery systolic pressure. 15. The inferior vena cava is dilated in size with <50% respiratory variability, suggesting right atrial pressure of 15 mmHg.  FINDINGS  Left Ventricle: Left ventricular ejection fraction, by visual estimation, is 25 to 30%. The left ventricle has severely decreased function. The left ventricle demonstrates global hypokinesis. The left ventricular internal cavity size was the left  ventricle is normal in size. There is mildly increased left ventricular hypertrophy. Left ventricular diastolic parameters are indeterminate.  Right Ventricle: The right ventricular size is normal. No increase in right ventricular wall thickness. Global RV systolic function is has mildly reduced systolic function. The  tricuspid regurgitant velocity is 2.82 m/s, and with an assumed right atrial  pressure of 15 mmHg, the estimated right ventricular systolic pressure is moderately elevated at 46.8 mmHg.  Left Atrium: Left atrial size was mildly dilated.  Right Atrium: Right atrial size was normal in size  Pericardium: There is no evidence of pericardial effusion.  Mitral Valve: The mitral valve is grossly normal. Trivial mitral valve regurgitation. MV peak gradient, 5.7 mmHg.  Tricuspid Valve: The tricuspid valve is normal in structure. Tricuspid valve regurgitation is not demonstrated.  Aortic Valve: The aortic valve is abnormal. Aortic valve regurgitation is not visualized. Severe aortic stenosis is present. Aortic valve mean gradient measures 22.0 mmHg. Aortic valve peak gradient measures 41.5 mmHg. Aortic valve area, by VTI measures  0.65 cm. Aortic valve DVI is 0.14. Low flow low gradient severe aortic stenosis.  Pulmonic Valve: The pulmonic valve was not well visualized. Pulmonic valve regurgitation is not visualized. Pulmonic regurgitation is not visualized.  Aorta: The aortic root is normal in size and structure.  Venous: The inferior vena cava is dilated in size with less than 50% respiratory variability, suggesting right atrial pressure of 15 mmHg.  IAS/Shunts: No atrial level shunt detected by color flow Doppler.    LEFT VENTRICLE PLAX 2D LVIDd:         5.91 cm  Diastology LVIDs:         4.48 cm  LV e' lateral:   12.90 cm/s LV PW:         1.17 cm  LV E/e' lateral: 7.3 LV IVS:        1.29 cm  LV e' medial:    6.53 cm/s LVOT diam:     2.30 cm  LV E/e' medial:  14.4 LV SV:         82 ml LV SV Index:   34.50 LVOT Area:     4.15 cm    RIGHT VENTRICLE RV Basal diam:  4.74 cm  LEFT ATRIUM             Index       RIGHT ATRIUM           Index LA diam:        4.30 cm 1.86 cm/m  RA Area:     25.40 cm LA Vol (A2C):   89.6 ml  38.85 ml/m RA Volume:   72.40 ml  31.39 ml/m LA  Vol (A4C):   87.1 ml 37.77 ml/m LA Biplane Vol: 90.5 ml 39.24 ml/m  AORTIC VALVE                    PULMONIC VALVE AV Area (Vmax):    0.82 cm     PV Vmax:       1.19 m/s AV Area (Vmean):   0.66 cm     PV Vmean:      79.800 cm/s AV Area (VTI):     0.65 cm     PV VTI:        0.213 m AV Vmax:           322.00 cm/s  PV Peak grad:  5.7 mmHg AV Vmean:          215.000 cm/s PV Mean grad:  3.0 mmHg AV VTI:            0.592 m AV Peak Grad:      41.5 mmHg AV Mean Grad:      22.0 mmHg LVOT Vmax:         63.50 cm/s LVOT Vmean:        34.000 cm/s LVOT VTI:          0.093 m LVOT/AV VTI ratio: 0.16   AORTA Ao Root diam: 3.50 cm  MITRAL VALVE                       TRICUSPID VALVE MV Area (PHT): 4.83 cm            TR Peak grad:   31.8 mmHg MV Peak grad:  5.7 mmHg            TR Vmax:        287.00 cm/s MV Mean grad:  2.0 mmHg MV Vmax:       1.19 m/s            SHUNTS MV Vmean:      67.3 cm/s           Systemic VTI:  0.09 m MV VTI:        0.22 m              Systemic Diam: 2.30 cm MV PHT:        45.53 msec MV Decel Time: 157 msec MV E velocity: 93.80 cm/s 103 cm/s  Recent Labs: 08/13/2019: ALT 20 08/15/2019: TSH 0.802 08/16/2019: B Natriuretic Peptide 2,308.0; Magnesium 2.4 08/26/2019: BUN 28; Creatinine, Ser 1.74; Hemoglobin 14.6; Platelets 195; Potassium 3.9; Sodium 136   Lipid Panel    Component Value Date/Time   CHOL 148 07/24/2018 1531   TRIG 41 07/24/2018 1531   HDL 37 (L) 07/24/2018 1531   CHOLHDL 4.0 07/24/2018 1531   VLDL 8 07/24/2018 1531   LDLCALC 103 (H) 07/24/2018 1531     Wt Readings from Last 3 Encounters:  09/02/19 229 lb (103.9 kg)  08/26/19 229 lb (103.9 kg)  08/23/19 230 lb 13.2 oz (104.7 kg)     Other studies Reviewed: Additional studies/ records that were reviewed today include: echo images, hospital notes Review of the above records demonstrates: severe AS   Assessment and Plan:   1. Severe Aortic Valve Stenosis: He has severe, stage D, low flow, low  gradient aortic valve stenosis. I have personally reviewed the echo images. The aortic valve is thickened, calcified with limited leaflet mobility. I  think he would benefit from AVR. Given advanced age, he is not a good candidate for conventional AVR by surgical approach. I think he may be a good candidate for TAVR.   STS Risk Score: Risk of Mortality: 3.899% Renal Failure: 10.498% Permanent Stroke: 1.495% Prolonged Ventilation: 17.116% DSW Infection: 0.170% Reoperation: 5.169% Morbidity or Mortality: 26.213% Short Length of Stay: 8.063% Long Length of Stay: 18.558%   I have reviewed the natural history of aortic stenosis with the patient and their family members  who are present today. We have discussed the limitations of medical therapy and the poor prognosis associated with symptomatic aortic stenosis. We have reviewed potential treatment options, including palliative medical therapy, conventional surgical aortic valve replacement, and transcatheter aortic valve replacement. We discussed treatment options in the context of the patient's specific comorbid medical conditions.   He would like to proceed with planning for TAVR. I will arrange a right and left heart catheterization at [facility] 09/23/19. This will be done one month after his cardioversion to allow for a full month of anti-coagulation prior to stopping the Eliquis for his cath. Risks and benefits of the cath procedure and the valve procedure are reviewed with the patient. After the cath, he will have a cardiac CT, CTA of the chest/abdomen and pelvis, carotid artery dopplers, PT assessment and will then be referred to see one of the CT surgeons on our TAVR team. Pre-cath labs the week of the cath procedure. Pre-hydration given CKD.      Current medicines are reviewed at length with the patient today.  The patient does not have concerns regarding medicines.  The following changes have been made:  no change  Labs/ tests ordered  today include:   Orders Placed This Encounter  Procedures  . CBC  . Basic metabolic panel     Disposition:   FU with the valve team.    Signed, Lauree Chandler, MD 09/02/2019 1:53 PM    [address], [place],   [address] Phone: [phone]; Fax: [phone]

## 2019-09-02 NOTE — Patient Instructions (Signed)
Medication Instructions:  No changes today *If you need a refill on your cardiac medications before your next appointment, please call your pharmacy*  Lab Work: In [facility] at [facility] on 09/21/19 You do not need an appointment for this.  Walk in service.  Testing/Procedures: Your physician has requested that you have a cardiac catheterization. Cardiac catheterization is used to diagnose and/or treat various heart conditions. Doctors may recommend this procedure for a number of different reasons. The most common reason is to evaluate chest pain. Chest pain can be a symptom of coronary artery disease (CAD), and cardiac catheterization can show whether plaque is narrowing or blocking your heart's arteries. This procedure is also used to evaluate the valves, as well as measure the blood flow and oxygen levels in different parts of your heart. For further information please visit [email or web address]. Please follow instruction sheet, as given.   Follow-Up: Theodosia Quay, RN Structural Nurse Navigator will contact you with details of further testing. Other Instructions

## 2019-09-02 NOTE — Progress Notes (Signed)
Structural Heart Clinic Consult Note  Chief Complaint  Patient presents with  . New Patient (Initial Visit)    severe aortic stenosis    History of Present Illness: 86 yo male with history of CAD, atrial fibrillation, stage 3 CKD, HTN, PVCs, ischemic cardiomyopathy and severe aortic stenosis who is here today as a new consult in the structural heart clinic, referred by Dr. Marlou Porch, for discussion regarding his aortic stenosis and possible TAVR. He was admitted to [facility] January 2021 with atrial fib with RVR, nausea and vomiting. Echo January 2021 with LVEF=25-30%. There was severe aortic stenosis. The valve leaflets are thickened and calcified. Mean gradient 22 mmHg, peak gradient 41 mmHg, AVA 0.65 cm2. Dimensionless index 0.14. He is felt to have low flow, low gradient severe AS. He underwent TEE guided cardioversion and was started on Eliquis. Cardiac cath in 2016 with moderate CAD (45% proximal LAD stenosis, 60% intermediate branch stenosis).   He tells me today that he feels well overall. He has no dyspnea, chest pain or dizziness. He does have fatigue. He lives alone. He is here today with his daughter. He does go to the dentist regularly and has no active dental issues. He is a retired Psychologist, sport and exercise.   Primary Care Physician: Leonides Sake, MD Primary Cardiologist: Laurey Arrow) Referring Cardiologist: Marlou Porch  Past Medical History:  Diagnosis Date  . CAD (coronary artery disease)    L&RHC 12/23/2014 elevated LVEDP, prox LAD 45%, 60% ramus  . CKD (chronic kidney disease), stage III   . Essential hypertension   . Murmur   . OA (osteoarthritis)    a. 2011 s/p bilat TKA.  . Ventricular bigeminy    a. 12/2014. Started on amio on 12/24/2014    Past Surgical History:  Procedure Laterality Date  . CARDIAC CATHETERIZATION N/A 12/23/2014   Procedure: Right/Left Heart Cath and Coronary Angiography;  Surgeon: Belva Crome, MD;  Location: [facility];  Service: Cardiovascular;   Laterality: N/A;  . CARDIOVERSION N/A 08/19/2019   Procedure: CARDIOVERSION;  Surgeon: Minna Merritts, MD;  Location: [facility];  Service: Cardiovascular;  Laterality: N/A;  . KNEE SURGERY    . SHOULDER SURGERY Left   . TEE WITHOUT CARDIOVERSION N/A 08/19/2019   Procedure: TRANSESOPHAGEAL ECHOCARDIOGRAM (TEE);  Surgeon: Minna Merritts, MD;  Location: [facility] ORS;  Service: Cardiovascular;  Laterality: N/A;  . TONSILLECTOMY      Current Outpatient Medications  Medication Sig Dispense Refill  . apixaban (ELIQUIS) 2.5 MG TABS tablet Take 1 tablet (2.5 mg total) by mouth 2 (two) times daily. 60 tablet 6  . aspirin EC 81 MG EC tablet Take 1 tablet (81 mg total) by mouth daily.    . furosemide (LASIX) 40 MG tablet Take 1 tablet (40 mg total) by mouth daily. 60 tablet 6  . metoprolol succinate (TOPROL-XL) 50 MG 24 hr tablet Take 1 tablet (50 mg total) by mouth 2 (two) times daily. Take with or immediately following a meal. 60 tablet 6  . Multiple Vitamins-Minerals (MULTIVITAMIN ADULTS 50+ PO) Take 1 tablet by mouth daily.     No current facility-administered medications for this visit.    No Known Allergies  Social History   Socioeconomic History  . Marital status: Divorced    Spouse name: Not on file  . Number of children: 5  . Years of education: Not on file  . Highest education level: Not on file  Occupational History  . Occupation: Retired-Farmer  Tobacco Use  .  Smoking status: Former Research scientist (life sciences)  . Smokeless tobacco: Never Used  . Tobacco comment: Smoked for a few yrs in the service.  Quit in 1968. Never a heavy smoker.  Substance and Sexual Activity  . Alcohol use: No    Alcohol/week: 0.0 standard drinks  . Drug use: No  . Sexual activity: Not on file  Other Topics Concern  . Not on file  Social History Narrative   Lives between [place] and [place] by himself.  He does not routinely exercise.   Social Determinants of Health   Financial Resource Strain:   . Difficulty of  Paying Living Expenses: Not on file  Food Insecurity:   . Worried About Charity fundraiser in the Last Year: Not on file  . Ran Out of Food in the Last Year: Not on file  Transportation Needs:   . Lack of Transportation (Medical): Not on file  . Lack of Transportation (Non-Medical): Not on file  Physical Activity:   . Days of Exercise per Week: Not on file  . Minutes of Exercise per Session: Not on file  Stress:   . Feeling of Stress : Not on file  Social Connections:   . Frequency of Communication with Friends and Family: Not on file  . Frequency of Social Gatherings with Friends and Family: Not on file  . Attends Religious Services: Not on file  . Active Member of Clubs or Organizations: Not on file  . Attends Archivist Meetings: Not on file  . Marital Status: Not on file  Intimate Partner Violence:   . Fear of Current or Ex-Partner: Not on file  . Emotionally Abused: Not on file  . Physically Abused: Not on file  . Sexually Abused: Not on file    Family History  Problem Relation Age of Onset  . Heart attack Father        deceased @ 15.  . Congestive Heart Failure Sister        alive in her late 62's.  . Other Brother        alive @ 79.  Marland Kitchen CAD Mother        H/o CABG, deceased @ 32.    Review of Systems:  As stated in the HPI and otherwise negative.   BP 122/78   Pulse (!) 59   Ht 6' (1.829 m)   Wt 229 lb (103.9 kg)   SpO2 99%   BMI 31.06 kg/m   Physical Examination: General: Well developed, well nourished, NAD  HEENT: OP clear, mucus membranes moist  SKIN: warm, dry. No rashes. Neuro: No focal deficits  Musculoskeletal: Muscle strength 5/5 all ext  Psychiatric: Mood and affect normal  Neck: No JVD, no carotid bruits, no thyromegaly, no lymphadenopathy.  Lungs:Clear bilaterally, no wheezes, rhonci, crackles Cardiovascular: Regular rate and rhythm. Soft systolic murmur.   Abdomen:Soft. Bowel sounds present. Non-tender.  Extremities: No lower  extremity edema. Pulses are 2 + in the bilateral DP/PT.  EKG:  EKG is not ordered today. The ekg ordered today demonstrates   Echo January 2021:   1. Left ventricular ejection fraction, by visual estimation, is 25 to 30%. The left ventricle has severely decreased function. There is mildly increased left ventricular hypertrophy.  2. Left ventricular diastolic parameters are indeterminate.  3. The left ventricle demonstrates global hypokinesis.  4. Global right ventricle has mildly reduced systolic function.The right ventricular size is normal. No increase in right ventricular wall thickness.  5. Left atrial size was  mildly dilated.  6. Right atrial size was normal.  7. The mitral valve is grossly normal. Trivial mitral valve regurgitation.  8. The tricuspid valve is normal in structure.  9. Aortic valve area, by VTI measures 0.65 cm. 10. Aortic valve mean gradient measures 22.0 mmHg. 11. Aortic valve peak gradient measures 41.5 mmHg. 12. The aortic valve is abnormal. Aortic valve regurgitation is not visualized. Severe aortic valve stenosis. 13. The pulmonic valve was not well visualized. Pulmonic valve regurgitation is not visualized. 14. Moderately elevated pulmonary artery systolic pressure. 15. The inferior vena cava is dilated in size with <50% respiratory variability, suggesting right atrial pressure of 15 mmHg.  FINDINGS  Left Ventricle: Left ventricular ejection fraction, by visual estimation, is 25 to 30%. The left ventricle has severely decreased function. The left ventricle demonstrates global hypokinesis. The left ventricular internal cavity size was the left  ventricle is normal in size. There is mildly increased left ventricular hypertrophy. Left ventricular diastolic parameters are indeterminate.  Right Ventricle: The right ventricular size is normal. No increase in right ventricular wall thickness. Global RV systolic function is has mildly reduced systolic function. The  tricuspid regurgitant velocity is 2.82 m/s, and with an assumed right atrial  pressure of 15 mmHg, the estimated right ventricular systolic pressure is moderately elevated at 46.8 mmHg.  Left Atrium: Left atrial size was mildly dilated.  Right Atrium: Right atrial size was normal in size  Pericardium: There is no evidence of pericardial effusion.  Mitral Valve: The mitral valve is grossly normal. Trivial mitral valve regurgitation. MV peak gradient, 5.7 mmHg.  Tricuspid Valve: The tricuspid valve is normal in structure. Tricuspid valve regurgitation is not demonstrated.  Aortic Valve: The aortic valve is abnormal. Aortic valve regurgitation is not visualized. Severe aortic stenosis is present. Aortic valve mean gradient measures 22.0 mmHg. Aortic valve peak gradient measures 41.5 mmHg. Aortic valve area, by VTI measures  0.65 cm. Aortic valve DVI is 0.14. Low flow low gradient severe aortic stenosis.  Pulmonic Valve: The pulmonic valve was not well visualized. Pulmonic valve regurgitation is not visualized. Pulmonic regurgitation is not visualized.  Aorta: The aortic root is normal in size and structure.  Venous: The inferior vena cava is dilated in size with less than 50% respiratory variability, suggesting right atrial pressure of 15 mmHg.  IAS/Shunts: No atrial level shunt detected by color flow Doppler.    LEFT VENTRICLE PLAX 2D LVIDd:         5.91 cm  Diastology LVIDs:         4.48 cm  LV e' lateral:   12.90 cm/s LV PW:         1.17 cm  LV E/e' lateral: 7.3 LV IVS:        1.29 cm  LV e' medial:    6.53 cm/s LVOT diam:     2.30 cm  LV E/e' medial:  14.4 LV SV:         82 ml LV SV Index:   34.50 LVOT Area:     4.15 cm    RIGHT VENTRICLE RV Basal diam:  4.74 cm  LEFT ATRIUM             Index       RIGHT ATRIUM           Index LA diam:        4.30 cm 1.86 cm/m  RA Area:     25.40 cm LA Vol (A2C):   89.6 ml  38.85 ml/m RA Volume:   72.40 ml  31.39 ml/m LA  Vol (A4C):   87.1 ml 37.77 ml/m LA Biplane Vol: 90.5 ml 39.24 ml/m  AORTIC VALVE                    PULMONIC VALVE AV Area (Vmax):    0.82 cm     PV Vmax:       1.19 m/s AV Area (Vmean):   0.66 cm     PV Vmean:      79.800 cm/s AV Area (VTI):     0.65 cm     PV VTI:        0.213 m AV Vmax:           322.00 cm/s  PV Peak grad:  5.7 mmHg AV Vmean:          215.000 cm/s PV Mean grad:  3.0 mmHg AV VTI:            0.592 m AV Peak Grad:      41.5 mmHg AV Mean Grad:      22.0 mmHg LVOT Vmax:         63.50 cm/s LVOT Vmean:        34.000 cm/s LVOT VTI:          0.093 m LVOT/AV VTI ratio: 0.16   AORTA Ao Root diam: 3.50 cm  MITRAL VALVE                       TRICUSPID VALVE MV Area (PHT): 4.83 cm            TR Peak grad:   31.8 mmHg MV Peak grad:  5.7 mmHg            TR Vmax:        287.00 cm/s MV Mean grad:  2.0 mmHg MV Vmax:       1.19 m/s            SHUNTS MV Vmean:      67.3 cm/s           Systemic VTI:  0.09 m MV VTI:        0.22 m              Systemic Diam: 2.30 cm MV PHT:        45.53 msec MV Decel Time: 157 msec MV E velocity: 93.80 cm/s 103 cm/s  Recent Labs: 08/13/2019: ALT 20 08/15/2019: TSH 0.802 08/16/2019: B Natriuretic Peptide 2,308.0; Magnesium 2.4 08/26/2019: BUN 28; Creatinine, Ser 1.74; Hemoglobin 14.6; Platelets 195; Potassium 3.9; Sodium 136   Lipid Panel    Component Value Date/Time   CHOL 148 07/24/2018 1531   TRIG 41 07/24/2018 1531   HDL 37 (L) 07/24/2018 1531   CHOLHDL 4.0 07/24/2018 1531   VLDL 8 07/24/2018 1531   LDLCALC 103 (H) 07/24/2018 1531     Wt Readings from Last 3 Encounters:  09/02/19 229 lb (103.9 kg)  08/26/19 229 lb (103.9 kg)  08/23/19 230 lb 13.2 oz (104.7 kg)     Other studies Reviewed: Additional studies/ records that were reviewed today include: echo images, hospital notes Review of the above records demonstrates: severe AS   Assessment and Plan:   1. Severe Aortic Valve Stenosis: He has severe, stage D, low flow, low  gradient aortic valve stenosis. I have personally reviewed the echo images. The aortic valve is thickened, calcified with limited leaflet mobility. I  think he would benefit from AVR. Given advanced age, he is not a good candidate for conventional AVR by surgical approach. I think he may be a good candidate for TAVR.   STS Risk Score: Risk of Mortality: 3.899% Renal Failure: 10.498% Permanent Stroke: 1.495% Prolonged Ventilation: 17.116% DSW Infection: 0.170% Reoperation: 5.169% Morbidity or Mortality: 26.213% Short Length of Stay: 8.063% Long Length of Stay: 18.558%   I have reviewed the natural history of aortic stenosis with the patient and their family members  who are present today. We have discussed the limitations of medical therapy and the poor prognosis associated with symptomatic aortic stenosis. We have reviewed potential treatment options, including palliative medical therapy, conventional surgical aortic valve replacement, and transcatheter aortic valve replacement. We discussed treatment options in the context of the patient's specific comorbid medical conditions.   He would like to proceed with planning for TAVR. I will arrange a right and left heart catheterization at [facility] 09/23/19. This will be done one month after his cardioversion to allow for a full month of anti-coagulation prior to stopping the Eliquis for his cath. Risks and benefits of the cath procedure and the valve procedure are reviewed with the patient. After the cath, he will have a cardiac CT, CTA of the chest/abdomen and pelvis, carotid artery dopplers, PT assessment and will then be referred to see one of the CT surgeons on our TAVR team. Pre-cath labs the week of the cath procedure. Pre-hydration given CKD.      Current medicines are reviewed at length with the patient today.  The patient does not have concerns regarding medicines.  The following changes have been made:  no change  Labs/ tests ordered  today include:   Orders Placed This Encounter  Procedures  . CBC  . Basic metabolic panel     Disposition:   FU with the valve team.    Signed, Lauree Chandler, MD 09/02/2019 1:53 PM    [address], [place], [place]  [address] Phone: [phone]; Fax: [phone]

## 2019-09-02 NOTE — Telephone Encounter (Signed)
The patient is seeing  Dr. Angelena Form today for TAVR consult.  See note from TCM follow up with Truitt Merle, NP on 08/26/19:   History of Present Illness: Clinton Holmes is a 86 y.o. male who presents today for a post hospital/TOC visit. He has seen Dr. Shirl Harris & Caryl Comes. He has been in the [place] office as well.  Noted that he was going to transfer his care to the [place] office.   Will route to Dr. Marlou Porch and Dr. Donivan Scull nurse for further follow up.

## 2019-09-03 ENCOUNTER — Telehealth: Payer: Self-pay | Admitting: Cardiovascular Disease

## 2019-09-03 NOTE — Telephone Encounter (Signed)
Home health calling for PT orders 2 x week for 4 weeks

## 2019-09-03 NOTE — Telephone Encounter (Signed)
Spoke with Collier Salina and requested that he fax PT orders here to our office and we will take care of those.

## 2019-09-03 NOTE — Telephone Encounter (Signed)
I called and spoke with patient's daughter, D. Staley who states that she has switched from Dr. Marlou Porch to Dr. Rockey Situ because it is more convenient for the patient to be seen in [place]. I called Collier Salina from [facility] and informed him that Dr. Rockey Situ will be the patient's primary cardiologist, so the orders can be sent to the [facility] office, and that he can also request patient's primary doctor sign the orders. Peter from [facility] verbalizes understanding and is appreciative for the information provided.  The patient has started receiving PT at home. Per Dr. Marlou Porch nurse, Dr. Marlou Porch does not sign HH orders for PT.

## 2019-09-03 NOTE — Telephone Encounter (Signed)
Pt of Dr. Marlou Porch, Dr. Fletcher Anon, Dr. Angelena Form and home health needs orders to be signed. Not clear as to why it keeps coming back to Dr. Rockey Situ but I did call home health agency with our fax number for orders to be given for home health.

## 2019-09-03 NOTE — Telephone Encounter (Signed)
I have spoke with them and provided our fax number for orders to be sent here for Dr. Rockey Situ to sign.

## 2019-09-07 DIAGNOSIS — N170 Acute kidney failure with tubular necrosis: Secondary | ICD-10-CM | POA: Diagnosis not present

## 2019-09-07 DIAGNOSIS — I0981 Rheumatic heart failure: Secondary | ICD-10-CM | POA: Diagnosis not present

## 2019-09-07 DIAGNOSIS — I5023 Acute on chronic systolic (congestive) heart failure: Secondary | ICD-10-CM | POA: Diagnosis not present

## 2019-09-07 DIAGNOSIS — I130 Hypertensive heart and chronic kidney disease with heart failure and stage 1 through stage 4 chronic kidney disease, or unspecified chronic kidney disease: Secondary | ICD-10-CM | POA: Diagnosis not present

## 2019-09-07 DIAGNOSIS — N1832 Chronic kidney disease, stage 3b: Secondary | ICD-10-CM | POA: Diagnosis not present

## 2019-09-07 DIAGNOSIS — I2510 Atherosclerotic heart disease of native coronary artery without angina pectoris: Secondary | ICD-10-CM | POA: Diagnosis not present

## 2019-09-09 ENCOUNTER — Telehealth: Payer: Self-pay | Admitting: Cardiovascular Disease

## 2019-09-09 ENCOUNTER — Other Ambulatory Visit: Payer: Self-pay

## 2019-09-09 ENCOUNTER — Encounter: Payer: Self-pay | Admitting: Internal Medicine

## 2019-09-09 ENCOUNTER — Ambulatory Visit (INDEPENDENT_AMBULATORY_CARE_PROVIDER_SITE_OTHER): Payer: Medicare Other | Admitting: Internal Medicine

## 2019-09-09 VITALS — BP 102/58 | HR 60 | Ht 72.0 in | Wt 231.0 lb

## 2019-09-09 DIAGNOSIS — N170 Acute kidney failure with tubular necrosis: Secondary | ICD-10-CM | POA: Diagnosis not present

## 2019-09-09 DIAGNOSIS — Z79899 Other long term (current) drug therapy: Secondary | ICD-10-CM | POA: Diagnosis not present

## 2019-09-09 DIAGNOSIS — I350 Nonrheumatic aortic (valve) stenosis: Secondary | ICD-10-CM | POA: Diagnosis not present

## 2019-09-09 DIAGNOSIS — I5022 Chronic systolic (congestive) heart failure: Secondary | ICD-10-CM

## 2019-09-09 DIAGNOSIS — I493 Ventricular premature depolarization: Secondary | ICD-10-CM | POA: Insufficient documentation

## 2019-09-09 DIAGNOSIS — I5023 Acute on chronic systolic (congestive) heart failure: Secondary | ICD-10-CM | POA: Diagnosis not present

## 2019-09-09 DIAGNOSIS — I10 Essential (primary) hypertension: Secondary | ICD-10-CM

## 2019-09-09 DIAGNOSIS — I0981 Rheumatic heart failure: Secondary | ICD-10-CM | POA: Diagnosis not present

## 2019-09-09 DIAGNOSIS — I4819 Other persistent atrial fibrillation: Secondary | ICD-10-CM

## 2019-09-09 DIAGNOSIS — I2510 Atherosclerotic heart disease of native coronary artery without angina pectoris: Secondary | ICD-10-CM | POA: Diagnosis not present

## 2019-09-09 DIAGNOSIS — I130 Hypertensive heart and chronic kidney disease with heart failure and stage 1 through stage 4 chronic kidney disease, or unspecified chronic kidney disease: Secondary | ICD-10-CM | POA: Diagnosis not present

## 2019-09-09 DIAGNOSIS — N1832 Chronic kidney disease, stage 3b: Secondary | ICD-10-CM | POA: Diagnosis not present

## 2019-09-09 NOTE — Telephone Encounter (Signed)
Spoke with home health nurse and reports heart rate of 33 at rest. No other symptoms reported, no shortness of breath, and no other complaints. Blood pressure is 148/72. Advised that I would send to provider for review and recommendations and would call her back. She verbalized understanding with no further questions.

## 2019-09-09 NOTE — Patient Instructions (Signed)
Medication Instructions:  Your physician recommends that you continue on your current medications as directed. Please refer to the Current Medication list given to you today.  *If you need a refill on your cardiac medications before your next appointment, please call your pharmacy*  Lab Work: Your physician recommends that you return for lab work in: [place], [place].  If you have labs (blood work) drawn today and your tests are completely normal, you will receive your results only by: Marland Kitchen MyChart Message (if you have MyChart) OR . A paper copy in the mail If you have any lab test that is abnormal or we need to change your treatment, we will call you to review the results.  Testing/Procedures: none  Follow-Up: At [facility], you and your health needs are our priority.  As part of our continuing mission to provide you with exceptional heart care, we have created designated Provider Care Teams.  These Care Teams include your primary Cardiologist (physician) and Advanced Practice Providers (APPs -  Physician Assistants and Nurse Practitioners) who all work together to provide you with the care you need, when you need it.  Your next appointment:   1 month(s) with Dr Fletcher Anon or APP.  The format for your next appointment:   In Person  Provider:    You may see Dr Fletcher Anon or one of the following Advanced Practice Providers on your designated Care Team:    Murray Hodgkins, NP  Christell Faith, PA-C  Marrianne Mood, [place]

## 2019-09-09 NOTE — Telephone Encounter (Signed)
STAT if HR is under 50 or over 120 (normal HR is 60-100 beats per minute)  1) What is your heart rate? 30 's resting   2) Do you have a log of your heart rate readings (document readings)?  Normal for patient is 60's   3) Do you have any other symptoms? denies per home health nurse connie.

## 2019-09-09 NOTE — Telephone Encounter (Signed)
Spoke with Dr. Saunders Revel DOD and recommendations to have him come in to see him if possible today. Will call to see if he can come.

## 2019-09-09 NOTE — Telephone Encounter (Signed)
Spoke with daughter per release form and she was agreeable to bring him at 3:20 in today to be seen by provider. Pt does take metoprolol XL 50 mg twice a day. Will make provider aware of this and she was agreeable with appointment.

## 2019-09-09 NOTE — Telephone Encounter (Signed)
Spoke with nurse who reports with ambulation it is staying in the 40's. Reviewed recommendations for him to come in to be seen today per DOD but family states that she is unable to bring him today due to her having appointment. She did provide me number with other sister to see if she is able to bring him in.

## 2019-09-09 NOTE — Progress Notes (Signed)
Follow-up Outpatient Visit Date: 09/09/2019  Primary Care Provider: Leonides Sake, [place] [address]  Chief Complaint: Low heart rate  HPI:  [name] is a 86 y.o. male with history of nonobstructive coronary artery disease, atrial fibrillation, ischemic cardiomyopathy, severe aortic stenosis, hypertension, PVC's, and CKD stage 3, who presents for urgent evaluation of bradycardia.  He has been seen by multiple providers in our practice, most recently Dr. Angelena Form a week ago for evaluation of aortic stenosis and consideration for TAVR.  At that time, he felt well without chest pain, shortness of breath, and dizziness.  He noted some fatigue.  He had been hospitalized earlier this month with acute systolic heart failure and persistent atrial fibrillation.  Severe aortic stenosis was uncovered at that time.  He underwent successful TEE guided cardioversion and has remained on apixaban and furosemide.  Furosemide was decreased from 40 mg twice daily to 40 mg daily after follow-up appointment on 08/27/2019.  Today, he was evaluated by home physical therapy today and was noted to have a resting heart rate in the 30's ([name] was reportedly asymptomatic).  Today, [name] reports that he is feeling well.  He has not had any chest pain, shortness of breath, palpitations, lightheadedness, or edema.  He reports that his energy is at baseline.  Low heart rates were first noted by physical therapy earlier this week.  --------------------------------------------------------------------------------------------------  Past Medical History:  Diagnosis Date  . CAD (coronary artery disease)    L&RHC 12/23/2014 elevated LVEDP, prox LAD 45%, 60% ramus  . CKD (chronic kidney disease), stage III   . Essential hypertension   . Ischemic cardiomyopathy   . Murmur   . OA (osteoarthritis)    a. 2011 s/p bilat TKA.  . Severe aortic stenosis   . Ventricular bigeminy    a. 12/2014.  Started on amio on 12/24/2014   Past Surgical History:  Procedure Laterality Date  . CARDIAC CATHETERIZATION N/A 12/23/2014   Procedure: Right/Left Heart Cath and Coronary Angiography;  Surgeon: Belva Crome, MD;  Location: [facility];  Service: Cardiovascular;  Laterality: N/A;  . CARDIOVERSION N/A 08/19/2019   Procedure: CARDIOVERSION;  Surgeon: Minna Merritts, MD;  Location: [facility];  Service: Cardiovascular;  Laterality: N/A;  . KNEE SURGERY    . SHOULDER SURGERY Left   . TEE WITHOUT CARDIOVERSION N/A 08/19/2019   Procedure: TRANSESOPHAGEAL ECHOCARDIOGRAM (TEE);  Surgeon: Minna Merritts, MD;  Location: [facility];  Service: Cardiovascular;  Laterality: N/A;  . TONSILLECTOMY      Current Meds  Medication Sig  . apixaban (ELIQUIS) 2.5 MG TABS tablet Take 1 tablet (2.5 mg total) by mouth 2 (two) times daily.  Marland Kitchen aspirin EC 81 MG EC tablet Take 1 tablet (81 mg total) by mouth daily.  . furosemide (LASIX) 40 MG tablet Take 1 tablet (40 mg total) by mouth daily.  . metoprolol succinate (TOPROL-XL) 50 MG 24 hr tablet Take 1 tablet (50 mg total) by mouth 2 (two) times daily. Take with or immediately following a meal.  . Multiple Vitamins-Minerals (MULTIVITAMIN ADULTS 50+ PO) Take 1 tablet by mouth daily.    Allergies: Patient has no known allergies.  Social History   Tobacco Use  . Smoking status: Former Research scientist (life sciences)  . Smokeless tobacco: Never Used  . Tobacco comment: Smoked for a few yrs in the service.  Quit in 1968. Never a heavy smoker.  Substance Use Topics  . Alcohol use: No  Alcohol/week: 0.0 standard drinks  . Drug use: No    Family History  Problem Relation Age of Onset  . Heart attack Father        deceased @ 33.  . Congestive Heart Failure Sister        alive in her late 61's.  . Other Brother        alive @ 21.  Marland Kitchen CAD Mother        H/o CABG, deceased @ 41.    Review of Systems: A 12-system review of systems was performed and was negative except as noted  in the HPI.  --------------------------------------------------------------------------------------------------  Physical Exam: BP (!) 102/58 (BP Location: Right Arm, Patient Position: Sitting, Cuff Size: Large)   Pulse 60   Ht 6' (1.829 m)   Wt 231 lb (104.8 kg)   BMI 31.33 kg/m   General: NAD. HEENT: No conjunctival pallor or scleral icterus. Facemask in place. Neck: Supple without lymphadenopathy, thyromegaly, JVD, or HJR. Lungs: Normal work of breathing. Clear to auscultation bilaterally without wheezes or crackles. Heart: Bradycardic but regular with 2/6 systolic murmur loudest at the left upper sternal border.  No rubs or gallops. Abd: Bowel sounds present. Soft, NT/ND without hepatosplenomegaly Ext: No lower extremity edema. Radial, PT, and DP pulses are 2+ bilaterally. Skin: Warm and dry without rash.  EKG: Sinus rhythm with frequent multifocal PVCs in a pattern of bigeminy.  LVH with nonspecific ST/T changes, most likely reflecting abnormal repolarization.  Lab Results  Component Value Date   WBC 8.8 08/26/2019   HGB 14.6 08/26/2019   HCT 44.0 08/26/2019   MCV 88 08/26/2019   PLT 195 08/26/2019    Lab Results  Component Value Date   NA 136 08/26/2019   K 3.9 08/26/2019   CL 92 (L) 08/26/2019   CO2 30 (H) 08/26/2019   BUN 28 (H) 08/26/2019   CREATININE 1.74 (H) 08/26/2019   GLUCOSE 81 08/26/2019   ALT 20 08/13/2019    Lab Results  Component Value Date   CHOL 148 07/24/2018   HDL 37 (L) 07/24/2018   LDLCALC 103 (H) 07/24/2018   TRIG 41 07/24/2018   CHOLHDL 4.0 07/24/2018    --------------------------------------------------------------------------------------------------  ASSESSMENT AND PLAN: PVCs: Apparent bradycardia by physical exam is consistent with findings of frequent PVCs in the pattern of ventricular bigeminy.  As [name] is asymptomatic, I do not recommend any acute intervention.  I will check a BMP and magnesium level today to ensure that  electrolyte abnormalities or not driving his frequent PVCs.  We will continue his current dose of metoprolol.  If he develops symptoms including lightheadedness, chest pain, or shortness of breath, he should contact us or seek immediate medical attention.  Severe aortic stenosis: Asymptomatic at this time.  Continue ongoing work-up per Dr. Angelena Form.  Chronic HFrEF: [name] appears euvolemic and well compensated today.  We will continue current dose of furosemide 40 mg daily as well as metoprolol succinate 50 mg twice daily.  I will defer adding an ACE inhibitor or ARB at this time in the setting of his chronic kidney disease and borderline low blood pressure.  Persistent atrial fibrillation: [name] is status post cardioversion earlier this month and appears to be maintaining sinus rhythm.  He should remain on apixaban 2.5 mg twice daily (dose reduced based on creatinine and age).  Follow-up: Return to clinic in 1 month with Dr. Fletcher Anon or APP.  Nelva Bush, MD 09/09/2019 9:22 PM

## 2019-09-10 DIAGNOSIS — I0981 Rheumatic heart failure: Secondary | ICD-10-CM | POA: Diagnosis not present

## 2019-09-10 DIAGNOSIS — I130 Hypertensive heart and chronic kidney disease with heart failure and stage 1 through stage 4 chronic kidney disease, or unspecified chronic kidney disease: Secondary | ICD-10-CM | POA: Diagnosis not present

## 2019-09-10 DIAGNOSIS — I5023 Acute on chronic systolic (congestive) heart failure: Secondary | ICD-10-CM | POA: Diagnosis not present

## 2019-09-10 DIAGNOSIS — I2510 Atherosclerotic heart disease of native coronary artery without angina pectoris: Secondary | ICD-10-CM | POA: Diagnosis not present

## 2019-09-10 DIAGNOSIS — N170 Acute kidney failure with tubular necrosis: Secondary | ICD-10-CM | POA: Diagnosis not present

## 2019-09-10 DIAGNOSIS — N1832 Chronic kidney disease, stage 3b: Secondary | ICD-10-CM | POA: Diagnosis not present

## 2019-09-10 LAB — BASIC METABOLIC PANEL
BUN/Creatinine Ratio: 22 (ref 10–24)
BUN: 35 mg/dL — ABNORMAL HIGH (ref 8–27)
CO2: 24 mmol/L (ref 20–29)
Calcium: 9.8 mg/dL (ref 8.6–10.2)
Chloride: 103 mmol/L (ref 96–106)
Creatinine, Ser: 1.58 mg/dL — ABNORMAL HIGH (ref 0.76–1.27)
GFR calc Af Amer: 44 mL/min/{1.73_m2} — ABNORMAL LOW (ref >59)
GFR calc non Af Amer: 38 mL/min/{1.73_m2} — ABNORMAL LOW (ref >59)
Glucose: 101 mg/dL — ABNORMAL HIGH (ref 65–99)
Potassium: 4.4 mmol/L (ref 3.5–5.2)
Sodium: 142 mmol/L (ref 134–144)

## 2019-09-10 LAB — MAGNESIUM: Magnesium: 2.3 mg/dL (ref 1.6–2.3)

## 2019-09-15 DIAGNOSIS — N1832 Chronic kidney disease, stage 3b: Secondary | ICD-10-CM | POA: Diagnosis not present

## 2019-09-15 DIAGNOSIS — N170 Acute kidney failure with tubular necrosis: Secondary | ICD-10-CM | POA: Diagnosis not present

## 2019-09-15 DIAGNOSIS — I0981 Rheumatic heart failure: Secondary | ICD-10-CM | POA: Diagnosis not present

## 2019-09-15 DIAGNOSIS — I5023 Acute on chronic systolic (congestive) heart failure: Secondary | ICD-10-CM | POA: Diagnosis not present

## 2019-09-15 DIAGNOSIS — I130 Hypertensive heart and chronic kidney disease with heart failure and stage 1 through stage 4 chronic kidney disease, or unspecified chronic kidney disease: Secondary | ICD-10-CM | POA: Diagnosis not present

## 2019-09-15 DIAGNOSIS — I2510 Atherosclerotic heart disease of native coronary artery without angina pectoris: Secondary | ICD-10-CM | POA: Diagnosis not present

## 2019-09-17 DIAGNOSIS — I0981 Rheumatic heart failure: Secondary | ICD-10-CM | POA: Diagnosis not present

## 2019-09-17 DIAGNOSIS — I5023 Acute on chronic systolic (congestive) heart failure: Secondary | ICD-10-CM | POA: Diagnosis not present

## 2019-09-17 DIAGNOSIS — N170 Acute kidney failure with tubular necrosis: Secondary | ICD-10-CM | POA: Diagnosis not present

## 2019-09-17 DIAGNOSIS — I2510 Atherosclerotic heart disease of native coronary artery without angina pectoris: Secondary | ICD-10-CM | POA: Diagnosis not present

## 2019-09-17 DIAGNOSIS — N1832 Chronic kidney disease, stage 3b: Secondary | ICD-10-CM | POA: Diagnosis not present

## 2019-09-17 DIAGNOSIS — I130 Hypertensive heart and chronic kidney disease with heart failure and stage 1 through stage 4 chronic kidney disease, or unspecified chronic kidney disease: Secondary | ICD-10-CM | POA: Diagnosis not present

## 2019-09-18 ENCOUNTER — Other Ambulatory Visit: Payer: Self-pay | Admitting: Physician Assistant

## 2019-09-18 DIAGNOSIS — I2510 Atherosclerotic heart disease of native coronary artery without angina pectoris: Secondary | ICD-10-CM | POA: Diagnosis not present

## 2019-09-18 DIAGNOSIS — I0981 Rheumatic heart failure: Secondary | ICD-10-CM | POA: Diagnosis not present

## 2019-09-18 DIAGNOSIS — I350 Nonrheumatic aortic (valve) stenosis: Secondary | ICD-10-CM

## 2019-09-18 DIAGNOSIS — N170 Acute kidney failure with tubular necrosis: Secondary | ICD-10-CM | POA: Diagnosis not present

## 2019-09-18 DIAGNOSIS — N1832 Chronic kidney disease, stage 3b: Secondary | ICD-10-CM | POA: Diagnosis not present

## 2019-09-18 DIAGNOSIS — I5023 Acute on chronic systolic (congestive) heart failure: Secondary | ICD-10-CM | POA: Diagnosis not present

## 2019-09-18 DIAGNOSIS — I130 Hypertensive heart and chronic kidney disease with heart failure and stage 1 through stage 4 chronic kidney disease, or unspecified chronic kidney disease: Secondary | ICD-10-CM | POA: Diagnosis not present

## 2019-09-21 ENCOUNTER — Other Ambulatory Visit
Admission: RE | Admit: 2019-09-21 | Discharge: 2019-09-21 | Disposition: A | Discharge status: Home / Self Care | Payer: Medicare Other | Source: Ambulatory Visit | Attending: Cardiovascular Disease | Admitting: Cardiovascular Disease

## 2019-09-21 ENCOUNTER — Other Ambulatory Visit: Payer: Self-pay

## 2019-09-21 ENCOUNTER — Telehealth: Payer: Self-pay | Admitting: *Deleted

## 2019-09-21 DIAGNOSIS — N1832 Chronic kidney disease, stage 3b: Secondary | ICD-10-CM | POA: Diagnosis not present

## 2019-09-21 DIAGNOSIS — Z20822 Contact with and (suspected) exposure to covid-19: Secondary | ICD-10-CM | POA: Diagnosis not present

## 2019-09-21 DIAGNOSIS — I2510 Atherosclerotic heart disease of native coronary artery without angina pectoris: Secondary | ICD-10-CM | POA: Diagnosis not present

## 2019-09-21 DIAGNOSIS — I0981 Rheumatic heart failure: Secondary | ICD-10-CM | POA: Diagnosis not present

## 2019-09-21 DIAGNOSIS — Z01812 Encounter for preprocedural laboratory examination: Secondary | ICD-10-CM | POA: Diagnosis not present

## 2019-09-21 DIAGNOSIS — N170 Acute kidney failure with tubular necrosis: Secondary | ICD-10-CM | POA: Diagnosis not present

## 2019-09-21 DIAGNOSIS — I5023 Acute on chronic systolic (congestive) heart failure: Secondary | ICD-10-CM | POA: Diagnosis not present

## 2019-09-21 DIAGNOSIS — I130 Hypertensive heart and chronic kidney disease with heart failure and stage 1 through stage 4 chronic kidney disease, or unspecified chronic kidney disease: Secondary | ICD-10-CM | POA: Diagnosis not present

## 2019-09-21 LAB — SARS CORONAVIRUS 2 (TAT 6-24 HRS): SARS Coronavirus 2: NEGATIVE

## 2019-09-21 NOTE — Telephone Encounter (Signed)
Pt contacted pre-catheterization scheduled at [facility] for: Wednesday September 23, 2019 1 PM Verified arrival time and place: [facility]) at: 8 AM-pre procedure hydration   No solid food after midnight prior to cath, clear liquids until 5 AM day of procedure. Contrast allergy: no  Hold: Eliquis-none 09/21/19 until post procedure. Lasix-day before and day of procedure.  Except hold medications AM meds can be  taken pre-cath with sip of water including: ASA 81 mg   Confirmed patient has responsible adult to drive home post procedure and observe 24 hours after arriving home: yes  Currently, due to Covid-19 pandemic, only one person will be allowed with patient. Must be the same person for patient's entire stay and will be required to wear a mask. They will be asked to wait in the waiting room for the duration of the patient's stay.  Patients are required to wear a mask when they enter the hospital.      COVID-19 Pre-Screening Questions:  . In the past 7 to 10 days have you had a cough,  shortness of breath, headache, congestion, fever (100 or greater) body aches, chills, sore throat, or sudden loss of taste or sense of smell? no . Have you been around anyone with known Covid 19 in the past 7-10 days? no . Have you been around anyone who is awaiting Covid 19 test results in the past 7 to 10 days? no . Have you been around anyone who has been exposed to Covid 19, or has mentioned symptoms of Covid 19 within the past 7 to 10 days? No  I reviewed procedure/mask/visitor instructions, Covid-19 screening questions with patient's daughter (DPR), [name], she verbalized understanding, thanked me for call.  Pt did not have pre procedure BMP/CBC done today at [facility], pt's daughter unable to make arrangements for pt to get lab prior to procedure 09/23/19, STAT BMP/CBC ordered to be done on arrival at Short Stay Wednesday morning.

## 2019-09-23 ENCOUNTER — Ambulatory Visit (HOSPITAL_COMMUNITY)
Admission: RE | Admit: 2019-09-23 | Discharge: 2019-09-23 | Disposition: A | Discharge status: Home / Self Care | Payer: Medicare Other | Attending: Cardiovascular Disease | Admitting: Cardiovascular Disease

## 2019-09-23 ENCOUNTER — Encounter (HOSPITAL_COMMUNITY)
Admission: RE | Disposition: A | Discharge status: Home / Self Care | Payer: Medicare Other | Source: Home / Self Care | Attending: Cardiovascular Disease

## 2019-09-23 ENCOUNTER — Other Ambulatory Visit: Payer: Self-pay

## 2019-09-23 DIAGNOSIS — Z7982 Long term (current) use of aspirin: Secondary | ICD-10-CM | POA: Insufficient documentation

## 2019-09-23 DIAGNOSIS — I350 Nonrheumatic aortic (valve) stenosis: Secondary | ICD-10-CM | POA: Diagnosis not present

## 2019-09-23 DIAGNOSIS — N183 Chronic kidney disease, stage 3 unspecified: Secondary | ICD-10-CM | POA: Diagnosis not present

## 2019-09-23 DIAGNOSIS — Z79899 Other long term (current) drug therapy: Secondary | ICD-10-CM | POA: Insufficient documentation

## 2019-09-23 DIAGNOSIS — Z87891 Personal history of nicotine dependence: Secondary | ICD-10-CM | POA: Insufficient documentation

## 2019-09-23 DIAGNOSIS — I2510 Atherosclerotic heart disease of native coronary artery without angina pectoris: Secondary | ICD-10-CM | POA: Insufficient documentation

## 2019-09-23 DIAGNOSIS — I129 Hypertensive chronic kidney disease with stage 1 through stage 4 chronic kidney disease, or unspecified chronic kidney disease: Secondary | ICD-10-CM | POA: Insufficient documentation

## 2019-09-23 DIAGNOSIS — I4891 Unspecified atrial fibrillation: Secondary | ICD-10-CM | POA: Insufficient documentation

## 2019-09-23 DIAGNOSIS — I255 Ischemic cardiomyopathy: Secondary | ICD-10-CM | POA: Insufficient documentation

## 2019-09-23 DIAGNOSIS — Z7901 Long term (current) use of anticoagulants: Secondary | ICD-10-CM | POA: Diagnosis not present

## 2019-09-23 DIAGNOSIS — Z01812 Encounter for preprocedural laboratory examination: Secondary | ICD-10-CM

## 2019-09-23 HISTORY — PX: RIGHT/LEFT HEART CATH AND CORONARY ANGIOGRAPHY: CATH118266

## 2019-09-23 LAB — BASIC METABOLIC PANEL
Anion gap: 10 (ref 5–15)
BUN: 26 mg/dL — ABNORMAL HIGH (ref 8–23)
CO2: 25 mmol/L (ref 22–32)
Calcium: 9.8 mg/dL (ref 8.9–10.3)
Chloride: 104 mmol/L (ref 98–111)
Creatinine, Ser: 1.56 mg/dL — ABNORMAL HIGH (ref 0.61–1.24)
GFR calc Af Amer: 45 mL/min — ABNORMAL LOW (ref >60)
GFR calc non Af Amer: 39 mL/min — ABNORMAL LOW (ref >60)
Glucose, Bld: 95 mg/dL (ref 70–99)
Potassium: 4.3 mmol/L (ref 3.5–5.1)
Sodium: 139 mmol/L (ref 135–145)

## 2019-09-23 LAB — CBC
HCT: 40.7 % (ref 39.0–52.0)
Hemoglobin: 13.2 g/dL (ref 13.0–17.0)
MCH: 29.5 pg (ref 26.0–34.0)
MCHC: 32.4 g/dL (ref 30.0–36.0)
MCV: 90.8 fL (ref 80.0–100.0)
Platelets: 171 10*3/uL (ref 150–400)
RBC: 4.48 MIL/uL (ref 4.22–5.81)
RDW: 14.5 % (ref 11.5–15.5)
WBC: 7 10*3/uL (ref 4.0–10.5)
nRBC: 0 % (ref 0.0–0.2)

## 2019-09-23 LAB — POCT I-STAT 7, (LYTES, BLD GAS, ICA,H+H)
Acid-base deficit: 1 mmol/L (ref 0.0–2.0)
Bicarbonate: 24.8 mmol/L (ref 20.0–28.0)
Calcium, Ion: 1.25 mmol/L (ref 1.15–1.40)
HCT: 35 % — ABNORMAL LOW (ref 39.0–52.0)
Hemoglobin: 11.9 g/dL — ABNORMAL LOW (ref 13.0–17.0)
O2 Saturation: 99 %
Potassium: 3.9 mmol/L (ref 3.5–5.1)
Sodium: 143 mmol/L (ref 135–145)
TCO2: 26 mmol/L (ref 22–32)
pCO2 arterial: 43.4 mmHg (ref 32.0–48.0)
pH, Arterial: 7.364 (ref 7.350–7.450)
pO2, Arterial: 135 mmHg — ABNORMAL HIGH (ref 83.0–108.0)

## 2019-09-23 LAB — POCT I-STAT EG7
Bicarbonate: 25.7 mmol/L (ref 20.0–28.0)
Calcium, Ion: 1.23 mmol/L (ref 1.15–1.40)
HCT: 35 % — ABNORMAL LOW (ref 39.0–52.0)
Hemoglobin: 11.9 g/dL — ABNORMAL LOW (ref 13.0–17.0)
O2 Saturation: 69 %
Potassium: 3.9 mmol/L (ref 3.5–5.1)
Sodium: 144 mmol/L (ref 135–145)
TCO2: 27 mmol/L (ref 22–32)
pCO2, Ven: 47.5 mmHg (ref 44.0–60.0)
pH, Ven: 7.341 (ref 7.250–7.430)
pO2, Ven: 38 mmHg (ref 32.0–45.0)

## 2019-09-23 SURGERY — RIGHT/LEFT HEART CATH AND CORONARY ANGIOGRAPHY
Anesthesia: LOCAL

## 2019-09-23 MED ORDER — MIDAZOLAM HCL 2 MG/2ML IJ SOLN
INTRAMUSCULAR | Status: DC | PRN
  Administered 2019-09-23: 1 mg via INTRAVENOUS

## 2019-09-23 MED ORDER — SODIUM CHLORIDE 0.9% FLUSH: 3.0000 mL | INTRAVENOUS | Status: DC | PRN

## 2019-09-23 MED ORDER — SODIUM CHLORIDE 0.9 % IV SOLN: 250.0000 mL | INTRAVENOUS | Status: DC | PRN

## 2019-09-23 MED ORDER — SODIUM CHLORIDE 0.9% FLUSH: 3.0000 mL | Freq: Two times a day (BID) | INTRAVENOUS | Status: DC

## 2019-09-23 MED ORDER — HYDRALAZINE HCL 20 MG/ML IJ SOLN
INTRAMUSCULAR | Status: AC
  Filled 2019-09-23: qty 1

## 2019-09-23 MED ORDER — LIDOCAINE HCL (PF) 1 % IJ SOLN
INTRAMUSCULAR | Status: AC
  Filled 2019-09-23: qty 30

## 2019-09-23 MED ORDER — HEPARIN (PORCINE) IN NACL 1000-0.9 UT/500ML-% IV SOLN
INTRAVENOUS | Status: AC
  Filled 2019-09-23: qty 1000

## 2019-09-23 MED ORDER — SODIUM CHLORIDE 0.9 % IV SOLN: INTRAVENOUS | Status: AC

## 2019-09-23 MED ORDER — HEPARIN SODIUM (PORCINE) 1000 UNIT/ML IJ SOLN
INTRAMUSCULAR | Status: AC
  Filled 2019-09-23: qty 1

## 2019-09-23 MED ORDER — FENTANYL CITRATE (PF) 100 MCG/2ML IJ SOLN
INTRAMUSCULAR | Status: DC | PRN
  Administered 2019-09-23: 25 ug via INTRAVENOUS

## 2019-09-23 MED ORDER — VERAPAMIL HCL 2.5 MG/ML IV SOLN
INTRAVENOUS | Status: AC
  Filled 2019-09-23: qty 2

## 2019-09-23 MED ORDER — HEPARIN (PORCINE) IN NACL 1000-0.9 UT/500ML-% IV SOLN
INTRAVENOUS | Status: DC | PRN
  Administered 2019-09-23 (×2): 500 mL

## 2019-09-23 MED ORDER — HYDRALAZINE HCL 20 MG/ML IJ SOLN
INTRAMUSCULAR | Status: DC | PRN
  Administered 2019-09-23: 10 mg via INTRAVENOUS

## 2019-09-23 MED ORDER — FENTANYL CITRATE (PF) 100 MCG/2ML IJ SOLN
INTRAMUSCULAR | Status: AC
  Filled 2019-09-23: qty 2

## 2019-09-23 MED ORDER — SODIUM CHLORIDE 0.9 % WEIGHT BASED INFUSION
3.0000 mL/kg/h | INTRAVENOUS | Status: AC
  Administered 2019-09-23: 09:00:00 3 mL/kg/h via INTRAVENOUS

## 2019-09-23 MED ORDER — LABETALOL HCL 5 MG/ML IV SOLN: 10.0000 mg | INTRAVENOUS | Status: DC | PRN

## 2019-09-23 MED ORDER — ASPIRIN 81 MG PO CHEW: 81.0000 mg | CHEWABLE_TABLET | ORAL | Status: DC

## 2019-09-23 MED ORDER — IOHEXOL 350 MG/ML SOLN
INTRAVENOUS | Status: DC | PRN
  Administered 2019-09-23: 45 mL via INTRA_ARTERIAL

## 2019-09-23 MED ORDER — SODIUM CHLORIDE 0.9 % WEIGHT BASED INFUSION: 3.0000 mL/kg/h | INTRAVENOUS | Status: DC

## 2019-09-23 MED ORDER — MIDAZOLAM HCL 2 MG/2ML IJ SOLN
INTRAMUSCULAR | Status: AC
  Filled 2019-09-23: qty 2

## 2019-09-23 MED ORDER — SODIUM CHLORIDE 0.9 % WEIGHT BASED INFUSION: 1.0000 mL/kg/h | INTRAVENOUS | Status: DC

## 2019-09-23 MED ORDER — VERAPAMIL HCL 2.5 MG/ML IV SOLN
INTRAVENOUS | Status: DC | PRN
  Administered 2019-09-23: 10 mL via INTRA_ARTERIAL

## 2019-09-23 MED ORDER — HEPARIN SODIUM (PORCINE) 1000 UNIT/ML IJ SOLN
INTRAMUSCULAR | Status: DC | PRN
  Administered 2019-09-23: 5000 [IU] via INTRAVENOUS

## 2019-09-23 MED ORDER — LIDOCAINE HCL (PF) 1 % IJ SOLN
INTRAMUSCULAR | Status: DC | PRN
  Administered 2019-09-23 (×2): 2 mL via INTRADERMAL

## 2019-09-23 MED ORDER — HYDRALAZINE HCL 20 MG/ML IJ SOLN: 10.0000 mg | INTRAMUSCULAR | Status: DC | PRN

## 2019-09-23 SURGICAL SUPPLY — 14 items
CATH 5FR JL3.5 JR4 ANG PIG MP (CATHETERS) ×2 IMPLANT
CATH BALLN WEDGE 5F 110CM (CATHETERS) ×2 IMPLANT
CATH INFINITI 5FR AL1 (CATHETERS) ×2 IMPLANT
DEVICE RAD COMP TR BAND LRG (VASCULAR PRODUCTS) ×2 IMPLANT
GLIDESHEATH SLEND SS 6F .021 (SHEATH) ×2 IMPLANT
GUIDEWIRE .025 260CM (WIRE) ×2 IMPLANT
GUIDEWIRE INQWIRE 1.5J.035X260 (WIRE) ×1 IMPLANT
INQWIRE 1.5J .035X260CM (WIRE) ×2
KIT HEART LEFT (KITS) ×2 IMPLANT
PACK CARDIAC CATHETERIZATION (CUSTOM PROCEDURE TRAY) ×2 IMPLANT
SHEATH GLIDE SLENDER 4/5FR (SHEATH) ×2 IMPLANT
TRANSDUCER W/STOPCOCK (MISCELLANEOUS) ×2 IMPLANT
TUBING CIL FLEX 10 FLL-RA (TUBING) ×2 IMPLANT
WIRE EMERALD ST .035X150CM (WIRE) IMPLANT

## 2019-09-23 NOTE — Interval H&P Note (Signed)
History and Physical Interval Note:  09/23/2019 8:39 AM  Clinton Holmes  has presented today for surgery, with the diagnosis of aortic stenosis.  The various methods of treatment have been discussed with the patient and family. After consideration of risks, benefits and other options for treatment, the patient has consented to  Procedure(s): RIGHT/LEFT HEART CATH AND CORONARY ANGIOGRAPHY (N/A) as a surgical intervention.  The patient's history has been reviewed, patient examined, no change in status, stable for surgery.  I have reviewed the patient's chart and labs.  Questions were answered to the patient's satisfaction.    Cath Lab Visit (complete for each Cath Lab visit)  Clinical Evaluation Leading to the Procedure:   ACS: No.  Non-ACS:    Anginal Classification: CCS II  Anti-ischemic medical therapy: Minimal Therapy (1 class of medications)  Non-Invasive Test Results: No non-invasive testing performed  Prior CABG: No previous CABG        Lauree Chandler

## 2019-09-23 NOTE — Discharge Instructions (Signed)
Radial Site Care  This sheet gives you information about how to care for yourself after your procedure. Your health care provider may also give you more specific instructions. If you have problems or questions, contact your health care provider. What can I expect after the procedure? After the procedure, it is common to have:  Bruising and tenderness at the catheter insertion area. Follow these instructions at home: Medicines  Take over-the-counter and prescription medicines only as told by your health care provider. Insertion site care  Follow instructions from your health care provider about how to take care of your insertion site. Make sure you: ? Wash your hands with soap and water before you change your bandage (dressing). If soap and water are not available, use hand sanitizer. ? Change your dressing as told by your health care provider. ? Leave stitches (sutures), skin glue, or adhesive strips in place. These skin closures may need to stay in place for 2 weeks or longer. If adhesive strip edges start to loosen and curl up, you may trim the loose edges. Do not remove adhesive strips completely unless your health care provider tells you to do that.  Check your insertion site every day for signs of infection. Check for: ? Redness, swelling, or pain. ? Fluid or blood. ? Pus or a bad smell. ? Warmth.  Do not take baths, swim, or use a hot tub until your health care provider approves.  You may shower 24-48 hours after the procedure, or as directed by your health care provider. ? Remove the dressing and gently wash the site with plain soap and water. ? Pat the area dry with a clean towel. ? Do not rub the site. That could cause bleeding.  Do not apply powder or lotion to the site. Activity   For 24 hours after the procedure, or as directed by your health care provider: ? Do not flex or bend the affected arm. ? Do not push or pull heavy objects with the affected arm. ? Do not  drive yourself home from the hospital or clinic. You may drive 24 hours after the procedure unless your health care provider tells you not to. ? Do not operate machinery or power tools.  Do not lift anything that is heavier than 10 lb (4.5 kg), or the limit that you are told, until your health care provider says that it is safe.  Ask your health care provider when it is okay to: ? Return to work or school. ? Resume usual physical activities or sports. ? Resume sexual activity. General instructions  If the catheter site starts to bleed, raise your arm and put firm pressure on the site. If the bleeding does not stop, get help right away. This is a medical emergency.  If you went home on the same day as your procedure, a responsible adult should be with you for the first 24 hours after you arrive home.  Keep all follow-up visits as told by your health care provider. This is important. Contact a health care provider if:  You have a fever.  You have redness, swelling, or yellow drainage around your insertion site. Get help right away if:  You have unusual pain at the radial site.  The catheter insertion area swells very fast.  The insertion area is bleeding, and the bleeding does not stop when you hold steady pressure on the area.  Your arm or hand becomes pale, cool, tingly, or numb. These symptoms may represent a serious problem   that is an emergency. Do not wait to see if the symptoms will go away. Get medical help right away. Call your local emergency services (911 in the [place] Do not drive yourself to the hospital. Summary  After the procedure, it is common to have bruising and tenderness at the site.  Follow instructions from your health care provider about how to take care of your radial site wound. Check the wound every day for signs of infection.  Do not lift anything that is heavier than 10 lb (4.5 kg), or the limit that you are told, until your health care provider says  that it is safe. This information is not intended to replace advice given to you by your health care provider. Make sure you discuss any questions you have with your health care provider. Document Revised: 09/04/2017 Document Reviewed: 09/04/2017 Elsevier Patient Education  [place] tomorrow on 09/24/19 if no bleeding from either cath site on the right arm.

## 2019-09-25 ENCOUNTER — Telehealth: Payer: Self-pay | Admitting: Cardiology

## 2019-09-25 DIAGNOSIS — I5023 Acute on chronic systolic (congestive) heart failure: Secondary | ICD-10-CM | POA: Diagnosis not present

## 2019-09-25 DIAGNOSIS — I130 Hypertensive heart and chronic kidney disease with heart failure and stage 1 through stage 4 chronic kidney disease, or unspecified chronic kidney disease: Secondary | ICD-10-CM | POA: Diagnosis not present

## 2019-09-25 DIAGNOSIS — N170 Acute kidney failure with tubular necrosis: Secondary | ICD-10-CM | POA: Diagnosis not present

## 2019-09-25 DIAGNOSIS — N1832 Chronic kidney disease, stage 3b: Secondary | ICD-10-CM | POA: Diagnosis not present

## 2019-09-25 DIAGNOSIS — I2510 Atherosclerotic heart disease of native coronary artery without angina pectoris: Secondary | ICD-10-CM | POA: Diagnosis not present

## 2019-09-25 DIAGNOSIS — I0981 Rheumatic heart failure: Secondary | ICD-10-CM | POA: Diagnosis not present

## 2019-09-25 NOTE — Telephone Encounter (Signed)
    Spoke with home health physical therapy regarding patient, Clinton Holmes.  PT arrived at his home or taken vital signs prior to assessment with a BP reading of 202/96.  Patient was asymptomatic without visual changes, headache, nausea or vomiting.  He had no anginal symptoms.  They had him take his p.m. dose of Toprol XL 50 mg with a BP recheck at 196/98.  In speaking with the patient, he reports multiple diet indiscretions with sodium over the last several days.  He was last seen by Dr. Saunders Revel 09/09/2019 with a BP of 102/58.  Heart rate per [facility] PT was 57 bpm.  Per Dr. Saunders Revel note, unable to initiate ARB/ACEI secondary to renal dysfunction.  Unable to titrate beta-blocker secondary to heart rate.  Likely in the setting of recent diet indiscretion.  Patient to monitor BP closely over the next 24 hours.  If he develops symptoms or persistently elevated SBP's greater than 180 mmHg, patient to proceed to an urgent care or ED for further evaluation.   Kathyrn Drown NP-C [facility] Pager: [phone]

## 2019-09-27 DIAGNOSIS — Z7982 Long term (current) use of aspirin: Secondary | ICD-10-CM | POA: Diagnosis not present

## 2019-09-27 DIAGNOSIS — I130 Hypertensive heart and chronic kidney disease with heart failure and stage 1 through stage 4 chronic kidney disease, or unspecified chronic kidney disease: Secondary | ICD-10-CM | POA: Diagnosis not present

## 2019-09-27 DIAGNOSIS — N1832 Chronic kidney disease, stage 3b: Secondary | ICD-10-CM | POA: Diagnosis not present

## 2019-09-27 DIAGNOSIS — Z87891 Personal history of nicotine dependence: Secondary | ICD-10-CM | POA: Diagnosis not present

## 2019-09-27 DIAGNOSIS — I080 Rheumatic disorders of both mitral and aortic valves: Secondary | ICD-10-CM | POA: Diagnosis not present

## 2019-09-27 DIAGNOSIS — I0981 Rheumatic heart failure: Secondary | ICD-10-CM | POA: Diagnosis not present

## 2019-09-27 DIAGNOSIS — N170 Acute kidney failure with tubular necrosis: Secondary | ICD-10-CM | POA: Diagnosis not present

## 2019-09-27 DIAGNOSIS — M810 Age-related osteoporosis without current pathological fracture: Secondary | ICD-10-CM | POA: Diagnosis not present

## 2019-09-27 DIAGNOSIS — M1990 Unspecified osteoarthritis, unspecified site: Secondary | ICD-10-CM | POA: Diagnosis not present

## 2019-09-27 DIAGNOSIS — I4891 Unspecified atrial fibrillation: Secondary | ICD-10-CM | POA: Diagnosis not present

## 2019-09-27 DIAGNOSIS — I5023 Acute on chronic systolic (congestive) heart failure: Secondary | ICD-10-CM | POA: Diagnosis not present

## 2019-09-27 DIAGNOSIS — I2510 Atherosclerotic heart disease of native coronary artery without angina pectoris: Secondary | ICD-10-CM | POA: Diagnosis not present

## 2019-09-27 DIAGNOSIS — I714 Abdominal aortic aneurysm, without rupture: Secondary | ICD-10-CM | POA: Diagnosis not present

## 2019-09-27 DIAGNOSIS — K529 Noninfective gastroenteritis and colitis, unspecified: Secondary | ICD-10-CM | POA: Diagnosis not present

## 2019-09-27 DIAGNOSIS — Z7901 Long term (current) use of anticoagulants: Secondary | ICD-10-CM | POA: Diagnosis not present

## 2019-09-29 DIAGNOSIS — N170 Acute kidney failure with tubular necrosis: Secondary | ICD-10-CM | POA: Diagnosis not present

## 2019-09-29 DIAGNOSIS — I130 Hypertensive heart and chronic kidney disease with heart failure and stage 1 through stage 4 chronic kidney disease, or unspecified chronic kidney disease: Secondary | ICD-10-CM | POA: Diagnosis not present

## 2019-09-29 DIAGNOSIS — I0981 Rheumatic heart failure: Secondary | ICD-10-CM | POA: Diagnosis not present

## 2019-09-29 DIAGNOSIS — I2510 Atherosclerotic heart disease of native coronary artery without angina pectoris: Secondary | ICD-10-CM | POA: Diagnosis not present

## 2019-09-29 DIAGNOSIS — I5023 Acute on chronic systolic (congestive) heart failure: Secondary | ICD-10-CM | POA: Diagnosis not present

## 2019-09-29 DIAGNOSIS — N1832 Chronic kidney disease, stage 3b: Secondary | ICD-10-CM | POA: Diagnosis not present

## 2019-09-30 DIAGNOSIS — I5023 Acute on chronic systolic (congestive) heart failure: Secondary | ICD-10-CM | POA: Diagnosis not present

## 2019-09-30 DIAGNOSIS — I0981 Rheumatic heart failure: Secondary | ICD-10-CM | POA: Diagnosis not present

## 2019-09-30 DIAGNOSIS — I2510 Atherosclerotic heart disease of native coronary artery without angina pectoris: Secondary | ICD-10-CM | POA: Diagnosis not present

## 2019-09-30 DIAGNOSIS — I130 Hypertensive heart and chronic kidney disease with heart failure and stage 1 through stage 4 chronic kidney disease, or unspecified chronic kidney disease: Secondary | ICD-10-CM | POA: Diagnosis not present

## 2019-09-30 DIAGNOSIS — N170 Acute kidney failure with tubular necrosis: Secondary | ICD-10-CM | POA: Diagnosis not present

## 2019-09-30 DIAGNOSIS — N1832 Chronic kidney disease, stage 3b: Secondary | ICD-10-CM | POA: Diagnosis not present

## 2019-10-02 ENCOUNTER — Encounter (HOSPITAL_COMMUNITY)
Admission: RE | Admit: 2019-10-02 | Discharge: 2019-10-02 | Disposition: A | Discharge status: Home / Self Care | Payer: Medicare Other | Source: Ambulatory Visit | Attending: Cardiovascular Disease | Admitting: Cardiovascular Disease

## 2019-10-02 ENCOUNTER — Telehealth: Payer: Self-pay | Admitting: Cardiovascular Disease

## 2019-10-02 ENCOUNTER — Ambulatory Visit (HOSPITAL_COMMUNITY)
Admission: RE | Admit: 2019-10-02 | Discharge: 2019-10-02 | Disposition: A | Discharge status: Home / Self Care | Payer: Medicare Other | Source: Ambulatory Visit | Attending: Physician Assistant | Admitting: Physician Assistant

## 2019-10-02 ENCOUNTER — Other Ambulatory Visit: Payer: Self-pay

## 2019-10-02 ENCOUNTER — Ambulatory Visit (HOSPITAL_BASED_OUTPATIENT_CLINIC_OR_DEPARTMENT_OTHER)
Admission: RE | Admit: 2019-10-02 | Discharge: 2019-10-02 | Disposition: A | Discharge status: Home / Self Care | Payer: Medicare Other | Source: Ambulatory Visit | Attending: Physician Assistant | Admitting: Physician Assistant

## 2019-10-02 DIAGNOSIS — I350 Nonrheumatic aortic (valve) stenosis: Secondary | ICD-10-CM | POA: Insufficient documentation

## 2019-10-02 DIAGNOSIS — I714 Abdominal aortic aneurysm, without rupture: Secondary | ICD-10-CM | POA: Diagnosis not present

## 2019-10-02 LAB — BASIC METABOLIC PANEL
Anion gap: 7 (ref 5–15)
BUN: 30 mg/dL — ABNORMAL HIGH (ref 8–23)
CO2: 27 mmol/L (ref 22–32)
Calcium: 9.8 mg/dL (ref 8.9–10.3)
Chloride: 104 mmol/L (ref 98–111)
Creatinine, Ser: 1.46 mg/dL — ABNORMAL HIGH (ref 0.61–1.24)
GFR calc Af Amer: 48 mL/min — ABNORMAL LOW (ref >60)
GFR calc non Af Amer: 42 mL/min — ABNORMAL LOW (ref >60)
Glucose, Bld: 87 mg/dL (ref 70–99)
Potassium: 4 mmol/L (ref 3.5–5.1)
Sodium: 138 mmol/L (ref 135–145)

## 2019-10-02 MED ORDER — IOHEXOL 350 MG/ML SOLN
80.0000 mL | Freq: Once | INTRAVENOUS | Status: AC | PRN
  Administered 2019-10-02: 80 mL via INTRAVENOUS

## 2019-10-02 MED ORDER — SODIUM CHLORIDE 0.9 % WEIGHT BASED INFUSION: 1.0000 mL/kg/h | INTRAVENOUS | Status: DC

## 2019-10-02 MED ORDER — SODIUM CHLORIDE 0.9 % WEIGHT BASED INFUSION
3.0000 mL/kg/h | INTRAVENOUS | Status: AC
  Administered 2019-10-02: 3 mL/kg/h via INTRAVENOUS

## 2019-10-02 NOTE — Progress Notes (Signed)
Carotid artery duplex completed. Refer to "CV Proc" under chart review to view preliminary results.  10/02/2019 8:25 AM Kelby Aline., MHA, RVT, RDCS, RDMS

## 2019-10-02 NOTE — Telephone Encounter (Signed)
Nurse with Home health states patient is not available to day and needs orders to reschedule to next week

## 2019-10-02 NOTE — Telephone Encounter (Signed)
Returned the call to Peter(PT) @ [facility]. Dr. Rockey Situ has previously approved Home health PT for the patient. Verbal order given to reschedule the patients home PT for next week.

## 2019-10-05 DIAGNOSIS — N1832 Chronic kidney disease, stage 3b: Secondary | ICD-10-CM | POA: Diagnosis not present

## 2019-10-05 DIAGNOSIS — I0981 Rheumatic heart failure: Secondary | ICD-10-CM | POA: Diagnosis not present

## 2019-10-05 DIAGNOSIS — I130 Hypertensive heart and chronic kidney disease with heart failure and stage 1 through stage 4 chronic kidney disease, or unspecified chronic kidney disease: Secondary | ICD-10-CM | POA: Diagnosis not present

## 2019-10-05 DIAGNOSIS — I5023 Acute on chronic systolic (congestive) heart failure: Secondary | ICD-10-CM | POA: Diagnosis not present

## 2019-10-05 DIAGNOSIS — I2510 Atherosclerotic heart disease of native coronary artery without angina pectoris: Secondary | ICD-10-CM | POA: Diagnosis not present

## 2019-10-05 DIAGNOSIS — N170 Acute kidney failure with tubular necrosis: Secondary | ICD-10-CM | POA: Diagnosis not present

## 2019-10-08 DIAGNOSIS — I2510 Atherosclerotic heart disease of native coronary artery without angina pectoris: Secondary | ICD-10-CM | POA: Diagnosis not present

## 2019-10-08 DIAGNOSIS — I5023 Acute on chronic systolic (congestive) heart failure: Secondary | ICD-10-CM | POA: Diagnosis not present

## 2019-10-08 DIAGNOSIS — I130 Hypertensive heart and chronic kidney disease with heart failure and stage 1 through stage 4 chronic kidney disease, or unspecified chronic kidney disease: Secondary | ICD-10-CM | POA: Diagnosis not present

## 2019-10-08 DIAGNOSIS — N1832 Chronic kidney disease, stage 3b: Secondary | ICD-10-CM | POA: Diagnosis not present

## 2019-10-08 DIAGNOSIS — N170 Acute kidney failure with tubular necrosis: Secondary | ICD-10-CM | POA: Diagnosis not present

## 2019-10-08 DIAGNOSIS — I0981 Rheumatic heart failure: Secondary | ICD-10-CM | POA: Diagnosis not present

## 2019-10-12 ENCOUNTER — Encounter: Payer: Medicare Other | Admitting: Thoracic Surgery (Cardiothoracic Vascular Surgery)

## 2019-10-12 DIAGNOSIS — I130 Hypertensive heart and chronic kidney disease with heart failure and stage 1 through stage 4 chronic kidney disease, or unspecified chronic kidney disease: Secondary | ICD-10-CM | POA: Diagnosis not present

## 2019-10-12 DIAGNOSIS — N170 Acute kidney failure with tubular necrosis: Secondary | ICD-10-CM | POA: Diagnosis not present

## 2019-10-12 DIAGNOSIS — N1832 Chronic kidney disease, stage 3b: Secondary | ICD-10-CM | POA: Diagnosis not present

## 2019-10-12 DIAGNOSIS — I5023 Acute on chronic systolic (congestive) heart failure: Secondary | ICD-10-CM | POA: Diagnosis not present

## 2019-10-12 DIAGNOSIS — I2510 Atherosclerotic heart disease of native coronary artery without angina pectoris: Secondary | ICD-10-CM | POA: Diagnosis not present

## 2019-10-12 DIAGNOSIS — I0981 Rheumatic heart failure: Secondary | ICD-10-CM | POA: Diagnosis not present

## 2019-10-14 ENCOUNTER — Other Ambulatory Visit: Payer: Self-pay

## 2019-10-14 ENCOUNTER — Ambulatory Visit: Payer: Medicare Other | Admitting: Nurse Practitioner

## 2019-10-14 ENCOUNTER — Institutional Professional Consult (permissible substitution) (INDEPENDENT_AMBULATORY_CARE_PROVIDER_SITE_OTHER): Payer: Medicare Other | Admitting: Surgery

## 2019-10-14 ENCOUNTER — Encounter: Payer: Self-pay | Admitting: Surgery

## 2019-10-14 VITALS — BP 146/70 | HR 53 | Temp 97.5°F | Resp 20 | Ht 72.0 in | Wt 234.0 lb

## 2019-10-14 DIAGNOSIS — I350 Nonrheumatic aortic (valve) stenosis: Secondary | ICD-10-CM | POA: Diagnosis not present

## 2019-10-14 DIAGNOSIS — I2510 Atherosclerotic heart disease of native coronary artery without angina pectoris: Secondary | ICD-10-CM

## 2019-10-14 NOTE — Progress Notes (Signed)
[facility]  MULTIDISCIPLINARY HEART VALVE CLINIC  CARDIOTHORACIC SURGERY CONSULTATION REPORT  Referring Provider is Jerline Pain, MD Primary Cardiologist is Candee Furbish, MD PCP is Hamrick, Lorin Mercy, MD  Chief Complaint  Patient presents with  . Aortic Stenosis    Surgical consultation    HPI:  The patient is a 86 year old gentleman with a history of hypertension, atrial fibrillation on Eliquis, stage III chronic kidney disease, nonobstructive coronary disease, PVCs, ischemic cardiomyopathy, and aortic stenosis who is being evaluated for consideration of transcatheter aortic valve placement.  He was admitted to [facility] in January 2021 with atrial fibrillation with rapid ventricular response as well as nausea and vomiting.  An echocardiogram showed an ejection fraction of 25 to 30%.  The aortic valve was calcified with restricted mobility.  The mean gradient across the valve was 22 mmHg with a peak gradient of 41 mmHg.  Aortic valve area was 0.65 cm with a dimensionless index of 0.14.  This was felt to represent low flow, low gradient, severe aortic stenosis.  He underwent TEE guided cardioversion and was started on Eliquis.  He subsequently underwent cardiac catheterization on 09/23/2019 showing mild to moderate nonobstructive coronary disease in the proximal and mid LAD as well as the ramus branch.  Right heart cath showed normal pulmonary artery pressures with a mean wedge pressure of 11 mmHg.  CVP was normal.  Cardiac index was 2.47.  He is here today with one of his daughters.  He is in a wheelchair and said that he walks with a walker due to balance problems since he had an acoustic neuroma of the left ear treated about 15 years ago.  He has had multiple falls in the past but none for the last several years.  His daughter said that he is not very active at home mainly walking in his house and watching TV.  He denies any shortness of breath or chest pain.  He does  report some fatigue and his daughter feels that he is not as active as he could be.  He denies any dizziness but does have some vertigo.  He lives alone but has 3 children that help him out.  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    Past Medical History:  Diagnosis Date  . CAD (coronary artery disease)    L&RHC 12/23/2014 elevated LVEDP, prox LAD 45%, 60% ramus  . CKD (chronic kidney disease), stage III   . Essential hypertension   . Ischemic cardiomyopathy   . Murmur   . OA (osteoarthritis)    a. 2011 s/p bilat TKA.  . Severe aortic stenosis   . Ventricular bigeminy    a. 12/2014. Started on amio on 12/24/2014    Past Surgical History:  Procedure Laterality Date  . CARDIAC CATHETERIZATION N/A 12/23/2014   Procedure: Right/Left Heart Cath and Coronary Angiography;  Surgeon: Belva Crome, MD;  Location: [place] [facility];  Service: Cardiovascular;  Laterality: N/A;  . CARDIOVERSION N/A 08/19/2019   Procedure: CARDIOVERSION;  Surgeon: Minna Merritts, MD;  Location: [facility] OR[facility];  Service: Cardiovascular;  Laterality: N/A;  . KNEE SURGERY    . RIGHT/LEFT HEART CATH AND CORONARY ANGIOGRAPHY N/A 09/23/2019   Procedure: RIGHT/LEFT HEART CATH AND CORONARY ANGIOGRAPHY;  Surgeon: Burnell Blanks, MD;  Location: [place] [facility];  Service: Cardiovascular;  Laterality: N/A;  . SHOULDER SURGERY Left   . TEE WITHOUT CARDIOVERSION  N/A 08/19/2019   Procedure: TRANSESOPHAGEAL ECHOCARDIOGRAM (TEE);  Surgeon: Minna Merritts, MD;  Location: [facility] OR[facility];  Service: Cardiovascular;  Laterality: N/A;  . TONSILLECTOMY      Family History  Problem Relation Age of Onset  . Heart attack Father        deceased @ 68.  . Congestive Heart Failure Sister        alive in her late 59's.  . Other Brother        alive @ 64.  Marland Kitchen CAD Mother        H/o CABG, deceased @ 18.    Social History   Socioeconomic History  . Marital status: Divorced    Spouse  name: Not on file  . Number of children: 5  . Years of education: Not on file  . Highest education level: Not on file  Occupational History  . Occupation: Retired-Farmer  Tobacco Use  . Smoking status: Former Research scientist (life sciences)  . Smokeless tobacco: Never Used  . Tobacco comment: Smoked for a few yrs in the service.  Quit in 1968. Never a heavy smoker.  Substance and Sexual Activity  . Alcohol use: No    Alcohol/week: 0.0 standard drinks  . Drug use: No  . Sexual activity: Not on file  Other Topics Concern  . Not on file  Social History Narrative   Lives between [place] and [place] by himself.  He does not routinely exercise.   Social Determinants of Health   Financial Resource Strain:   . Difficulty of Paying Living Expenses: Not on file  Food Insecurity:   . Worried About Charity fundraiser in the Last Year: Not on file  . Ran Out of Food in the Last Year: Not on file  Transportation Needs:   . Lack of Transportation (Medical): Not on file  . Lack of Transportation (Non-Medical): Not on file  Physical Activity:   . Days of Exercise per Week: Not on file  . Minutes of Exercise per Session: Not on file  Stress:   . Feeling of Stress : Not on file  Social Connections:   . Frequency of Communication with Friends and Family: Not on file  . Frequency of Social Gatherings with Friends and Family: Not on file  . Attends Religious Services: Not on file  . Active Member of Clubs or Organizations: Not on file  . Attends Archivist Meetings: Not on file  . Marital Status: Not on file  Intimate Partner Violence:   . Fear of Current or Ex-Partner: Not on file  . Emotionally Abused: Not on file  . Physically Abused: Not on file  . Sexually Abused: Not on file    Current Outpatient Medications  Medication Sig Dispense Refill  . apixaban (ELIQUIS) 2.5 MG TABS tablet Take 1 tablet (2.5 mg total) by mouth 2 (two) times daily. 60 tablet 6  . aspirin EC 81 MG EC tablet Take 1 tablet  (81 mg total) by mouth daily.    . furosemide (LASIX) 40 MG tablet Take 1 tablet (40 mg total) by mouth daily. 60 tablet 6  . metoprolol succinate (TOPROL-XL) 50 MG 24 hr tablet Take 1 tablet (50 mg total) by mouth 2 (two) times daily. Take with or immediately following a meal. 60 tablet 6  . Multiple Vitamins-Minerals (MULTIVITAMIN ADULTS 50+ PO) Take 1 tablet by mouth daily.     No current facility-administered medications for this visit.    No Known Allergies  Review of Systems:   General:  normal appetite, + decreased energy, no weight gain, no weight loss, no fever  Cardiac:  no chest pain with exertion, no chest pain at rest, no SOB with  exertion, no resting SOB, no PND, no orthopnea, + palpitations, + arrhythmia, + atrial fibrillation, + LE edema, no dizzy spells, no syncope  Respiratory:  no shortness of breath, no home oxygen, no productive cough, no dry cough, no bronchitis, no wheezing, no hemoptysis, no asthma, no pain with inspiration or cough, no sleep apnea, no CPAP at night  GI:   no difficulty swallowing, no reflux, no frequent heartburn, no hiatal hernia, no abdominal pain, no constipation, no diarrhea, no hematochezia, no hematemesis, no melena  GU:   no dysuria,  + frequency, no urinary tract infection, no hematuria, no enlarged prostate, no kidney stones, + kidney disease  Vascular:  no pain suggestive of claudication, no pain in feet, no leg cramps, no varicose veins, no DVT, no non-healing foot ulcer  Neuro:   no stroke, + TIA's, no seizures, no headaches, no temporary blindness one eye,  no slurred speech, + peripheral neuropathy in hands, no chronic pain, + instability of gait, no memory/cognitive dysfunction  Musculoskeletal: + arthritis, no joint swelling, no myalgias, + difficulty walking, + reduced mobility   Skin:   no rash, no itching, no skin infections, no pressure sores or ulcerations  Psych:   no anxiety, no depression, no nervousness, no unusual recent  stress  Eyes:   no blurry vision, no floaters, no recent vision changes, + wears glasses or contacts, blind left eye.  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 of acoustic neuroma, no loose or painful teeth, no dentures, last saw dentist 04/14/19  Hematologic:  no easy bruising, no abnormal bleeding, no clotting disorder, no frequent epistaxis  Endocrine:  no diabetes, does not check CBG's at home    Physical Exam:   BP (!) 146/70 (BP Location: Left Arm, Patient Position: Sitting, Cuff Size: Normal)   Pulse (!) 53   Temp (!) 97.5 F (36.4 C) (Temporal)   Resp 20   Ht 6' (1.829 m)   Wt 234 lb (106.1 kg)   SpO2 96% Comment: RA  BMI 31.74 kg/m   General:  Elderly, well-appearing but in wheel chair  HEENT:  Unremarkable, NCAT, PERLA, EOMI, teeth in fair condition  Neck:   no JVD, no bruits, no adenopathy   Chest:   clear to auscultation, symmetrical breath sounds, no wheezes, no rhonchi   CV:   RRR, grade lll/VI crescendo/decrescendo murmur heard best at RSB,  no diastolic murmur  Abdomen:  soft, non-tender, no masses   Extremities:  warm, well-perfused, pulses palpable at ankle, trace LE edema  Rectal/GU  Deferred  Neuro:   Grossly non-focal and symmetrical throughout  Skin:   Clean and dry, no rashes, no breakdown   Diagnostic Tests:  ECHOCARDIOGRAM REPORT       Patient Name:  Clinton Holmes Date of Exam: 08/14/2019  Medical Rec #: [id number]    Height:    72.0 in  Accession #:  [id number]   Weight:    241.0 lb  Date of Birth: 1928-04-10    BSA:     2.31 m  Patient Age:  30 years    BP:      117/96 mmHg  Patient Gender: M        HR:      100 bpm.  Exam Location: [facility]   Procedure: 2D Echo, Color Doppler and Cardiac Doppler   Indications:   I48.91 Atrial fibrillation    History:     Patient has prior history of Echocardiogram examinations.  CAD,          CKD; Risk Factors:Hypertension.      Sonographer:   Charmayne Sheer RDCS (AE)  Referring Phys: [id number] Arvella Merles MANSY  Diagnosing Phys: Kate Sable MD   IMPRESSIONS    1. Left ventricular ejection fraction, by visual estimation, is 25 to  30%. The left ventricle has severely decreased function. There is mildly  increased left ventricular hypertrophy.  2. Left ventricular diastolic parameters are indeterminate.  3. The left ventricle demonstrates global hypokinesis.  4. Global right ventricle has mildly reduced systolic function.The right  ventricular size is normal. No increase in right ventricular wall  thickness.  5. Left atrial size was mildly dilated.  6. Right atrial size was normal.  7. The mitral valve is grossly normal. Trivial mitral valve  regurgitation.  8. The tricuspid valve is normal in structure.  9. Aortic valve area, by VTI measures 0.65 cm.  10. Aortic valve mean gradient measures 22.0 mmHg.  11. Aortic valve peak gradient measures 41.5 mmHg.  12. The aortic valve is abnormal. Aortic valve regurgitation is not  visualized. Severe aortic valve stenosis.  13. The pulmonic valve was not well visualized. Pulmonic valve  regurgitation is not visualized.  14. Moderately elevated pulmonary artery systolic pressure.  15. The inferior vena cava is dilated in size with <50% respiratory  variability, suggesting right atrial pressure of 15 mmHg.   FINDINGS  Left Ventricle: Left ventricular ejection fraction, by visual estimation,  is 25 to 30%. The left ventricle has severely decreased function. The left  ventricle demonstrates global hypokinesis. The left ventricular internal  cavity size was the left  ventricle is normal in size. There is mildly increased left ventricular  hypertrophy. Left ventricular diastolic parameters are indeterminate.   Right Ventricle: The right ventricular size is normal. No increase in  right ventricular wall thickness. Global RV systolic function is has  mildly  reduced systolic function. The tricuspid regurgitant velocity is  2.82 m/s, and with an assumed right atrial  pressure of 15 mmHg, the estimated right ventricular systolic pressure is  moderately elevated at 46.8 mmHg.   Left Atrium: Left atrial size was mildly dilated.   Right Atrium: Right atrial size was normal in size   Pericardium: There is no evidence of pericardial effusion.   Mitral Valve: The mitral valve is grossly normal. Trivial mitral valve  regurgitation. MV peak gradient, 5.7 mmHg.   Tricuspid Valve: The tricuspid valve is normal in structure. Tricuspid  valve regurgitation is not demonstrated.   Aortic Valve: The aortic valve is abnormal. Aortic valve regurgitation is  not visualized. Severe aortic stenosis is present. Aortic valve mean  gradient measures 22.0 mmHg. Aortic valve peak gradient measures 41.5  mmHg. Aortic valve area, by VTI measures  0.65 cm. Aortic valve DVI is 0.14. Low flow low gradient severe aortic  stenosis.   Pulmonic Valve: The pulmonic valve was not well visualized. Pulmonic valve  regurgitation is not visualized. Pulmonic regurgitation is not visualized.   Aorta: The aortic root is normal in size and structure.   Venous: The inferior vena cava is dilated in size with less than 50%  respiratory variability, suggesting right atrial pressure of 15 mmHg.   IAS/Shunts: No atrial level shunt detected by color  flow Doppler.     LEFT VENTRICLE  PLAX 2D  LVIDd:     5.91 cm Diastology  LVIDs:     4.48 cm LV e' lateral:  12.90 cm/s  LV PW:     1.17 cm LV E/e' lateral: 7.3  LV IVS:    1.29 cm LV e' medial:  6.53 cm/s  LVOT diam:   2.30 cm LV E/e' medial: 14.4  LV SV:     82 ml  LV SV Index:  34.50  LVOT Area:   4.15 cm     RIGHT VENTRICLE  RV Basal diam: 4.74 cm   LEFT ATRIUM       Index    RIGHT ATRIUM      Index  LA diam:    4.30 cm 1.86 cm/m RA Area:   25.40 cm  LA Vol  (A2C):  89.6 ml 38.85 ml/m RA Volume:  72.40 ml 31.39 ml/m  LA Vol (A4C):  87.1 ml 37.77 ml/m  LA Biplane Vol: 90.5 ml 39.24 ml/m  AORTIC VALVE          PULMONIC VALVE  AV Area (Vmax):  0.82 cm   PV Vmax:    1.19 m/s  AV Area (Vmean):  0.66 cm   PV Vmean:   79.800 cm/s  AV Area (VTI):   0.65 cm   PV VTI:    0.213 m  AV Vmax:      322.00 cm/s PV Peak grad: 5.7 mmHg  AV Vmean:     215.000 cm/s PV Mean grad: 3.0 mmHg  AV VTI:      0.592 m  AV Peak Grad:   41.5 mmHg  AV Mean Grad:   22.0 mmHg  LVOT Vmax:     63.50 cm/s  LVOT Vmean:    34.000 cm/s  LVOT VTI:     0.093 m  LVOT/AV VTI ratio: 0.16    AORTA  Ao Root diam: 3.50 cm   MITRAL VALVE            TRICUSPID VALVE  MV Area (PHT): 4.83 cm      TR Peak grad:  31.8 mmHg  MV Peak grad: 5.7 mmHg      TR Vmax:    287.00 cm/s  MV Mean grad: 2.0 mmHg  MV Vmax:    1.19 m/s      SHUNTS  MV Vmean:   67.3 cm/s      Systemic VTI: 0.09 m  MV VTI:    0.22 m       Systemic Diam: 2.30 cm  MV PHT:    45.53 msec  MV Decel Time: 157 msec  MV E velocity: 93.80 cm/s 103 cm/s     Kate Sable MD  Electronically signed by Kate Sable MD  Signature Date/Time: 08/14/2019/2:01:47 PM     TRANSESOPHOGEAL ECHO REPORT       Patient Name:  [name] Date of Exam: 08/19/2019  Medical Rec #: [id number]    Height:    72.0 in  Accession #:  [id number]   Weight:    289.0 lb  Date of Birth: [birth year]    BSA:     2.49 m  Patient Age:  56 years    BP:      132/97 mmHg  Patient Gender: M        HR:      114 bpm.  Exam Location: [facility]     Procedure: Color Doppler, Cardiac Doppler, Saline Contrast Bubble  Study  and       Transesophageal Echo   Indications:   I48.91 Atrial fibrillation    History:     Patient has prior history of  Echocardiogram examinations,  most          recent 08/14/2019.    Sonographer:   Charmayne Sheer RDCS (AE)  Referring Phys: [id number] Rise Mu  Diagnosing Phys: Ida Rogue MD      PROCEDURE: Consent was requested emergently by emergency room physicain.  Local oropharyngeal anesthetic was provided with Benzocaine spray and  viscous lidocaine. The transesophogeal probe was passed through the  esophogus of the patient. Image quality was  excellent. The patient's vital signs; including heart rate, blood  pressure, and oxygen saturation; remained stable throughout the procedure.  The patient developed no complications during the procedure.   IMPRESSIONS    1. Left ventricular ejection fraction, by visual estimation, is 25 to  30%. The left ventricle has severely decreased function. There is no left  ventricular hypertrophy.  2. The left ventricle demonstrates global hypokinesis.  3. Global right ventricle has moderately reduced systolic function.The  right ventricular size is mildly enlarged. No increase in right  ventricular wall thickness.  4. Left atrial size was moderately dilated.  5. Right atrial size was moderately dilated.  6. Severe aortic valve stenosis.Successful cardioversion after TEE for  persistent atrial fibrillation. NSR was restored.   FINDINGS  Left Ventricle: Left ventricular ejection fraction, by visual estimation,  is 25 to 30%. The left ventricle has severely decreased function. The left  ventricle demonstrates global hypokinesis. There is no left ventricular  hypertrophy. Normal left atrial  pressure.   Right Ventricle: The right ventricular size is mildly enlarged. No  increase in right ventricular wall thickness. Global RV systolic function  is has moderately reduced systolic function.   Left Atrium: Left atrial size was moderately dilated.   Right Atrium: Right atrial size was moderately dilated   Pericardium: There is no  evidence of pericardial effusion.   Mitral Valve: The mitral valve is normal in structure. No evidence of  mitral valve stenosis by observation. Mild to moderate mitral valve  regurgitation.   Tricuspid Valve: The tricuspid valve is normal in structure. Tricuspid  valve regurgitation is mild.   Aortic Valve: The aortic valve is normal in structure. Aortic valve  regurgitation is not visualized. Severe aortic stenosis is present.   Pulmonic Valve: The pulmonic valve was normal in structure. Pulmonic valve  regurgitation is not visualized.   Aorta: The aortic root, ascending aorta and aortic arch are all  structurally normal, with no evidence of dilitation or obstruction.   Venous: The inferior vena cava is normal in size with greater than 50%  respiratory variability, suggesting right atrial pressure of 3 mmHg.   Shunts: Agitated saline contrast was given intravenously to evaluate for  intracardiac shunting. Saline contrast bubble study was negative, with no  evidence of any interatrial shunt. There is no evidence of a patent  foramen ovale. No ventricular septal  defect is seen or detected. There is no evidence of an atrial septal  defect. No atrial level shunt detected by color flow Doppler.     Ida Rogue MD  Electronically signed by Ida Rogue MD  Signature Date/Time: 08/19/2019/6:36:03 PM     Physicians Panel Physicians Referring Physician Case Authorizing Physician  Burnell Blanks, MD (Primary)       Procedures RIGHT/LEFT HEART CATH AND CORONARY ANGIOGRAPHY  Conclusion Prox RCA to Mid RCA lesion is 20% stenosed.  Ramus lesion is 50% stenosed.  Prox LAD to Mid LAD lesion is 40% stenosed. 1. Mild to moderate non-obstructive disease in the proximal and mid LAD, mid intermediate branch.  2. Mild disease in the mid segment of the large dominant RCA  3. Severe aortic stenosis by echo (cath data: mean gradient 25.7 mmHg, peak to peak gradient 20  mmHg)  Recommendations: Medical management of mild CAD. Continue workup for TAVR.      Recommendations Antiplatelet/Anticoag Medical management of mild CAD. Continue workup for TAVR.        Indications Severe aortic stenosis [I35.0 (ICD-10-CM)]     Procedural Details Technical Details Indication: 86 yo male with severe AS, known mild CAD, workup for TAVR  Procedure: The risks, benefits, complications, treatment options, and expected outcomes were discussed with the patient. The patient and/or family concurred with the proposed plan, giving informed consent. The patient was brought to the cath lab after IV hydration was given. The patient was sedated with Versed and Fentanyl. The IV catheter present in the right antecubital vein was changed for a 5 [place] sheath. Right heart catheterization performed with a balloon tipped catheter. The right wrist was prepped and draped in a sterile fashion. 1% lidocaine was used for local anesthesia. Using the modified Seldinger access technique, a 5 French sheath was placed in the right radial artery. 3 mg Verapamil was given through the sheath. 5000 units IV heparin was given. Standard diagnostic catheters were used to perform selective coronary angiography. The aortic valve was crossed with an AL-1 and the J wire. LV pressures measured. No LV gram performed. The sheath was removed from the right radial artery and a Terumo hemostasis band was applied at the arteriotomy site on the right wrist.    Estimated blood loss <50 mL.   During this procedure medications were administered to achieve and maintain moderate conscious sedation while the patient's heart rate, blood pressure, and oxygen saturation were continuously monitored and I was present face-to-face 100% of this time.     Medications (Filter: Administrations occurring from 09/23/19 1316 to 09/23/19 1421)  Heparin (Porcine) in NaCl 1000-0.9 UT/500ML-% SOLN (mL) Total volume: 1,000 mL  Date/Time    Rate/Dose/Volume Action  09/23/19 1336  500 mL Given  1336  500 mL Given  midazolam (VERSED) injection (mg) Total dose: 1 mg  Date/Time   Rate/Dose/Volume Action  09/23/19 1336  1 mg Given  fentaNYL (SUBLIMAZE) injection (mcg) Total dose: 25 mcg  Date/Time   Rate/Dose/Volume Action  09/23/19 1337  25 mcg Given  lidocaine (PF) (XYLOCAINE) 1 % injection (mL) Total volume: 4 mL  Date/Time   Rate/Dose/Volume Action  09/23/19 1346  2 mL Given  1346  2 mL Given  Radial Cocktail/Verapamil only (mL) Total volume: 10 mL  Date/Time   Rate/Dose/Volume Action  09/23/19 1350  10 mL Given  heparin injection (Units) Total dose: 5,000 Units  Date/Time   Rate/Dose/Volume Action  09/23/19 1355  5,000 Units Given  hydrALAZINE (APRESOLINE) injection (mg) Total dose: 10 mg  Date/Time   Rate/Dose/Volume Action  09/23/19 1413  10 mg Given  iohexol (OMNIPAQUE) 350 MG/ML injection (mL) Total volume: 45 mL  Date/Time   Rate/Dose/Volume Action  09/23/19 1413  45 mL Given     Sedation Time Sedation Time Physician-1: 30 minutes 47 seconds        Contrast Medication Name Total Dose  iohexol (OMNIPAQUE) 350 MG/ML injection 45 mL  Radiation/Fluoro Fluoro time: 5.3 (min)  DAP: 17013 (mGycm2)  Cumulative Air Kerma: [id number] (mGy)     Complications Complications documented before study signed (09/23/2019 2:21 PM)  RIGHT/LEFT HEART CATH AND CORONARY ANGIOGRAPHY  None Documented by Burnell Blanks, MD 09/23/2019 2:18 PM  Date Found: 09/23/2019  Time Range: Intraprocedure       Coronary Findings Diagnostic Dominance: Right  Left Anterior Descending  Vessel is large.  Prox LAD to Mid LAD lesion 40% stenosed  Prox LAD to Mid LAD lesion is 40% stenosed.   Ramus Intermedius  Vessel is moderate in size.  Ramus lesion 50% stenosed  Ramus lesion is 50% stenosed.   Right Coronary Artery  Vessel is large.  Prox RCA to Mid RCA lesion 20% stenosed  Prox RCA to Mid RCA lesion is  20% stenosed.  Intervention No interventions have been documented.                            Coronary Diagrams Diagnostic Dominance: Right  &&&&&  Intervention      Implants  No implant documentation for this case.      [employer] Images Link to Procedure Log  Show images for CARDIAC CATHETERIZATION Procedure Log     Images on Long Term Storage   Show images for Reznor, Banka         Hemo Data   Most Recent Value  Fick Cardiac Output 5.62 L/min  Fick Cardiac Output Index 2.47 (L/min)/BSA  RA A Wave 10 mmHg  RA V Wave 13 mmHg  RA Mean 4 mmHg  RV Systolic Pressure 38 mmHg  RV Diastolic Pressure 2 mmHg  RV EDP 5 mmHg  PA Systolic Pressure 38 mmHg  PA Diastolic Pressure 9 mmHg  PA Mean 22 mmHg  PW A Wave 18 mmHg  PW V Wave 16 mmHg  PW Mean 11 mmHg  AO Systolic Pressure [id number] mmHg  AO Diastolic Pressure 47 mmHg  AO Mean 77 mmHg  LV Systolic Pressure Q000111Q mmHg  LV Diastolic Pressure 12 mmHg  LV EDP 21 mmHg  AOp Systolic Pressure [id number] mmHg  AOp Diastolic Pressure 64 mmHg  AOp Mean Pressure 99 mmHg  LVp Systolic Pressure 0000000 mmHg  LVp Diastolic Pressure 14 mmHg  LVp EDP Pressure 21 mmHg  QP/QS 1  TPVR Index 8.91 HRUI  TSVR Index 32.8 HRUI  PVR SVR Ratio 0.14  TPVR/TSVR Ratio 0.27    ADDENDUM REPORT: 10/02/2019 20:02  CLINICAL DATA:  86 year old male with severe aortic stenosis being evaluated for a TAVR procedure.  EXAM: Cardiac TAVR CT  TECHNIQUE: The patient was scanned on a [employer]. A 120 kV retrospective scan was triggered in the descending thoracic aorta at 111 HU's. Gantry rotation speed was 250 msecs and collimation was .6 mm. No beta blockade or nitro were given. The 3D data set was reconstructed in 5% intervals of the R-R cycle. Systolic and diastolic phases were analyzed on a dedicated work station using MPR, MIP and VRT modes. The patient received 80 cc of contrast.  FINDINGS: Aortic Valve: Trileaflet  aortic valve with severely calcified leaflets and moderate calcifications extending into the LVOT under the non-coronary sinus.  Aorta: Mild ascending aortic aneurysm with maximum diameter 41 mm, mild diffuse atherosclerotic plaque and calcifications.  Sinotubular Junction: 33 x 33 mm  Ascending Thoracic Aorta: 41 x 40 mm  Aortic Arch: 32 x 31 mm  Descending Thoracic Aorta: 30 x 28  mm  Sinus of Valsalva Measurements:  Non-coronary: 36 mm  Right -coronary: 35 mm  Left -coronary: 35 mm  Coronary Artery Height above Annulus:  Left Main: 13 mm  Right Coronary: 19 mm  Virtual Basal Annulus Measurements:  Maximum/Minimum Diameter: 30.4 x 25.3 mm  Mean Diameter: 26.4 mm  Perimeter: 86.4 mm  Area: 549 mm2  Optimum Fluoroscopic Angle for Delivery: LAO 6 CAU 6  IMPRESSION: 1. Trileaflet aortic valve with severely calcified leaflets and moderate calcifications extending into the LVOT under the non-coronary sinus. Aortic valve calcium score is 4445 consistent with severe aortic stenosis. Annular measurements suitable for delivery of a 29 mm Edwards-SAPIEN 3 valve.  2. Sufficient coronary to annulus distance.  3. Optimum Fluoroscopic Angle for Delivery:  LAO 6 CAU 6.  4. No thrombus in the left atrial appendage.   Electronically Signed   By: Ena Dawley   On: 10/02/2019 20:02   CLINICAL DATA:  Severe aortic stenosis. Pre-TAVR planning.  EXAM: CT ANGIOGRAPHY CHEST, ABDOMEN AND PELVIS  TECHNIQUE: Multidetector CT imaging through the chest, abdomen and pelvis was performed using the standard protocol during bolus administration of intravenous contrast. Multiplanar reconstructed images and MIPs were obtained and reviewed to evaluate the vascular anatomy.  CONTRAST:  51mL OMNIPAQUE IOHEXOL 350 MG/ML SOLN  COMPARISON:  10/30/2018 chest CT angiogram.  FINDINGS: CTA CHEST FINDINGS  Cardiovascular: Mild cardiomegaly. No  significant pericardial effusion/thickening. Diffuse thickening and coarse calcification of the aortic valve. Left anterior descending and right coronary atherosclerosis. Atherosclerotic thoracic aorta with ectatic 4.3 cm ascending thoracic aorta. Normal caliber main pulmonary artery. No central pulmonary emboli.  Mediastinum/Nodes: Subcentimeter hypodense right thyroid nodules are not appreciably changed and require no follow-up. Unremarkable esophagus. No pathologically enlarged axillary, mediastinal or hilar lymph nodes.  Lungs/Pleura: No pneumothorax. No pleural effusion. No acute consolidative airspace disease, lung masses or significant pulmonary nodules. Nonspecific patchy subpleural reticulation and ground-glass opacity at both lung bases, not substantially changed since 10/30/2018 CT.  Musculoskeletal:  No aggressive appearing focal osseous lesions.  CTA ABDOMEN AND PELVIS FINDINGS  Hepatobiliary: Normal liver size. Simple 2.0 cm anterior left liver cyst. Scattered subcentimeter hypodense left liver lesions are too small to characterize and unchanged from 10/30/2018 CT, considered benign. Normal gallbladder with no radiopaque cholelithiasis. No biliary ductal dilatation.  Pancreas: Stable splenule at the pancreatic tail measuring 3.0 x 2.2 cm. Otherwise normal pancreas with no additional pancreatic lesions or pancreatic duct dilation.  Spleen: Normal size. No mass.  Adrenals/Urinary Tract: Normal adrenals. No hydronephrosis. Soft tissue density exophytic 3.0 cm renal cortical lesion in the interpolar left kidney (series 15/image 145), not substantially changed since 07/04/2013 CT, characterized as benign hemorrhagic/proteinaceous renal cyst on 07/21/2013 MRI. Exophytic simple 1.0 cm interpolar left renal cyst. No additional contour deforming renal lesions. Normal bladder.  Stomach/Bowel: Small hiatal hernia. Otherwise normal nondistended stomach. Normal  caliber small bowel with no small bowel wall thickening. Candidate normal diminutive appendix. Normal large bowel with no diverticulosis, large bowel wall thickening or pericolonic fat stranding.  Vascular/Lymphatic: Atherosclerotic abdominal aorta with 3.4 cm infrarenal abdominal aortic aneurysm, unchanged using similar measurement technique. No pathologically enlarged lymph nodes in the abdomen or pelvis.  Reproductive: Mildly enlarged prostate with nonspecific coarse internal prostatic calcifications.  Other: No pneumoperitoneum, ascites or focal fluid collection.  Musculoskeletal: No aggressive appearing focal osseous lesions. Marked lumbar spondylosis.  VASCULAR MEASUREMENTS PERTINENT TO TAVR:  AORTA:  Minimal Aortic Diameter-18.9 x 15.6 mm  Severity of Aortic Calcification-moderate to severe  RIGHT  PELVIS:  Right Common Iliac Artery -  Minimal Diameter-9.5 x 8.7 mm  Tortuosity-mild-to-moderate  Calcification-moderate  Right External Iliac Artery -  Minimal Diameter-8.8 x 8.7 mm  Tortuosity-severe  Calcification-none  Right Common Femoral Artery -  Minimal Diameter-8.5 x 8.4 mm  Tortuosity-mild  Calcification-none  LEFT PELVIS:  Left Common Iliac Artery -  Minimal Diameter-9.5 x 5.9 mm  Tortuosity-mild  Calcification-moderate  Left External Iliac Artery -  Minimal Diameter-8.4 x 8.0 mm  Tortuosity-mild  Calcification-mild  Left Common Femoral Artery -  Minimal Diameter-8.1 x 6.5 mm  Tortuosity-mild  Calcification-moderate  Review of the MIP images confirms the above findings.  IMPRESSION: 1. Vascular findings and measurements pertinent to potential TAVR procedure, as detailed. 2. Diffuse thickening and coarse calcification of the aortic valve, compatible with reported history of severe aortic stenosis. 3. Mild cardiomegaly. Two-vessel coronary atherosclerosis. 4. Stable 3.4 cm infrarenal  abdominal aortic aneurysm. Abdominal Aortic Aneurysm (ICD10-I71.9). Recommend follow-up aortic ultrasound in 3 years. This recommendation follows ACR consensus guidelines: White Paper of the ACR Incidental Findings Committee II on Vascular Findings. J Am Coll Radiol 2013; 10:789-794. 5. Small hiatal hernia. 6. Mildly enlarged prostate. 7.  Aortic Atherosclerosis (ICD10-I70.0).   Electronically Signed   By: Ilona Sorrel M.D.   On: 10/02/2019 13:42   STS Risk Score: Risk of Mortality: 3.899% Renal Failure: 10.498% Permanent Stroke: 1.495% Prolonged Ventilation: 17.116% DSW Infection: 0.170% Reoperation: 5.169% Morbidity or Mortality: 26.213% Short Length of Stay: 8.063% Long Length of Stay: 18.558%   Impression:  This 86 year old gentleman has stage D2, severe, symptomatic, low flow, low gradient, reduced ejection fraction aortic valve stenosis currently with New York Heart Association class II symptoms of exertional fatigue and a BNP of 2308 in January 2021 when he was admitted with atrial fibrillation and shortness of breath consistent with acute on chronic combined systolic and diastolic congestive heart failure.  I have personally reviewed his 2D echocardiogram, cardiac catheterization, and CTA studies.  His echocardiogram shows a severely calcified aortic valve with restricted mobility.  The mean gradient is 22 mmHg with a peak gradient of 41.5 mmHg.  Dimensionless index is 0.14 with a calculated aortic valve area of 0.65 cm by VTI.  Left ventricular ejection fraction was 25 to 30% with global hypokinesis and a left ventricular internal diastolic dimension of 99991111 cm.  Cardiac catheterization showed mild to moderate nonobstructive coronary disease.  I agree that aortic valve replacement is indicated in this gentleman who is still independent and has a reasonable 5-year survival.  He is not very active due to his balance issues with vertigo and I suspect that he would be  more symptomatic if he could be more active.  He was admitted with acute on chronic congestive heart failure in January 2021 when he went into rapid atrial fibrillation and I think it is severe aortic stenosis is not repaired he would likely have a progressive downhill course over the next 1 to 2 years.  I do not think he would be a good candidate for open surgical AVR due to his age, reduced ejection fraction, stage III chronic kidney disease, and reduced mobility.  I think he would be a reasonable candidate for transcatheter aortic valve replacement.  His gated cardiac CTA shows anatomy suitable for transcatheter aortic valve replacement.  He does have some calcification extending into the LVOT under the noncoronary sinus which could increase his risk of perivalvular leak in this area.  His abdominal and pelvic CTA shows very tortuous iliac  arteries with moderate calcific atherosclerosis.  The infrarenal abdominal aorta is mildly aneurysmal at 3.4 cm but also has multiple intraluminal webs suggestive of a chronic dissection.  I will review this with the multidisciplinary heart valve team and consider alternative access via the left subclavian artery.  The patient and his daughter were counseled at length regarding treatment alternatives for management of severe symptomatic aortic stenosis. The risks and benefits of surgical intervention has been discussed in detail. Long-term prognosis with medical therapy was discussed. Alternative approaches such as conventional surgical aortic valve replacement, transcatheter aortic valve replacement, and palliative medical therapy were compared and contrasted at length. This discussion was placed in the context of the patient's own specific clinical presentation and past medical history. All of their questions have been addressed.   Following the decision to proceed with transcatheter aortic valve replacement, a discussion was held regarding what types of management  strategies would be attempted intraoperatively in the event of life-threatening complications, including whether or not the patient would be considered a candidate for the use of cardiopulmonary bypass and/or conversion to open sternotomy for attempted surgical intervention.  He is 86 years old with stage III chronic kidney disease and he will think he would be a candidate for emergent sternotomy to manage any intraoperative complications.  The patient has been advised of a variety of complications that might develop including but not limited to risks of death, stroke, paravalvular leak, aortic dissection or other major vascular complications, aortic annulus rupture, device embolization, cardiac rupture or perforation, mitral regurgitation, acute myocardial infarction, arrhythmia, heart block or bradycardia requiring permanent pacemaker placement, congestive heart failure, respiratory failure, renal failure, pneumonia, infection, other late complications related to structural valve deterioration or migration, or other complications that might ultimately cause a temporary or permanent loss of functional independence or other long term morbidity. The patient provides full informed consent for the procedure as described and all questions were answered.     Plan:  He has been scheduled for transcatheter aortic valve replacement on Tuesday, 10/27/2019.  He will be discussed at the multidisciplinary heart valve team meeting but we will tentatively post him for left subclavian artery access due to the infrarenal abdominal aortic disease.  I spent 60 minutes performing this consultation and > 50% of this time was spent face to face counseling and coordinating the care of this patient's severe symptomatic aortic stenosis.    Gaye Pollack, MD 10/14/2019 10:29 AM

## 2019-10-16 ENCOUNTER — Telehealth: Payer: Self-pay | Admitting: Cardiovascular Disease

## 2019-10-16 DIAGNOSIS — N1832 Chronic kidney disease, stage 3b: Secondary | ICD-10-CM | POA: Diagnosis not present

## 2019-10-16 DIAGNOSIS — I2510 Atherosclerotic heart disease of native coronary artery without angina pectoris: Secondary | ICD-10-CM | POA: Diagnosis not present

## 2019-10-16 DIAGNOSIS — I0981 Rheumatic heart failure: Secondary | ICD-10-CM | POA: Diagnosis not present

## 2019-10-16 DIAGNOSIS — I5023 Acute on chronic systolic (congestive) heart failure: Secondary | ICD-10-CM | POA: Diagnosis not present

## 2019-10-16 DIAGNOSIS — N170 Acute kidney failure with tubular necrosis: Secondary | ICD-10-CM | POA: Diagnosis not present

## 2019-10-16 DIAGNOSIS — I130 Hypertensive heart and chronic kidney disease with heart failure and stage 1 through stage 4 chronic kidney disease, or unspecified chronic kidney disease: Secondary | ICD-10-CM | POA: Diagnosis not present

## 2019-10-16 NOTE — Telephone Encounter (Signed)
Left voicemail message to call back  

## 2019-10-16 NOTE — Telephone Encounter (Signed)
Pt c/o BP issue: STAT if pt c/o blurred vision, one-sided weakness or slurred speech  1. What are your last 5 BP readings?  200/90-manual  180/103-machine 190/88  2. Are you having any other symptoms (ex. Dizziness, headache, blurred vision, passed out)? No symptoms   3. What is your BP issue? Very high  [place] states he has been eating more salty food lately

## 2019-10-19 ENCOUNTER — Other Ambulatory Visit: Payer: Self-pay

## 2019-10-19 DIAGNOSIS — N170 Acute kidney failure with tubular necrosis: Secondary | ICD-10-CM | POA: Diagnosis not present

## 2019-10-19 DIAGNOSIS — I0981 Rheumatic heart failure: Secondary | ICD-10-CM | POA: Diagnosis not present

## 2019-10-19 DIAGNOSIS — I350 Nonrheumatic aortic (valve) stenosis: Secondary | ICD-10-CM

## 2019-10-19 DIAGNOSIS — I130 Hypertensive heart and chronic kidney disease with heart failure and stage 1 through stage 4 chronic kidney disease, or unspecified chronic kidney disease: Secondary | ICD-10-CM | POA: Diagnosis not present

## 2019-10-19 DIAGNOSIS — I5023 Acute on chronic systolic (congestive) heart failure: Secondary | ICD-10-CM | POA: Diagnosis not present

## 2019-10-19 DIAGNOSIS — I2510 Atherosclerotic heart disease of native coronary artery without angina pectoris: Secondary | ICD-10-CM | POA: Diagnosis not present

## 2019-10-19 DIAGNOSIS — N1832 Chronic kidney disease, stage 3b: Secondary | ICD-10-CM | POA: Diagnosis not present

## 2019-10-21 NOTE — Telephone Encounter (Signed)
I saw him once as DOD due to bradycardia.  Dr. Fletcher Anon is his primary cardiologist in [place].  Given that his most recent BP when I saw him was on the low side, it might be best for him to see Dr. Fletcher Anon or an APP within the next week to reassess his BP and determine best treatment strategy in the setting of his severe AS.  Clinton Holmes

## 2019-10-21 NOTE — Telephone Encounter (Signed)
Spoke with [place] PT provider. She states that patient only has elevated readings when she goes for his PT. Home health nurse and patients daughter do not report any elevated readings. She somewhat feels that this is due to anticipatory therapy. Requested that she inform daughter to check blood pressures 2 hours after medications and keep a log of those readings so that we can see how they trend and to bring that in on next visit. She verbalized understanding with no further questions at this time.

## 2019-10-21 NOTE — Telephone Encounter (Signed)
Agree with you. Thanks!

## 2019-10-21 NOTE — Telephone Encounter (Signed)
Pt is followed by Dr End in [place] now.

## 2019-10-22 ENCOUNTER — Other Ambulatory Visit: Payer: Self-pay | Admitting: *Deleted

## 2019-10-22 DIAGNOSIS — N1832 Chronic kidney disease, stage 3b: Secondary | ICD-10-CM | POA: Diagnosis not present

## 2019-10-22 DIAGNOSIS — I130 Hypertensive heart and chronic kidney disease with heart failure and stage 1 through stage 4 chronic kidney disease, or unspecified chronic kidney disease: Secondary | ICD-10-CM | POA: Diagnosis not present

## 2019-10-22 DIAGNOSIS — I0981 Rheumatic heart failure: Secondary | ICD-10-CM | POA: Diagnosis not present

## 2019-10-22 DIAGNOSIS — N170 Acute kidney failure with tubular necrosis: Secondary | ICD-10-CM | POA: Diagnosis not present

## 2019-10-22 DIAGNOSIS — I5023 Acute on chronic systolic (congestive) heart failure: Secondary | ICD-10-CM | POA: Diagnosis not present

## 2019-10-22 DIAGNOSIS — I2510 Atherosclerotic heart disease of native coronary artery without angina pectoris: Secondary | ICD-10-CM | POA: Diagnosis not present

## 2019-10-22 NOTE — Patient Outreach (Signed)
[facility]) Care Management  [place]  10/22/2019   Clinton Holmes Jul 21, 1928 [id number]  [place] telephone call to patient.  [employer] compliance verified.  Per patient he is doing fair. Patient has some swelling in lower extremities. Patient is weighing daily. Per patient he is not in any pain. Patient is having Aortic Valve Replacement next week due to Aortic stenosis. Patient has not has any recent falls. No recent admissions for CHF exacerbation. Per patient he has been taking his medications as prescribed. Patient had a dental cleaning and was told that he needs a lot of dental work. Patient has poor vision in his left eye. Per patient it is like half of the eye is black. Patient has agreed to further outreach calls.  Encounter Medications:  Outpatient Encounter Medications as of 10/22/2019  Medication Sig  . amoxicillin (AMOXIL) 500 MG capsule Take 2,000 mg by mouth See admin instructions. Take 4 capsules (2000 mg) by mouth 1 hour prior to dental appointment  . apixaban (ELIQUIS) 2.5 MG TABS tablet Take 1 tablet (2.5 mg total) by mouth 2 (two) times daily.  Marland Kitchen aspirin EC 81 MG EC tablet Take 1 tablet (81 mg total) by mouth daily.  . furosemide (LASIX) 40 MG tablet Take 1 tablet (40 mg total) by mouth daily.  . metoprolol succinate (TOPROL-XL) 50 MG 24 hr tablet Take 1 tablet (50 mg total) by mouth 2 (two) times daily. Take with or immediately following a meal.  . Multiple Vitamin (MULTIVITAMIN WITH MINERALS) TABS tablet Take 1 tablet by mouth daily. One-A-Day for Adults 50+   No facility-administered encounter medications on file as of 10/22/2019.    Functional Status:  In your present state of health, do you have any difficulty performing the following activities: 08/14/2019 04/28/2019  Hearing? Y Y  Comment - lt ear deaf  Vision? N Y  Comment - rt eye blindness  Difficulty concentrating or making decisions? N N  Walking or climbing stairs? N Y   Comment - ambulates with walker at all times  Dressing or bathing? N Y  Doing errands, shopping? N N  Comment - daughter assists patient  Preparing Food and eating ? - Y  Comment - daughter prepare food  Using the Toilet? - N  In the past six months, have you accidently leaked urine? - Y  Do you have problems with loss of bowel control? - N  Managing your Medications? - Y  Comment - daughter arranges medications  Managing your Finances? - N  Housekeeping or managing your Housekeeping? - Y  Comment - daughter assists  Some recent data might be hidden    Fall/Depression Screening: Fall Risk  10/22/2019 07/28/2019 04/28/2019  Falls in the past year? 0 0 0  Number falls in past yr: 0 0 0  Injury with Fall? 0 0 0  Risk for fall due to : Impaired balance/gait;Impaired mobility Impaired balance/gait;Impaired mobility Impaired balance/gait;Impaired mobility  Follow up Falls evaluation completed;Falls prevention discussed Falls evaluation completed Falls evaluation completed;Falls prevention discussed;Education provided   [facility] 2/9 Scores 10/22/2019 07/28/2019 04/28/2019 01/26/2019 11/24/2018 07/28/2018  PHQ - 2 Score 0 0 0 0 0 0   THN CM Care Plan Problem One     Most Recent Value  Care Plan Problem One  Knowledge Deficit in Self Management of Congestive Heart Failure  Role Documenting the Problem One  [place] for Problem One  Active  THN Long Term Goal  Patient will not have any admissions due to CHF exacerbation  THN Long Term Goal Start Date  10/22/19  Interventions for Problem One Long Term Goal  RN reiterates daily weight, medication adherence and low sodium diet. RN will follow up with CHF exacerbation and zones each outreach.  THN CM Short Term Goal #1   Patient will verbalize weighing daily and keeping a record within the next 30 days  THN CM Short Term Goal #1 Start Date  10/22/19  Interventions for Short Term Goal #1  RN reiterates daily weights and monitoring for  swelling. RN will follow up for compliance  THN CM Short Term Goal #2   Patient will verbalize understanding and following coronavirus safety precautions within the next 30 days  THN CM Short Term Goal #2 Start Date  10/22/19  Interventions for Short Term Goal #2  Patient has received the 2 COVID vaccines. RN discussed with patient about still doing pandemic precautions. RN follows up for compliance  THN CM Short Term Goal #3  Patient will verbalize follow up on Health Maintenance within the next 30 days  THN CM Short Term Goal #3 Start Date  10/22/19  Interventions for Short Tern Goal #3  RN discussed health maintenance and keeping up to date with his eye exams. Patient has received Covid vaccine. Per patienthis teeth are in poor health. He has been getting them cleaned      Assessment:  Patient is weighing daily Patient has received his COVID vaccines No recent falls No admissions for CHF exacerbation since last outreach Patient is receiving physical therapy Plan:  Patient is having an aortic valve replacement on [id number] Patient will continue physical therapy after surgery  [facility] [phone]

## 2019-10-22 NOTE — Telephone Encounter (Addendum)
Secure messaged Michalene, RN Dr. Camillia Herter nurse.  BP issues should be managed by the patients primary Cardiologist Dr. Fletcher Anon. Patient has declined scheduling an appt with [facility] since he is currently being worked up for TAVR at [place], RN will include the TAVR nurse navigator Arsenio Katz, RN in the message.  Arsenio Katz, RN is aware and they will adjust BP medications accordingly during his TAVR work up.

## 2019-10-22 NOTE — Telephone Encounter (Signed)
Patient declined to schedule at this time.  States he is having valve replaced next week and wants to wait and see if he is still around in 2 weeks.  Patient also unable to call back to schedule when he is discharged as he is unable to write down the office call back number.    Patient wants to know if this is needed as he is seen at [facility] and [facility] office.

## 2019-10-23 ENCOUNTER — Ambulatory Visit (HOSPITAL_COMMUNITY)
Admission: RE | Admit: 2019-10-23 | Discharge: 2019-10-23 | Disposition: A | Discharge status: Home / Self Care | Payer: Medicare Other | Source: Ambulatory Visit | Attending: Cardiovascular Disease | Admitting: Cardiovascular Disease

## 2019-10-23 ENCOUNTER — Other Ambulatory Visit (HOSPITAL_COMMUNITY)
Admission: RE | Admit: 2019-10-23 | Discharge: 2019-10-23 | Disposition: A | Discharge status: Home / Self Care | Payer: Medicare Other | Source: Ambulatory Visit | Attending: Cardiovascular Disease | Admitting: Cardiovascular Disease

## 2019-10-23 ENCOUNTER — Encounter (HOSPITAL_COMMUNITY)
Admission: RE | Admit: 2019-10-23 | Discharge: 2019-10-23 | Disposition: A | Discharge status: Home / Self Care | Payer: Medicare Other | Source: Ambulatory Visit | Attending: Cardiovascular Disease | Admitting: Cardiovascular Disease

## 2019-10-23 ENCOUNTER — Encounter (HOSPITAL_COMMUNITY): Payer: Self-pay

## 2019-10-23 ENCOUNTER — Other Ambulatory Visit: Payer: Self-pay

## 2019-10-23 DIAGNOSIS — I517 Cardiomegaly: Secondary | ICD-10-CM | POA: Insufficient documentation

## 2019-10-23 DIAGNOSIS — I350 Nonrheumatic aortic (valve) stenosis: Secondary | ICD-10-CM

## 2019-10-23 DIAGNOSIS — R9431 Abnormal electrocardiogram [ECG] [EKG]: Secondary | ICD-10-CM | POA: Insufficient documentation

## 2019-10-23 DIAGNOSIS — Z01818 Encounter for other preprocedural examination: Secondary | ICD-10-CM | POA: Insufficient documentation

## 2019-10-23 DIAGNOSIS — Z20822 Contact with and (suspected) exposure to covid-19: Secondary | ICD-10-CM | POA: Insufficient documentation

## 2019-10-23 DIAGNOSIS — R001 Bradycardia, unspecified: Secondary | ICD-10-CM | POA: Diagnosis not present

## 2019-10-23 HISTORY — DX: Pneumonia, unspecified organism: J18.9

## 2019-10-23 LAB — URINALYSIS, ROUTINE W REFLEX MICROSCOPIC
Bilirubin Urine: NEGATIVE
Glucose, UA: NEGATIVE mg/dL
Hgb urine dipstick: NEGATIVE
Ketones, ur: NEGATIVE mg/dL
Leukocytes,Ua: NEGATIVE
Nitrite: NEGATIVE
Protein, ur: NEGATIVE mg/dL
Specific Gravity, Urine: 1.008 (ref 1.005–1.030)
pH: 5 (ref 5.0–8.0)

## 2019-10-23 LAB — CBC
HCT: 41.1 % (ref 39.0–52.0)
Hemoglobin: 13.5 g/dL (ref 13.0–17.0)
MCH: 29.7 pg (ref 26.0–34.0)
MCHC: 32.8 g/dL (ref 30.0–36.0)
MCV: 90.3 fL (ref 80.0–100.0)
Platelets: 184 10*3/uL (ref 150–400)
RBC: 4.55 MIL/uL (ref 4.22–5.81)
RDW: 14.6 % (ref 11.5–15.5)
WBC: 6.5 10*3/uL (ref 4.0–10.5)
nRBC: 0 % (ref 0.0–0.2)

## 2019-10-23 LAB — BRAIN NATRIURETIC PEPTIDE: B Natriuretic Peptide: 708.2 pg/mL — ABNORMAL HIGH (ref 0.0–100.0)

## 2019-10-23 LAB — BLOOD GAS, ARTERIAL
Acid-Base Excess: 2 mmol/L (ref 0.0–2.0)
Bicarbonate: 26.1 mmol/L (ref 20.0–28.0)
FIO2: 21
O2 Saturation: 98 %
Patient temperature: 37
pCO2 arterial: 41.5 mmHg (ref 32.0–48.0)
pH, Arterial: 7.416 (ref 7.350–7.450)
pO2, Arterial: 110 mmHg — ABNORMAL HIGH (ref 83.0–108.0)

## 2019-10-23 LAB — TYPE AND SCREEN
ABO/RH(D): A POS
Antibody Screen: NEGATIVE

## 2019-10-23 LAB — COMPREHENSIVE METABOLIC PANEL
ALT: 31 U/L (ref 0–44)
AST: 32 U/L (ref 15–41)
Albumin: 4.1 g/dL (ref 3.5–5.0)
Alkaline Phosphatase: 55 U/L (ref 38–126)
Anion gap: 10 (ref 5–15)
BUN: 27 mg/dL — ABNORMAL HIGH (ref 8–23)
CO2: 24 mmol/L (ref 22–32)
Calcium: 9.9 mg/dL (ref 8.9–10.3)
Chloride: 105 mmol/L (ref 98–111)
Creatinine, Ser: 1.46 mg/dL — ABNORMAL HIGH (ref 0.61–1.24)
GFR calc Af Amer: 48 mL/min — ABNORMAL LOW (ref >60)
GFR calc non Af Amer: 42 mL/min — ABNORMAL LOW (ref >60)
Glucose, Bld: 102 mg/dL — ABNORMAL HIGH (ref 70–99)
Potassium: 4.4 mmol/L (ref 3.5–5.1)
Sodium: 139 mmol/L (ref 135–145)
Total Bilirubin: 0.7 mg/dL (ref 0.3–1.2)
Total Protein: 6.6 g/dL (ref 6.5–8.1)

## 2019-10-23 LAB — HEMOGLOBIN A1C
Hgb A1c MFr Bld: 5.3 % (ref 4.8–5.6)
Mean Plasma Glucose: 105.41 mg/dL

## 2019-10-23 LAB — SURGICAL PCR SCREEN
MRSA, PCR: NEGATIVE
Staphylococcus aureus: NEGATIVE

## 2019-10-23 LAB — PROTIME-INR
INR: 1.1 (ref 0.8–1.2)
Prothrombin Time: 14.2 seconds (ref 11.4–15.2)

## 2019-10-23 LAB — APTT: aPTT: 30 seconds (ref 24–36)

## 2019-10-23 LAB — SARS CORONAVIRUS 2 (TAT 6-24 HRS): SARS Coronavirus 2: NEGATIVE

## 2019-10-23 NOTE — Pre-Procedure Instructions (Signed)
Clinton Holmes  10/23/2019    Your procedure is scheduled on Tuesday, October 27, 2019    Report to [facility] Entrance "A" Admitting Office at 8:45 AM.   Call this number if you have problems the morning of surgery: [phone]   Questions prior to day of surgery, please call [phone] between 8 & 4 PM.   Remember:  Do not eat or drink after midnight night before your surgery  Take these medicines the morning of surgery with A SIP OF WATER: NONE  Stop Eliquis as directed by surgeon. Stop Multivitamins as of today prior to surgery. Do not use NSAIDS (Ibuprofen, Aleve, etc), other Aspirin products (BC Powders, Goody's, etc), Fish Oil or Herbal medications prior to surgery.    Do not wear jewelry.  Do not wear lotions, powders, cologne or deodorant.  Men may shave face and neck.  Do not bring valuables to the hospital.  [facility] is not responsible for any belongings or valuables.  Contacts, dentures or bridgework may not be worn into surgery.  Leave your suitcase in the car.  After surgery it may be brought to your room.  For patients admitted to the hospital, discharge time will be determined by your treatment team.  [facility] - Preparing for Surgery  Before surgery, you can play an important role.  Because skin is not sterile, your skin needs to be as free of germs as possible.  You can reduce the number of germs on you skin by washing with CHG (chlorahexidine gluconate) soap before surgery.  CHG is an antiseptic cleaner which kills germs and bonds with the skin to continue killing germs even after washing.  Oral Hygiene is also important in reducing the risk of infection.  Remember to brush your teeth with your regular toothpaste the morning of surgery.  Please DO NOT use if you have an allergy to CHG or antibacterial soaps.  If your skin becomes reddened/irritated stop using the CHG and inform your nurse when you arrive at Short Stay.  Do not shave (including  legs and underarms) for at least 48 hours prior to the first CHG shower.  You may shave your face.  Please follow these instructions carefully:   1.  Shower with CHG Soap the night before surgery and the morning of Surgery.  2.  If you choose to wash your hair, wash your hair first as usual with your normal shampoo.  3.  After you shampoo, rinse your hair and body thoroughly to remove the shampoo. 4.  Use CHG as you would any other liquid soap.  You can apply chg directly to the skin and wash gently with a      scrungie or washcloth.           5.  Apply the CHG Soap to your body ONLY FROM THE NECK DOWN.   Do not use on open wounds or open sores. Avoid contact with your eyes, ears, mouth and genitals (private parts).  Wash genitals (private parts) with your normal soap - do this prior to using CHG soap.  6.  Wash thoroughly, paying special attention to the area where your surgery will be performed.  7.  Thoroughly rinse your body with warm water from the neck down.  8.  DO NOT shower/wash with your normal soap after using and rinsing off the CHG Soap.  9.  Pat yourself dry with a clean towel.  10.  Wear clean pajamas.            11.  Place clean sheets on your bed the night of your first shower and do not sleep with pets.  Day of Surgery  Shower as above. Do not apply any lotions/deodorants the morning of surgery.   Please wear clean clothes to the hospital. Remember to brush your teeth with toothpaste.  Please read over the fact sheets that you were given.

## 2019-10-23 NOTE — Pre-Procedure Instructions (Signed)
Clinton Holmes  10/23/2019    Your procedure is scheduled on Tuesday, October 27, 2019 at 10:45 AM.   Report to [facility] Entrance "A" Admitting Office at 8:45 AM.   Call this number if you have problems the morning of surgery: [phone]   Questions prior to day of surgery, please call [phone] between 8 & 4 PM.   Remember:  Do not eat or drink after midnight Monday, 10/26/19.  Take these medicines the morning of surgery with A SIP OF WATER: NONE  Stop Eliquis as directed by surgeon. Stop Multivitamins as of today prior to surgery. Do not use NSAIDS (Ibuprofen, Aleve, etc), other Aspirin products (BC Powders, Goody's, etc), Fish Oil or Herbal medications prior to surgery.    Do not wear jewelry.  Do not wear lotions, powders, cologne or deodorant.  Men may shave face and neck.  Do not bring valuables to the hospital.  [facility] is not responsible for any belongings or valuables.  Contacts, dentures or bridgework may not be worn into surgery.  Leave your suitcase in the car.  After surgery it may be brought to your room.  For patients admitted to the hospital, discharge time will be determined by your treatment team.  [facility] - Preparing for Surgery  Before surgery, you can play an important role.  Because skin is not sterile, your skin needs to be as free of germs as possible.  You can reduce the number of germs on you skin by washing with CHG (chlorahexidine gluconate) soap before surgery.  CHG is an antiseptic cleaner which kills germs and bonds with the skin to continue killing germs even after washing.  Oral Hygiene is also important in reducing the risk of infection.  Remember to brush your teeth with your regular toothpaste the morning of surgery.  Please DO NOT use if you have an allergy to CHG or antibacterial soaps.  If your skin becomes reddened/irritated stop using the CHG and inform your nurse when you arrive at Short Stay.  Do not shave (including  legs and underarms) for at least 48 hours prior to the first CHG shower.  You may shave your face.  Please follow these instructions carefully:   1.  Shower with CHG Soap the night before surgery and the morning of Surgery.  2.  If you choose to wash your hair, wash your hair first as usual with your normal shampoo.  3.  After you shampoo, rinse your hair and body thoroughly to remove the shampoo. 4.  Use CHG as you would any other liquid soap.  You can apply chg directly to the skin and wash gently with a      scrungie or washcloth.           5.  Apply the CHG Soap to your body ONLY FROM THE NECK DOWN.   Do not use on open wounds or open sores. Avoid contact with your eyes, ears, mouth and genitals (private parts).  Wash genitals (private parts) with your normal soap - do this prior to using CHG soap.  6.  Wash thoroughly, paying special attention to the area where your surgery will be performed.  7.  Thoroughly rinse your body with warm water from the neck down.  8.  DO NOT shower/wash with your normal soap after using and rinsing off the CHG Soap.  9.  Pat yourself dry with a clean towel.  10.  Wear clean pajamas.            11.  Place clean sheets on your bed the night of your first shower and do not sleep with pets.  Day of Surgery  Shower as above. Do not apply any lotions/deodorants the morning of surgery.   Please wear clean clothes to the hospital. Remember to brush your teeth with toothpaste.  Please read over the fact sheets that you were given.

## 2019-10-23 NOTE — Progress Notes (Signed)
PCP - Daiva Eves, MD Cardiologist - Marlou Porch, MD // Fletcher Anon, MD  Chest x-ray - 10/23/19 EKG - 10/23/19 Stress Test - pt denies ECHO - 08/19/19 Cardiac Cath - 09/23/19   Follow your surgeon's instructions on when to stop Aspirin and apixaban (ELIQUIS).  If no instructions were given by your surgeon then you will need to call the office to get those instructions.    Per surgeon's order, per pt and pt's daughter Eliquis last dose 10/21/19. Per daughter, no additional instructions given for ASA. "He didn't tell us anything about aspirin, but he did say to take no medication on the morning of his surgery". Pt instructed to follow doctors orders.    ERAS Protcol - no  COVID TEST- after PAT appt 10/23/19   Anesthesia review: yes, cardiac hx, recent hospitalization (08/13/19 - 1/10-21)  Patient denies shortness of breath, fever, cough and chest pain at PAT appointment   All instructions explained to the patient, with a verbal understanding of the material. Patient agrees to go over the instructions while at home for a better understanding. Patient also instructed to self quarantine after being tested for COVID-19. The opportunity to ask questions was provided.

## 2019-10-26 DIAGNOSIS — I0981 Rheumatic heart failure: Secondary | ICD-10-CM | POA: Diagnosis not present

## 2019-10-26 DIAGNOSIS — I2510 Atherosclerotic heart disease of native coronary artery without angina pectoris: Secondary | ICD-10-CM | POA: Diagnosis not present

## 2019-10-26 DIAGNOSIS — I130 Hypertensive heart and chronic kidney disease with heart failure and stage 1 through stage 4 chronic kidney disease, or unspecified chronic kidney disease: Secondary | ICD-10-CM | POA: Diagnosis not present

## 2019-10-26 DIAGNOSIS — N170 Acute kidney failure with tubular necrosis: Secondary | ICD-10-CM | POA: Diagnosis not present

## 2019-10-26 DIAGNOSIS — N1832 Chronic kidney disease, stage 3b: Secondary | ICD-10-CM | POA: Diagnosis not present

## 2019-10-26 DIAGNOSIS — I5023 Acute on chronic systolic (congestive) heart failure: Secondary | ICD-10-CM | POA: Diagnosis not present

## 2019-10-26 MED ORDER — SODIUM CHLORIDE 0.9 % IV SOLN
1.5000 g | INTRAVENOUS | Status: AC
  Administered 2019-10-27: 1.5 g via INTRAVENOUS
  Filled 2019-10-26: qty 1.5

## 2019-10-26 MED ORDER — MAGNESIUM SULFATE 50 % IJ SOLN
40.0000 meq | INTRAMUSCULAR | Status: DC
  Filled 2019-10-26: qty 9.85

## 2019-10-26 MED ORDER — POTASSIUM CHLORIDE 2 MEQ/ML IV SOLN
80.0000 meq | INTRAVENOUS | Status: DC
  Filled 2019-10-26: qty 40

## 2019-10-26 MED ORDER — SODIUM CHLORIDE 0.9 % IV SOLN
INTRAVENOUS | Status: DC
  Filled 2019-10-26: qty 30

## 2019-10-26 MED ORDER — VANCOMYCIN HCL 1500 MG/300ML IV SOLN
1500.0000 mg | INTRAVENOUS | Status: AC
  Administered 2019-10-27: 1500 mg via INTRAVENOUS
  Filled 2019-10-26: qty 300

## 2019-10-26 MED ORDER — NOREPINEPHRINE 4 MG/250ML-% IV SOLN
0.0000 ug/min | INTRAVENOUS | Status: AC
  Administered 2019-10-27: 2 ug/min via INTRAVENOUS
  Filled 2019-10-26: qty 250

## 2019-10-26 MED ORDER — DEXMEDETOMIDINE HCL IN NACL 400 MCG/100ML IV SOLN
0.1000 ug/kg/h | INTRAVENOUS | Status: DC
  Filled 2019-10-26: qty 100

## 2019-10-26 NOTE — H&P (Signed)
[address]       [place] [address]             [phone]      Cardiothoracic Surgery Admission History and Physical   Referring Provider is Jerline Pain, MD  Primary Cardiologist is Candee Furbish, MD  PCP is Hamrick, Lorin Mercy, MD      Chief Complaint  Patient presents with  . Aortic Stenosis       HPI:  The patient is a 86 year old gentleman with a history of hypertension, atrial fibrillation on Eliquis, stage III chronic kidney disease, nonobstructive coronary disease, PVCs, ischemic cardiomyopathy, and aortic stenosis who is being evaluated for consideration of transcatheter aortic valve placement. He was admitted to [facility] in January 2021 with atrial fibrillation with rapid ventricular response as well as nausea and vomiting. An echocardiogram showed an ejection fraction of 25 to 30%. The aortic valve was calcified with restricted mobility. The mean gradient across the valve was 22 mmHg with a peak gradient of 41 mmHg. Aortic valve area was 0.65 cm with a dimensionless index of 0.14. This was felt to represent low flow, low gradient, severe aortic stenosis. He underwent TEE guided cardioversion and was started on Eliquis. He subsequently underwent cardiac catheterization on 09/23/2019 showing mild to moderate nonobstructive coronary disease in the proximal and mid LAD as well as the ramus branch. Right heart cath showed normal pulmonary artery pressures with a mean wedge pressure of 11 mmHg. CVP was normal. Cardiac index was 2.47.   He came to the office with one of his daughters. He was in a wheelchair and said that he walks with a walker due to balance problems since he had an acoustic neuroma of the left ear treated about 15 years ago. He has had multiple falls in the past but none for the last several years. His daughter said that he is not very active at home mainly walking in his house and watching TV. He denies any shortness of breath or chest  pain. He does report some fatigue and his daughter feels that he is not as active as he could be. He denies any dizziness but does have some vertigo. He lives alone but has 3 children that help him out. 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       Past Medical History:  Diagnosis Date  . CAD (coronary artery disease)    L&RHC 12/23/2014 elevated LVEDP, prox LAD 45%, 60% ramus  . CKD (chronic kidney disease), stage III   . Essential hypertension   . Ischemic cardiomyopathy   . Murmur   . OA (osteoarthritis)    a. 2011 s/p bilat TKA.  . Severe aortic stenosis   . Ventricular bigeminy    a. 12/2014. Started on amio on 12/24/2014        Past Surgical History:  Procedure Laterality Date  . CARDIAC CATHETERIZATION N/A 12/23/2014   Procedure: Right/Left Heart Cath and Coronary Angiography; Surgeon: Belva Crome, MD; Location: [place] [facility]; Service: Cardiovascular; Laterality: N/A;  . CARDIOVERSION N/A 08/19/2019   Procedure: CARDIOVERSION; Surgeon: Minna Merritts, MD; Location: [facility]; Service: Cardiovascular; Laterality: N/A;  . KNEE SURGERY    . RIGHT/LEFT HEART CATH AND CORONARY ANGIOGRAPHY N/A 09/23/2019   Procedure: RIGHT/LEFT HEART CATH AND CORONARY ANGIOGRAPHY; Surgeon: Burnell Blanks, MD; Location: [place] [facility]; Service: Cardiovascular; Laterality: N/A;  .  SHOULDER SURGERY Left   . TEE WITHOUT CARDIOVERSION N/A 08/19/2019   Procedure: TRANSESOPHAGEAL ECHOCARDIOGRAM (TEE); Surgeon: Minna Merritts, MD; Location: [facility]; Service: Cardiovascular; Laterality: N/A;  . TONSILLECTOMY          Family History  Problem Relation Age of Onset  . Heart attack Father    deceased @ 21.  . Congestive Heart Failure Sister    alive in her late 22's.  . Other Brother    alive @ 70.  Marland Kitchen CAD Mother    H/o CABG, deceased @ 67.   Social History        Socioeconomic History  . Marital status: Divorced    Spouse name:  Not on file  . Number of children: 5  . Years of education: Not on file  . Highest education level: Not on file  Occupational History  . Occupation: Retired-Farmer  Tobacco Use  . Smoking status: Former Research scientist (life sciences)  . Smokeless tobacco: Never Used  . Tobacco comment: Smoked for a few yrs in the service. Quit in 1968. Never a heavy smoker.  Substance and Sexual Activity  . Alcohol use: No    Alcohol/week: 0.0 standard drinks  . Drug use: No  . Sexual activity: Not on file  Other Topics Concern  . Not on file  Social History Narrative   Lives between [place] and [place] by himself. He does not routinely exercise.   Social Determinants of Health      Financial Resource Strain:   . Difficulty of Paying Living Expenses: Not on file  Food Insecurity:   . Worried About Charity fundraiser in the Last Year: Not on file  . Ran Out of Food in the Last Year: Not on file  Transportation Needs:   . Lack of Transportation (Medical): Not on file  . Lack of Transportation (Non-Medical): Not on file  Physical Activity:   . Days of Exercise per Week: Not on file  . Minutes of Exercise per Session: Not on file  Stress:   . Feeling of Stress : Not on file  Social Connections:   . Frequency of Communication with Friends and Family: Not on file  . Frequency of Social Gatherings with Friends and Family: Not on file  . Attends Religious Services: Not on file  . Active Member of Clubs or Organizations: Not on file  . Attends Archivist Meetings: Not on file  . Marital Status: Not on file  Intimate Partner Violence:   . Fear of Current or Ex-Partner: Not on file  . Emotionally Abused: Not on file  . Physically Abused: Not on file  . Sexually Abused: Not on file         Current Outpatient Medications  Medication Sig Dispense Refill  . apixaban (ELIQUIS) 2.5 MG TABS tablet Take 1 tablet (2.5 mg total) by mouth 2 (two) times daily. 60 tablet 6  . aspirin EC 81 MG EC tablet Take 1  tablet (81 mg total) by mouth daily.    . furosemide (LASIX) 40 MG tablet Take 1 tablet (40 mg total) by mouth daily. 60 tablet 6  . metoprolol succinate (TOPROL-XL) 50 MG 24 hr tablet Take 1 tablet (50 mg total) by mouth 2 (two) times daily. Take with or immediately following a meal. 60 tablet 6  . Multiple Vitamins-Minerals (MULTIVITAMIN ADULTS 50+ PO) Take 1 tablet by mouth daily.     No current facility-administered medications for this visit.   No Known Allergies  Review of Systems:   General: normal appetite, + decreased energy, no weight gain, no weight loss, no fever  Cardiac: no chest pain with exertion, no chest pain at rest, no SOB with exertion, no resting SOB, no PND, no orthopnea, + palpitations, + arrhythmia, + atrial fibrillation, + LE edema, no dizzy spells, no syncope  Respiratory: no shortness of breath, no home oxygen, no productive cough, no dry cough, no bronchitis, no wheezing, no hemoptysis, no asthma, no pain with inspiration or cough, no sleep apnea, no CPAP at night  GI: no difficulty swallowing, no reflux, no frequent heartburn, no hiatal hernia, no abdominal pain, no constipation, no diarrhea, no hematochezia, no hematemesis, no melena  GU: no dysuria, + frequency, no urinary tract infection, no hematuria, no enlarged prostate, no kidney stones, + kidney disease  Vascular: no pain suggestive of claudication, no pain in feet, no leg cramps, no varicose veins, no DVT, no non-healing foot ulcer  Neuro: no stroke, + TIA's, no seizures, no headaches, no temporary blindness one eye, no slurred speech, + peripheral neuropathy in hands, no chronic pain, + instability of gait, no memory/cognitive dysfunction  Musculoskeletal: + arthritis, no joint swelling, no myalgias, + difficulty walking, + reduced mobility  Skin: no rash, no itching, no skin infections, no pressure sores or ulcerations  Psych: no anxiety, no depression, no nervousness, no unusual recent stress  Eyes: no  blurry vision, no floaters, no recent vision changes, + wears glasses or contacts, blind left eye.  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 of acoustic neuroma, no loose or painful teeth, no dentures, last saw dentist 04/14/19  Hematologic: no easy bruising, no abnormal bleeding, no clotting disorder, no frequent epistaxis  Endocrine: no diabetes, does not check CBG's at home    Physical Exam:   BP (!) 146/70 (BP Location: Left Arm, Patient Position: Sitting, Cuff Size: Normal)  Pulse (!) 53  Temp (!) 97.5 F (36.4 C) (Temporal)  Resp 20  Ht 6' (1.829 m)  Wt 234 lb (106.1 kg)  SpO2 96% Comment: RA  BMI 31.74 kg/m  General: Elderly, well-appearing but in wheel chair  HEENT: Unremarkable, NCAT, PERLA, EOMI, teeth in fair condition  Neck: no JVD, no bruits, no adenopathy  Chest: clear to auscultation, symmetrical breath sounds, no wheezes, no rhonchi  CV: RRR, grade lll/VI crescendo/decrescendo murmur heard best at RSB, no diastolic murmur  Abdomen: soft, non-tender, no masses  Extremities: warm, well-perfused, pulses palpable at ankle, trace LE edema  Rectal/GU Deferred  Neuro: Grossly non-focal and symmetrical throughout  Skin: Clean and dry, no rashes, no breakdown    Diagnostic Tests:   ECHOCARDIOGRAM REPORT     Patient Name: MARIS HASTON Date of Exam: 08/14/2019  Medical Rec #: [id number] Height: 72.0 in  Accession #: [id number] Weight: 241.0 lb  Date of Birth: April 08, 1928 BSA: 2.31 m  Patient Age: 83 years BP: 117/96 mmHg  Patient Gender: M HR: 100 bpm.  Exam Location: [facility]   Procedure: 2D Echo, Color Doppler and Cardiac Doppler   Indications: I48.91 Atrial fibrillation   History: Patient has prior history of Echocardiogram examinations.  CAD,  CKD; Risk Factors:Hypertension.   Sonographer: Charmayne Sheer RDCS (AE)  Referring Phys: [id number] Arvella Merles MANSY  Diagnosing Phys: Kate Sable MD   IMPRESSIONS    1. Left ventricular ejection fraction, by  visual estimation, is 25 to  30%. The left ventricle has severely decreased function. There is mildly  increased left ventricular hypertrophy.  2. Left ventricular diastolic parameters are indeterminate.  3. The left ventricle demonstrates global hypokinesis.  4. Global right ventricle has mildly reduced systolic function.The right  ventricular size is normal. No increase in right ventricular wall  thickness.  5. Left atrial size was mildly dilated.  6. Right atrial size was normal.  7. The mitral valve is grossly normal. Trivial mitral valve  regurgitation.  8. The tricuspid valve is normal in structure.  9. Aortic valve area, by VTI measures 0.65 cm.  10. Aortic valve mean gradient measures 22.0 mmHg.  11. Aortic valve peak gradient measures 41.5 mmHg.  12. The aortic valve is abnormal. Aortic valve regurgitation is not  visualized. Severe aortic valve stenosis.  13. The pulmonic valve was not well visualized. Pulmonic valve  regurgitation is not visualized.  14. Moderately elevated pulmonary artery systolic pressure.  15. The inferior vena cava is dilated in size with <50% respiratory  variability, suggesting right atrial pressure of 15 mmHg.   FINDINGS  Left Ventricle: Left ventricular ejection fraction, by visual estimation,  is 25 to 30%. The left ventricle has severely decreased function. The left  ventricle demonstrates global hypokinesis. The left ventricular internal  cavity size was the left  ventricle is normal in size. There is mildly increased left ventricular  hypertrophy. Left ventricular diastolic parameters are indeterminate.   Right Ventricle: The right ventricular size is normal. No increase in  right ventricular wall thickness. Global RV systolic function is has  mildly reduced systolic function. The tricuspid regurgitant velocity is  2.82 m/s, and with an assumed right atrial  pressure of 15 mmHg, the estimated right ventricular systolic pressure is    moderately elevated at 46.8 mmHg.   Left Atrium: Left atrial size was mildly dilated.   Right Atrium: Right atrial size was normal in size   Pericardium: There is no evidence of pericardial effusion.   Mitral Valve: The mitral valve is grossly normal. Trivial mitral valve  regurgitation. MV peak gradient, 5.7 mmHg.   Tricuspid Valve: The tricuspid valve is normal in structure. Tricuspid  valve regurgitation is not demonstrated.   Aortic Valve: The aortic valve is abnormal. Aortic valve regurgitation is  not visualized. Severe aortic stenosis is present. Aortic valve mean  gradient measures 22.0 mmHg. Aortic valve peak gradient measures 41.5  mmHg. Aortic valve area, by VTI measures  0.65 cm. Aortic valve DVI is 0.14. Low flow low gradient severe aortic  stenosis.   Pulmonic Valve: The pulmonic valve was not well visualized. Pulmonic valve  regurgitation is not visualized. Pulmonic regurgitation is not visualized.   Aorta: The aortic root is normal in size and structure.   Venous: The inferior vena cava is dilated in size with less than 50%  respiratory variability, suggesting right atrial pressure of 15 mmHg.   IAS/Shunts: No atrial level shunt detected by color flow Doppler.    LEFT VENTRICLE  PLAX 2D  LVIDd: 5.91 cm Diastology  LVIDs: 4.48 cm LV e' lateral: 12.90 cm/s  LV PW: 1.17 cm LV E/e' lateral: 7.3  LV IVS: 1.29 cm LV e' medial: 6.53 cm/s  LVOT diam: 2.30 cm LV E/e' medial: 14.4  LV SV: 82 ml  LV SV Index: 34.50  LVOT Area: 4.15 cm    RIGHT VENTRICLE  RV Basal diam: 4.74 cm   LEFT ATRIUM Index RIGHT ATRIUM Index  LA diam: 4.30 cm 1.86 cm/m RA Area: 25.40 cm  LA Vol (A2C): 89.6 ml 38.85 ml/m RA Volume: 72.40 ml  31.39 ml/m  LA Vol (A4C): 87.1 ml 37.77 ml/m  LA Biplane Vol: 90.5 ml 39.24 ml/m  AORTIC VALVE PULMONIC VALVE  AV Area (Vmax): 0.82 cm PV Vmax: 1.19 m/s  AV Area (Vmean): 0.66 cm PV Vmean: 79.800 cm/s  AV Area (VTI): 0.65 cm PV VTI:  0.213 m  AV Vmax: 322.00 cm/s PV Peak grad: 5.7 mmHg  AV Vmean: 215.000 cm/s PV Mean grad: 3.0 mmHg  AV VTI: 0.592 m  AV Peak Grad: 41.5 mmHg  AV Mean Grad: 22.0 mmHg  LVOT Vmax: 63.50 cm/s  LVOT Vmean: 34.000 cm/s  LVOT VTI: 0.093 m  LVOT/AV VTI ratio: 0.16   AORTA  Ao Root diam: 3.50 cm   MITRAL VALVE TRICUSPID VALVE  MV Area (PHT): 4.83 cm TR Peak grad: 31.8 mmHg  MV Peak grad: 5.7 mmHg TR Vmax: 287.00 cm/s  MV Mean grad: 2.0 mmHg  MV Vmax: 1.19 m/s SHUNTS  MV Vmean: 67.3 cm/s Systemic VTI: 0.09 m  MV VTI: 0.22 m Systemic Diam: 2.30 cm  MV PHT: 45.53 msec  MV Decel Time: 157 msec  MV E velocity: 93.80 cm/s 103 cm/s    Kate Sable MD  Electronically signed by Kate Sable MD  Signature Date/Time: 08/14/2019/2:01:47 PM  TRANSESOPHOGEAL ECHO REPORT     Patient Name: [name] Date of Exam: 08/19/2019  Medical Rec #: [id number] Height: 72.0 in  Accession #: [id number] Weight: 289.0 lb  Date of Birth: Feb 23, 1928 BSA: 2.49 m  Patient Age: 84 years BP: 132/97 mmHg  Patient Gender: M HR: 114 bpm.  Exam Location: [facility]    Procedure: Color Doppler, Cardiac Doppler, Saline Contrast Bubble Study  and  Transesophageal Echo   Indications: I48.91 Atrial fibrillation   History: Patient has prior history of Echocardiogram examinations,  most  recent 08/14/2019.   Sonographer: Charmayne Sheer RDCS (AE)  Referring Phys: [id number] Rise Mu  Diagnosing Phys: Ida Rogue MD     PROCEDURE: Consent was requested emergently by emergency room physicain.  Local oropharyngeal anesthetic was provided with Benzocaine spray and  viscous lidocaine. The transesophogeal probe was passed through the  esophogus of the patient. Image quality was  excellent. The patient's vital signs; including heart rate, blood  pressure, and oxygen saturation; remained stable throughout the procedure.  The patient developed no complications during the procedure.   IMPRESSIONS    1. Left  ventricular ejection fraction, by visual estimation, is 25 to  30%. The left ventricle has severely decreased function. There is no left  ventricular hypertrophy.  2. The left ventricle demonstrates global hypokinesis.  3. Global right ventricle has moderately reduced systolic function.The  right ventricular size is mildly enlarged. No increase in right  ventricular wall thickness.  4. Left atrial size was moderately dilated.  5. Right atrial size was moderately dilated.  6. Severe aortic valve stenosis.Successful cardioversion after TEE for  persistent atrial fibrillation. NSR was restored.   FINDINGS  Left Ventricle: Left ventricular ejection fraction, by visual estimation,  is 25 to 30%. The left ventricle has severely decreased function. The left  ventricle demonstrates global hypokinesis. There is no left ventricular  hypertrophy. Normal left atrial  pressure.   Right Ventricle: The right ventricular size is mildly enlarged. No  increase in right ventricular wall thickness. Global RV systolic function  is has moderately reduced systolic function.   Left Atrium: Left atrial size was moderately dilated.   Right Atrium: Right atrial size was moderately dilated   Pericardium: There is  no evidence of pericardial effusion.   Mitral Valve: The mitral valve is normal in structure. No evidence of  mitral valve stenosis by observation. Mild to moderate mitral valve  regurgitation.   Tricuspid Valve: The tricuspid valve is normal in structure. Tricuspid  valve regurgitation is mild.   Aortic Valve: The aortic valve is normal in structure. Aortic valve  regurgitation is not visualized. Severe aortic stenosis is present.   Pulmonic Valve: The pulmonic valve was normal in structure. Pulmonic valve  regurgitation is not visualized.   Aorta: The aortic root, ascending aorta and aortic arch are all  structurally normal, with no evidence of dilitation or obstruction.   Venous: The  inferior vena cava is normal in size with greater than 50%  respiratory variability, suggesting right atrial pressure of 3 mmHg.   Shunts: Agitated saline contrast was given intravenously to evaluate for  intracardiac shunting. Saline contrast bubble study was negative, with no  evidence of any interatrial shunt. There is no evidence of a patent  foramen ovale. No ventricular septal  defect is seen or detected. There is no evidence of an atrial septal  defect. No atrial level shunt detected by color flow Doppler.    Ida Rogue MD  Electronically signed by Ida Rogue MD  Signature Date/Time: 08/19/2019/6:36:03 PM     Panel Physicians Referring Physician Case Authorizing Physician  Burnell Blanks, MD (Primary)       Procedures  RIGHT/LEFT HEART CATH AND CORONARY ANGIOGRAPHY     Conclusion  Prox RCA to Mid RCA lesion is 20% stenosed.  Ramus lesion is 50% stenosed.  Prox LAD to Mid LAD lesion is 40% stenosed. 1. Mild to moderate non-obstructive disease in the proximal and mid LAD, mid intermediate branch.  2. Mild disease in the mid segment of the large dominant RCA  3. Severe aortic stenosis by echo (cath data: mean gradient 25.7 mmHg, peak to peak gradient 20 mmHg)  Recommendations: Medical management of mild CAD. Continue workup for TAVR.      Recommendations  Antiplatelet/Anticoag Medical management of mild CAD. Continue workup for TAVR.        Indications  Severe aortic stenosis [I35.0 (ICD-10-CM)]     Procedural Details  Technical Details Indication: 86 yo male with severe AS, known mild CAD, workup for TAVR  Procedure: The risks, benefits, complications, treatment options, and expected outcomes were discussed with the patient. The patient and/or family concurred with the proposed plan, giving informed consent. The patient was brought to the cath lab after IV hydration was given. The patient was sedated with Versed and Fentanyl. The IV catheter present  in the right antecubital vein was changed for a 5 [place] sheath. Right heart catheterization performed with a balloon tipped catheter. The right wrist was prepped and draped in a sterile fashion. 1% lidocaine was used for local anesthesia. Using the modified Seldinger access technique, a 5 French sheath was placed in the right radial artery. 3 mg Verapamil was given through the sheath. 5000 units IV heparin was given. Standard diagnostic catheters were used to perform selective coronary angiography. The aortic valve was crossed with an AL-1 and the J wire. LV pressures measured. No LV gram performed. The sheath was removed from the right radial artery and a Terumo hemostasis band was applied at the arteriotomy site on the right wrist.    Estimated blood loss <50 mL.   During this procedure medications were administered to achieve and maintain moderate conscious sedation while the patient's  heart rate, blood pressure, and oxygen saturation were continuously monitored and I was present face-to-face 100% of this time.     Medications  (Filter: Administrations occurring from 09/23/19 1316 to 09/23/19 1421)  Heparin (Porcine) in NaCl 1000-0.9 UT/500ML-% SOLN (mL)  Total volume: 1,000 mL  Date/Time   Rate/Dose/Volume Action  09/23/19 1336  500 mL Given  1336  500 mL Given  midazolam (VERSED) injection (mg)  Total dose: 1 mg  Date/Time   Rate/Dose/Volume Action  09/23/19 1336  1 mg Given  fentaNYL (SUBLIMAZE) injection (mcg)  Total dose: 25 mcg  Date/Time   Rate/Dose/Volume Action  09/23/19 1337  25 mcg Given  lidocaine (PF) (XYLOCAINE) 1 % injection (mL)  Total volume: 4 mL  Date/Time   Rate/Dose/Volume Action  09/23/19 1346  2 mL Given  1346  2 mL Given  Radial Cocktail/Verapamil only (mL)  Total volume: 10 mL  Date/Time   Rate/Dose/Volume Action  09/23/19 1350  10 mL Given  heparin injection (Units)  Total dose: 5,000 Units  Date/Time   Rate/Dose/Volume Action  09/23/19 1355   5,000 Units Given  hydrALAZINE (APRESOLINE) injection (mg)  Total dose: 10 mg  Date/Time   Rate/Dose/Volume Action  09/23/19 1413  10 mg Given  iohexol (OMNIPAQUE) 350 MG/ML injection (mL)  Total volume: 45 mL  Date/Time   Rate/Dose/Volume Action  09/23/19 1413  45 mL Given     Sedation Time  Sedation Time Physician-1: 30 minutes 47 seconds        Contrast  Medication Name Total Dose  iohexol (OMNIPAQUE) 350 MG/ML injection 45 mL     Radiation/Fluoro  Fluoro time: 5.3 (min)  DAP: 17013 (mGycm2)  Cumulative Air Kerma: [id number] (mGy)     Complications     Complications documented before study signed (09/23/2019 2:21 PM)  RIGHT/LEFT HEART CATH AND CORONARY ANGIOGRAPHY  None Documented by Burnell Blanks, MD 09/23/2019 2:18 PM  Date Found: 09/23/2019  Time Range: Intraprocedure       Coronary Findings  Diagnostic  Dominance: Right  Left Anterior Descending  Vessel is large.  Prox LAD to Mid LAD lesion 40% stenosed  Prox LAD to Mid LAD lesion is 40% stenosed.   Ramus Intermedius  Vessel is moderate in size.  Ramus lesion 50% stenosed  Ramus lesion is 50% stenosed.   Right Coronary Artery  Vessel is large.  Prox RCA to Mid RCA lesion 20% stenosed  Prox RCA to Mid RCA lesion is 20% stenosed.  Intervention  No interventions have been documented.                            Coronary Diagrams  Diagnostic  Dominance: Right  &&&&&  Intervention       Implants  No implant documentation for this case.      [employer] Images Link to Procedure Log  Show images for CARDIAC CATHETERIZATION Procedure Log     Images on Long Term Storage   Show images for Chucky, Vanwagoner            Hemo Data    Most Recent Value  Fick Cardiac Output 5.62 L/min  Fick Cardiac Output Index 2.47 (L/min)/BSA  RA A Wave 10 mmHg  RA V Wave 13 mmHg  RA Mean 4 mmHg  RV Systolic Pressure 38 mmHg  RV Diastolic Pressure 2 mmHg  RV EDP 5 mmHg  PA Systolic Pressure  38 mmHg  PA Diastolic Pressure 9  mmHg  PA Mean 22 mmHg  PW A Wave 18 mmHg  PW V Wave 16 mmHg  PW Mean 11 mmHg  AO Systolic Pressure [id number] mmHg  AO Diastolic Pressure 47 mmHg  AO Mean 77 mmHg  LV Systolic Pressure [id number] mmHg  LV Diastolic Pressure 12 mmHg  LV EDP 21 mmHg  AOp Systolic Pressure [id number] mmHg  AOp Diastolic Pressure 64 mmHg  AOp Mean Pressure 99 mmHg  LVp Systolic Pressure 0000000 mmHg  LVp Diastolic Pressure 14 mmHg  LVp EDP Pressure 21 mmHg  QP/QS 1  TPVR Index 8.91 HRUI  TSVR Index 32.8 HRUI  PVR SVR Ratio 0.14  TPVR/TSVR Ratio 0.27    ADDENDUM REPORT: 10/02/2019 20:02  CLINICAL DATA: 86 year old male with severe aortic stenosis being  evaluated for a TAVR procedure.  EXAM:  Cardiac TAVR CT  TECHNIQUE:  The patient was scanned on a [employer]. A 120 kV  retrospective scan was triggered in the descending thoracic aorta at  111 HU's. Gantry rotation speed was 250 msecs and collimation was .6  mm. No beta blockade or nitro were given. The 3D data set was  reconstructed in 5% intervals of the R-R cycle. Systolic and  diastolic phases were analyzed on a dedicated work station using  MPR, MIP and VRT modes. The patient received 80 cc of contrast.  FINDINGS:  Aortic Valve: Trileaflet aortic valve with severely calcified  leaflets and moderate calcifications extending into the LVOT under  the non-coronary sinus.  Aorta: Mild ascending aortic aneurysm with maximum diameter 41 mm,  mild diffuse atherosclerotic plaque and calcifications.  Sinotubular Junction: 33 x 33 mm  Ascending Thoracic Aorta: 41 x 40 mm  Aortic Arch: 32 x 31 mm  Descending Thoracic Aorta: 30 x 28 mm  Sinus of Valsalva Measurements:  Non-coronary: 36 mm  Right -coronary: 35 mm  Left -coronary: 35 mm  Coronary Artery Height above Annulus:  Left Main: 13 mm  Right Coronary: 19 mm  Virtual Basal Annulus Measurements:  Maximum/Minimum Diameter: 30.4 x 25.3 mm  Mean Diameter: 26.4 mm    Perimeter: 86.4 mm  Area: 549 mm2  Optimum Fluoroscopic Angle for Delivery: LAO 6 CAU 6  IMPRESSION:  1. Trileaflet aortic valve with severely calcified leaflets and  moderate calcifications extending into the LVOT under the  non-coronary sinus. Aortic valve calcium score is 4445 consistent  with severe aortic stenosis. Annular measurements suitable for  delivery of a 29 mm Edwards-SAPIEN 3 valve.  2. Sufficient coronary to annulus distance.  3. Optimum Fluoroscopic Angle for Delivery: LAO 6 CAU 6.  4. No thrombus in the left atrial appendage.  Electronically Signed  By: Ena Dawley  On: 10/02/2019 20:02  CLINICAL DATA: Severe aortic stenosis. Pre-TAVR planning.  EXAM:  CT ANGIOGRAPHY CHEST, ABDOMEN AND PELVIS  TECHNIQUE:  Multidetector CT imaging through the chest, abdomen and pelvis was  performed using the standard protocol during bolus administration of  intravenous contrast. Multiplanar reconstructed images and MIPs were  obtained and reviewed to evaluate the vascular anatomy.  CONTRAST: 38mL OMNIPAQUE IOHEXOL 350 MG/ML SOLN  COMPARISON: 10/30/2018 chest CT angiogram.  FINDINGS:  CTA CHEST FINDINGS  Cardiovascular: Mild cardiomegaly. No significant pericardial  effusion/thickening. Diffuse thickening and coarse calcification of  the aortic valve. Left anterior descending and right coronary  atherosclerosis. Atherosclerotic thoracic aorta with ectatic 4.3 cm  ascending thoracic aorta. Normal caliber main pulmonary artery. No  central pulmonary emboli.  Mediastinum/Nodes: Subcentimeter hypodense right thyroid nodules are  not  appreciably changed and require no follow-up. Unremarkable  esophagus. No pathologically enlarged axillary, mediastinal or hilar  lymph nodes.  Lungs/Pleura: No pneumothorax. No pleural effusion. No acute  consolidative airspace disease, lung masses or significant pulmonary  nodules. Nonspecific patchy subpleural reticulation and ground-glass   opacity at both lung bases, not substantially changed since  10/30/2018 CT.  Musculoskeletal: No aggressive appearing focal osseous lesions.  CTA ABDOMEN AND PELVIS FINDINGS  Hepatobiliary: Normal liver size. Simple 2.0 cm anterior left liver  cyst. Scattered subcentimeter hypodense left liver lesions are too  small to characterize and unchanged from 10/30/2018 CT, considered  benign. Normal gallbladder with no radiopaque cholelithiasis. No  biliary ductal dilatation.  Pancreas: Stable splenule at the pancreatic tail measuring 3.0 x 2.2  cm. Otherwise normal pancreas with no additional pancreatic lesions  or pancreatic duct dilation.  Spleen: Normal size. No mass.  Adrenals/Urinary Tract: Normal adrenals. No hydronephrosis. Soft  tissue density exophytic 3.0 cm renal cortical lesion in the  interpolar left kidney (series 15/image 145), not substantially  changed since 07/04/2013 CT, characterized as benign  hemorrhagic/proteinaceous renal cyst on 07/21/2013 MRI. Exophytic  simple 1.0 cm interpolar left renal cyst. No additional contour  deforming renal lesions. Normal bladder.  Stomach/Bowel: Small hiatal hernia. Otherwise normal nondistended  stomach. Normal caliber small bowel with no small bowel wall  thickening. Candidate normal diminutive appendix. Normal large bowel  with no diverticulosis, large bowel wall thickening or pericolonic  fat stranding.  Vascular/Lymphatic: Atherosclerotic abdominal aorta with 3.4 cm  infrarenal abdominal aortic aneurysm, unchanged using similar  measurement technique. No pathologically enlarged lymph nodes in the  abdomen or pelvis.  Reproductive: Mildly enlarged prostate with nonspecific coarse  internal prostatic calcifications.  Other: No pneumoperitoneum, ascites or focal fluid collection.  Musculoskeletal: No aggressive appearing focal osseous lesions.  Marked lumbar spondylosis.  VASCULAR MEASUREMENTS PERTINENT TO TAVR:  AORTA:  Minimal  Aortic Diameter-18.9 x 15.6 mm  Severity of Aortic Calcification-moderate to severe  RIGHT PELVIS:  Right Common Iliac Artery -  Minimal Diameter-9.5 x 8.7 mm  Tortuosity-mild-to-moderate  Calcification-moderate  Right External Iliac Artery -  Minimal Diameter-8.8 x 8.7 mm  Tortuosity-severe  Calcification-none  Right Common Femoral Artery -  Minimal Diameter-8.5 x 8.4 mm  Tortuosity-mild  Calcification-none  LEFT PELVIS:  Left Common Iliac Artery -  Minimal Diameter-9.5 x 5.9 mm  Tortuosity-mild  Calcification-moderate  Left External Iliac Artery -  Minimal Diameter-8.4 x 8.0 mm  Tortuosity-mild  Calcification-mild  Left Common Femoral Artery -  Minimal Diameter-8.1 x 6.5 mm  Tortuosity-mild  Calcification-moderate  Review of the MIP images confirms the above findings.  IMPRESSION:  1. Vascular findings and measurements pertinent to potential TAVR  procedure, as detailed.  2. Diffuse thickening and coarse calcification of the aortic valve,  compatible with reported history of severe aortic stenosis.  3. Mild cardiomegaly. Two-vessel coronary atherosclerosis.  4. Stable 3.4 cm infrarenal abdominal aortic aneurysm. Abdominal  Aortic Aneurysm (ICD10-I71.9). Recommend follow-up aortic ultrasound  in 3 years. This recommendation follows ACR consensus guidelines:  White Paper of the ACR Incidental Findings Committee II on Vascular  Findings. J Am Coll Radiol 2013; 10:789-794.  5. Small hiatal hernia.  6. Mildly enlarged prostate.  7. Aortic Atherosclerosis (ICD10-I70.0).  Electronically Signed  By: Ilona Sorrel M.D.  On: 10/02/2019 13:42    STS Risk Score:   Risk of Mortality:  3.899%  Renal Failure:  10.498%  Permanent Stroke:  1.495%  Prolonged Ventilation:  17.116%  DSW Infection:  0.170%  Reoperation:  5.169%  Morbidity or Mortality:  26.213%  Short Length of Stay:  8.063%  Long Length of Stay:  18.558%    Impression:   This 86 year old gentleman  has stage D2, severe, symptomatic, low flow, low gradient, reduced ejection fraction aortic valve stenosis currently with New York Heart Association class II symptoms of exertional fatigue and a BNP of 2308 in January 2021 when he was admitted with atrial fibrillation and shortness of breath consistent with acute on chronic combined systolic and diastolic congestive heart failure. I have personally reviewed his 2D echocardiogram, cardiac catheterization, and CTA studies. His echocardiogram shows a severely calcified aortic valve with restricted mobility. The mean gradient is 22 mmHg with a peak gradient of 41.5 mmHg. Dimensionless index is 0.14 with a calculated aortic valve area of 0.65 cm by VTI. Left ventricular ejection fraction was 25 to 30% with global hypokinesis and a left ventricular internal diastolic dimension of [id number] cm. Cardiac catheterization showed mild to moderate nonobstructive coronary disease. I agree that aortic valve replacement is indicated in this gentleman who is still independent and has a reasonable 5-year survival. He is not very active due to his balance issues with vertigo and I suspect that he would be more symptomatic if he could be more active. He was admitted with acute on chronic congestive heart failure in January 2021 when he went into rapid atrial fibrillation and I think it is severe aortic stenosis is not repaired he would likely have a progressive downhill course over the next 1 to 2 years. I do not think he would be a good candidate for open surgical AVR due to his age, reduced ejection fraction, stage III chronic kidney disease, and reduced mobility. I think he would be a reasonable candidate for transcatheter aortic valve replacement. His gated cardiac CTA shows anatomy suitable for transcatheter aortic valve replacement. He does have some calcification extending into the LVOT under the noncoronary sinus which could increase his risk of perivalvular leak in this area. His  abdominal and pelvic CTA shows very tortuous iliac arteries with moderate calcific atherosclerosis. The infrarenal abdominal aorta is mildly aneurysmal at 3.4 cm but also has multiple intraluminal webs suggestive of a chronic dissection.  The left subclavian artery does appear suitable for access.  The patient and his daughter were counseled at length regarding treatment alternatives for management of severe symptomatic aortic stenosis. The risks and benefits of surgical intervention has been discussed in detail. Long-term prognosis with medical therapy was discussed. Alternative approaches such as conventional surgical aortic valve replacement, transcatheter aortic valve replacement, and palliative medical therapy were compared and contrasted at length. This discussion was placed in the context of the patient's own specific clinical presentation and past medical history. All of their questions have been addressed.   Following the decision to proceed with transcatheter aortic valve replacement, a discussion was held regarding what types of management strategies would be attempted intraoperatively in the event of life-threatening complications, including whether or not the patient would be considered a candidate for the use of cardiopulmonary bypass and/or conversion to open sternotomy for attempted surgical intervention. He is 86 years old with stage III chronic kidney disease and I do not think he would be a candidate for emergent sternotomy to manage any intraoperative complications.   The patient has been advised of a variety of complications that might develop including but not limited to risks of death, stroke, paravalvular leak, aortic dissection or other major vascular complications, aortic  annulus rupture, device embolization, cardiac rupture or perforation, mitral regurgitation, acute myocardial infarction, arrhythmia, heart block or bradycardia requiring permanent pacemaker placement, congestive  heart failure, respiratory failure, renal failure, pneumonia, infection, other late complications related to structural valve deterioration or migration, or other complications that might ultimately cause a temporary or permanent loss of functional independence or other long term morbidity. The patient provides full informed consent for the procedure as described and all questions were answered.   Plan:   Left subclavian transcatheter aortic valve replacement.   Gaye Pollack, MD

## 2019-10-27 ENCOUNTER — Encounter (HOSPITAL_COMMUNITY)
Admission: RE | Disposition: A | Discharge status: Home / Self Care | Payer: Self-pay | Source: Home / Self Care | Attending: Surgery

## 2019-10-27 ENCOUNTER — Ambulatory Visit: Payer: Medicare Other | Admitting: *Deleted

## 2019-10-27 ENCOUNTER — Inpatient Hospital Stay (HOSPITAL_COMMUNITY)
Admission: RE | Admit: 2019-10-27 | Discharge: 2019-10-28 | DRG: 266 | Disposition: A | Discharge status: Home / Self Care | Payer: Medicare Other | Attending: Surgery | Admitting: Surgery

## 2019-10-27 ENCOUNTER — Other Ambulatory Visit: Payer: Self-pay | Admitting: Physician Assistant

## 2019-10-27 ENCOUNTER — Inpatient Hospital Stay (HOSPITAL_COMMUNITY): Payer: Medicare Other

## 2019-10-27 ENCOUNTER — Encounter (HOSPITAL_COMMUNITY): Payer: Self-pay | Admitting: Cardiovascular Disease

## 2019-10-27 ENCOUNTER — Inpatient Hospital Stay (HOSPITAL_COMMUNITY): Payer: Medicare Other | Admitting: Emergency Medicine

## 2019-10-27 ENCOUNTER — Inpatient Hospital Stay (HOSPITAL_COMMUNITY): Payer: Medicare Other | Admitting: Certified Registered Nurse Anesthetist

## 2019-10-27 ENCOUNTER — Other Ambulatory Visit: Payer: Self-pay

## 2019-10-27 DIAGNOSIS — I083 Combined rheumatic disorders of mitral, aortic and tricuspid valves: Principal | ICD-10-CM | POA: Diagnosis present

## 2019-10-27 DIAGNOSIS — Z006 Encounter for examination for normal comparison and control in clinical research program: Secondary | ICD-10-CM | POA: Diagnosis not present

## 2019-10-27 DIAGNOSIS — Z79899 Other long term (current) drug therapy: Secondary | ICD-10-CM

## 2019-10-27 DIAGNOSIS — Z87891 Personal history of nicotine dependence: Secondary | ICD-10-CM | POA: Diagnosis not present

## 2019-10-27 DIAGNOSIS — Z7982 Long term (current) use of aspirin: Secondary | ICD-10-CM | POA: Diagnosis not present

## 2019-10-27 DIAGNOSIS — Z8249 Family history of ischemic heart disease and other diseases of the circulatory system: Secondary | ICD-10-CM

## 2019-10-27 DIAGNOSIS — I130 Hypertensive heart and chronic kidney disease with heart failure and stage 1 through stage 4 chronic kidney disease, or unspecified chronic kidney disease: Secondary | ICD-10-CM | POA: Diagnosis present

## 2019-10-27 DIAGNOSIS — Z6832 Body mass index (BMI) 32.0-32.9, adult: Secondary | ICD-10-CM

## 2019-10-27 DIAGNOSIS — I350 Nonrheumatic aortic (valve) stenosis: Secondary | ICD-10-CM

## 2019-10-27 DIAGNOSIS — E669 Obesity, unspecified: Secondary | ICD-10-CM | POA: Diagnosis present

## 2019-10-27 DIAGNOSIS — R2681 Unsteadiness on feet: Secondary | ICD-10-CM | POA: Diagnosis present

## 2019-10-27 DIAGNOSIS — I714 Abdominal aortic aneurysm, without rupture, unspecified: Secondary | ICD-10-CM | POA: Diagnosis present

## 2019-10-27 DIAGNOSIS — I5023 Acute on chronic systolic (congestive) heart failure: Secondary | ICD-10-CM | POA: Diagnosis not present

## 2019-10-27 DIAGNOSIS — R310 Gross hematuria: Secondary | ICD-10-CM | POA: Diagnosis not present

## 2019-10-27 DIAGNOSIS — I5043 Acute on chronic combined systolic (congestive) and diastolic (congestive) heart failure: Secondary | ICD-10-CM | POA: Diagnosis present

## 2019-10-27 DIAGNOSIS — R011 Cardiac murmur, unspecified: Secondary | ICD-10-CM | POA: Diagnosis present

## 2019-10-27 DIAGNOSIS — Z952 Presence of prosthetic heart valve: Secondary | ICD-10-CM

## 2019-10-27 DIAGNOSIS — I447 Left bundle-branch block, unspecified: Secondary | ICD-10-CM

## 2019-10-27 DIAGNOSIS — I10 Essential (primary) hypertension: Secondary | ICD-10-CM | POA: Diagnosis present

## 2019-10-27 DIAGNOSIS — N183 Chronic kidney disease, stage 3 unspecified: Secondary | ICD-10-CM | POA: Diagnosis present

## 2019-10-27 DIAGNOSIS — Q211 Atrial septal defect: Secondary | ICD-10-CM | POA: Diagnosis not present

## 2019-10-27 DIAGNOSIS — I255 Ischemic cardiomyopathy: Secondary | ICD-10-CM | POA: Diagnosis present

## 2019-10-27 DIAGNOSIS — I2510 Atherosclerotic heart disease of native coronary artery without angina pectoris: Secondary | ICD-10-CM | POA: Diagnosis present

## 2019-10-27 DIAGNOSIS — R338 Other retention of urine: Secondary | ICD-10-CM | POA: Diagnosis present

## 2019-10-27 DIAGNOSIS — I493 Ventricular premature depolarization: Secondary | ICD-10-CM | POA: Diagnosis present

## 2019-10-27 DIAGNOSIS — Z7901 Long term (current) use of anticoagulants: Secondary | ICD-10-CM

## 2019-10-27 DIAGNOSIS — R2689 Other abnormalities of gait and mobility: Secondary | ICD-10-CM | POA: Diagnosis present

## 2019-10-27 DIAGNOSIS — I4819 Other persistent atrial fibrillation: Secondary | ICD-10-CM | POA: Diagnosis present

## 2019-10-27 DIAGNOSIS — M1990 Unspecified osteoarthritis, unspecified site: Secondary | ICD-10-CM | POA: Diagnosis present

## 2019-10-27 DIAGNOSIS — Z954 Presence of other heart-valve replacement: Secondary | ICD-10-CM | POA: Diagnosis not present

## 2019-10-27 DIAGNOSIS — I712 Thoracic aortic aneurysm, without rupture: Secondary | ICD-10-CM | POA: Diagnosis present

## 2019-10-27 DIAGNOSIS — Z9181 History of falling: Secondary | ICD-10-CM

## 2019-10-27 HISTORY — DX: Abdominal aortic aneurysm, without rupture, unspecified: I71.40

## 2019-10-27 HISTORY — DX: Abdominal aortic aneurysm, without rupture: I71.4

## 2019-10-27 HISTORY — PX: TEE WITHOUT CARDIOVERSION: SHX5443

## 2019-10-27 HISTORY — DX: Presence of prosthetic heart valve: Z95.2

## 2019-10-27 HISTORY — PX: CARDIAC VALVE REPLACEMENT: SHX585

## 2019-10-27 LAB — POCT I-STAT, CHEM 8
BUN: 24 mg/dL — ABNORMAL HIGH (ref 8–23)
BUN: 23 mg/dL (ref 8–23)
BUN: 22 mg/dL (ref 8–23)
Calcium, Ion: 1.31 mmol/L (ref 1.15–1.40)
Calcium, Ion: 1.29 mmol/L (ref 1.15–1.40)
Calcium, Ion: 1.29 mmol/L (ref 1.15–1.40)
Chloride: 104 mmol/L (ref 98–111)
Chloride: 105 mmol/L (ref 98–111)
Chloride: 105 mmol/L (ref 98–111)
Creatinine, Ser: 1.2 mg/dL (ref 0.61–1.24)
Creatinine, Ser: 1.2 mg/dL (ref 0.61–1.24)
Creatinine, Ser: 1.2 mg/dL (ref 0.61–1.24)
Glucose, Bld: 105 mg/dL — ABNORMAL HIGH (ref 70–99)
Glucose, Bld: 107 mg/dL — ABNORMAL HIGH (ref 70–99)
Glucose, Bld: 133 mg/dL — ABNORMAL HIGH (ref 70–99)
HCT: 35 % — ABNORMAL LOW (ref 39.0–52.0)
HCT: 33 % — ABNORMAL LOW (ref 39.0–52.0)
HCT: 39 % (ref 39.0–52.0)
Hemoglobin: 11.9 g/dL — ABNORMAL LOW (ref 13.0–17.0)
Hemoglobin: 11.2 g/dL — ABNORMAL LOW (ref 13.0–17.0)
Hemoglobin: 13.3 g/dL (ref 13.0–17.0)
Potassium: 3.8 mmol/L (ref 3.5–5.1)
Potassium: 3.7 mmol/L (ref 3.5–5.1)
Potassium: 3.6 mmol/L (ref 3.5–5.1)
Sodium: 141 mmol/L (ref 135–145)
Sodium: 140 mmol/L (ref 135–145)
Sodium: 141 mmol/L (ref 135–145)
TCO2: 29 mmol/L (ref 22–32)
TCO2: 27 mmol/L (ref 22–32)
TCO2: 26 mmol/L (ref 22–32)

## 2019-10-27 SURGERY — IMPLANTATION, AORTIC VALVE, TRANSCATHETER, SUBCLAVIAN ARTERY APPROACH
Anesthesia: General | Site: Chest

## 2019-10-27 MED ORDER — CHLORHEXIDINE GLUCONATE CLOTH 2 % EX PADS
6.0000 | MEDICATED_PAD | Freq: Every day | CUTANEOUS | Status: DC
  Administered 2019-10-27 – 2019-10-28 (×2): 6 via TOPICAL

## 2019-10-27 MED ORDER — SODIUM CHLORIDE 0.9% FLUSH
3.0000 mL | Freq: Two times a day (BID) | INTRAVENOUS | Status: DC
  Administered 2019-10-27 – 2019-10-28 (×2): 3 mL via INTRAVENOUS

## 2019-10-27 MED ORDER — HEPARIN SODIUM (PORCINE) 1000 UNIT/ML IJ SOLN
INTRAMUSCULAR | Status: DC | PRN
  Administered 2019-10-27: 15000 [IU] via INTRAVENOUS

## 2019-10-27 MED ORDER — SODIUM CHLORIDE 0.9% FLUSH: 3.0000 mL | INTRAVENOUS | Status: DC | PRN

## 2019-10-27 MED ORDER — AMIODARONE IV BOLUS ONLY 150 MG/100ML
INTRAVENOUS | Status: DC | PRN
  Administered 2019-10-27: 150 mg via INTRAVENOUS

## 2019-10-27 MED ORDER — OXYCODONE HCL 5 MG PO TABS: 5.0000 mg | ORAL_TABLET | ORAL | Status: DC | PRN

## 2019-10-27 MED ORDER — CHLORHEXIDINE GLUCONATE 4 % EX LIQD: 30.0000 mL | CUTANEOUS | Status: DC

## 2019-10-27 MED ORDER — METOPROLOL SUCCINATE ER 50 MG PO TB24
50.0000 mg | ORAL_TABLET | Freq: Two times a day (BID) | ORAL | Status: DC
  Administered 2019-10-27 – 2019-10-28 (×2): 50 mg via ORAL
  Filled 2019-10-27 (×3): qty 1

## 2019-10-27 MED ORDER — SODIUM CHLORIDE 0.9 % IV SOLN
INTRAVENOUS | Status: AC
  Filled 2019-10-27 (×3): qty 1.2

## 2019-10-27 MED ORDER — CHLORHEXIDINE GLUCONATE 0.12 % MT SOLN: 15.0000 mL | Freq: Once | OROMUCOSAL | Status: DC

## 2019-10-27 MED ORDER — ROCURONIUM BROMIDE 10 MG/ML (PF) SYRINGE
PREFILLED_SYRINGE | INTRAVENOUS | Status: AC
  Filled 2019-10-27: qty 10

## 2019-10-27 MED ORDER — VANCOMYCIN HCL IN DEXTROSE 1-5 GM/200ML-% IV SOLN
1000.0000 mg | Freq: Once | INTRAVENOUS | Status: AC
  Administered 2019-10-27: 1000 mg via INTRAVENOUS
  Filled 2019-10-27: qty 200

## 2019-10-27 MED ORDER — NITROGLYCERIN IN D5W 200-5 MCG/ML-% IV SOLN: 0.0000 ug/min | INTRAVENOUS | Status: DC

## 2019-10-27 MED ORDER — LACTATED RINGERS IV SOLN: INTRAVENOUS | Status: DC

## 2019-10-27 MED ORDER — EPHEDRINE SULFATE-NACL 50-0.9 MG/10ML-% IV SOSY
PREFILLED_SYRINGE | INTRAVENOUS | Status: DC | PRN
  Administered 2019-10-27: 15 mg via INTRAVENOUS
  Administered 2019-10-27: 5 mg via INTRAVENOUS
  Administered 2019-10-27: 10 mg via INTRAVENOUS

## 2019-10-27 MED ORDER — HYDRALAZINE HCL 20 MG/ML IJ SOLN
INTRAMUSCULAR | Status: AC
  Administered 2019-10-27: 10 mg via INTRAVENOUS
  Filled 2019-10-27: qty 1

## 2019-10-27 MED ORDER — SODIUM CHLORIDE 0.9 % IV SOLN: 250.0000 mL | INTRAVENOUS | Status: DC | PRN

## 2019-10-27 MED ORDER — FENTANYL CITRATE (PF) 250 MCG/5ML IJ SOLN
INTRAMUSCULAR | Status: AC
  Filled 2019-10-27: qty 5

## 2019-10-27 MED ORDER — TRAMADOL HCL 50 MG PO TABS: 50.0000 mg | ORAL_TABLET | ORAL | Status: DC | PRN

## 2019-10-27 MED ORDER — CHLORHEXIDINE GLUCONATE 0.12 % MT SOLN
OROMUCOSAL | Status: AC
  Administered 2019-10-27: 15 mL
  Filled 2019-10-27: qty 15

## 2019-10-27 MED ORDER — ONDANSETRON HCL 4 MG/2ML IJ SOLN
INTRAMUSCULAR | Status: DC | PRN
  Administered 2019-10-27: 4 mg via INTRAVENOUS

## 2019-10-27 MED ORDER — MAGNESIUM SULFATE 50 % IJ SOLN
INTRAMUSCULAR | Status: DC | PRN
  Administered 2019-10-27: 1 g via INTRAVENOUS

## 2019-10-27 MED ORDER — HYDRALAZINE HCL 20 MG/ML IJ SOLN
10.0000 mg | Freq: Once | INTRAMUSCULAR | Status: AC
  Administered 2019-10-27: 10 mg via INTRAVENOUS

## 2019-10-27 MED ORDER — SODIUM CHLORIDE 0.9 % IV SOLN
1.5000 g | Freq: Two times a day (BID) | INTRAVENOUS | Status: DC
  Administered 2019-10-28: 1.5 g via INTRAVENOUS
  Filled 2019-10-27 (×4): qty 1.5

## 2019-10-27 MED ORDER — CHLORHEXIDINE GLUCONATE 4 % EX LIQD: 60.0000 mL | Freq: Once | CUTANEOUS | Status: DC

## 2019-10-27 MED ORDER — LIDOCAINE 2% (20 MG/ML) 5 ML SYRINGE
INTRAMUSCULAR | Status: DC | PRN
  Administered 2019-10-27: 100 mg via INTRAVENOUS
  Administered 2019-10-27: 80 mg via INTRAVENOUS

## 2019-10-27 MED ORDER — PHENYLEPHRINE HCL-NACL 20-0.9 MG/250ML-% IV SOLN: 0.0000 ug/min | INTRAVENOUS | Status: DC

## 2019-10-27 MED ORDER — SODIUM CHLORIDE (PF) 0.9 % IJ SOLN
INTRAVENOUS | Status: DC | PRN
  Administered 2019-10-27: 40 mL via INTRAMUSCULAR

## 2019-10-27 MED ORDER — PROPOFOL 10 MG/ML IV BOLUS
INTRAVENOUS | Status: DC | PRN
  Administered 2019-10-27: 120 mg via INTRAVENOUS

## 2019-10-27 MED ORDER — ACETAMINOPHEN 325 MG PO TABS: 650.0000 mg | ORAL_TABLET | Freq: Four times a day (QID) | ORAL | Status: DC | PRN

## 2019-10-27 MED ORDER — SODIUM CHLORIDE (PF) 0.9 % IJ SOLN
INTRAMUSCULAR | Status: AC
  Filled 2019-10-27: qty 10

## 2019-10-27 MED ORDER — SODIUM CHLORIDE 0.9 % IV SOLN
INTRAVENOUS | Status: DC | PRN
  Administered 2019-10-27: 1500 mL

## 2019-10-27 MED ORDER — PHENYLEPHRINE 40 MCG/ML (10ML) SYRINGE FOR IV PUSH (FOR BLOOD PRESSURE SUPPORT)
PREFILLED_SYRINGE | INTRAVENOUS | Status: AC
  Filled 2019-10-27: qty 10

## 2019-10-27 MED ORDER — NEOSTIGMINE METHYLSULFATE 3 MG/3ML IV SOSY
PREFILLED_SYRINGE | INTRAVENOUS | Status: AC
  Filled 2019-10-27: qty 3

## 2019-10-27 MED ORDER — GLYCOPYRROLATE PF 0.2 MG/ML IJ SOSY
PREFILLED_SYRINGE | INTRAMUSCULAR | Status: AC
  Filled 2019-10-27: qty 1

## 2019-10-27 MED ORDER — EPHEDRINE 5 MG/ML INJ
INTRAVENOUS | Status: AC
  Filled 2019-10-27: qty 10

## 2019-10-27 MED ORDER — SUGAMMADEX SODIUM 200 MG/2ML IV SOLN
INTRAVENOUS | Status: DC | PRN
  Administered 2019-10-27: 200 mg via INTRAVENOUS

## 2019-10-27 MED ORDER — LIDOCAINE 2% (20 MG/ML) 5 ML SYRINGE
INTRAMUSCULAR | Status: AC
  Filled 2019-10-27: qty 5

## 2019-10-27 MED ORDER — ROCURONIUM BROMIDE 10 MG/ML (PF) SYRINGE
PREFILLED_SYRINGE | INTRAVENOUS | Status: DC | PRN
  Administered 2019-10-27 (×2): 50 mg via INTRAVENOUS

## 2019-10-27 MED ORDER — SODIUM CHLORIDE 0.9 % IV SOLN: INTRAVENOUS | Status: AC

## 2019-10-27 MED ORDER — 0.9 % SODIUM CHLORIDE (POUR BTL) OPTIME
TOPICAL | Status: DC | PRN
  Administered 2019-10-27: 2000 mL

## 2019-10-27 MED ORDER — ACETAMINOPHEN 650 MG RE SUPP: 650.0000 mg | Freq: Four times a day (QID) | RECTAL | Status: DC | PRN

## 2019-10-27 MED ORDER — ONDANSETRON HCL 4 MG/2ML IJ SOLN
INTRAMUSCULAR | Status: AC
  Filled 2019-10-27: qty 2

## 2019-10-27 MED ORDER — FENTANYL CITRATE (PF) 250 MCG/5ML IJ SOLN
INTRAMUSCULAR | Status: DC | PRN
  Administered 2019-10-27: 50 ug via INTRAVENOUS
  Administered 2019-10-27: 25 ug via INTRAVENOUS
  Administered 2019-10-27: 200 ug via INTRAVENOUS
  Administered 2019-10-27: 50 ug via INTRAVENOUS
  Administered 2019-10-27: 25 ug via INTRAVENOUS

## 2019-10-27 MED ORDER — ESMOLOL HCL 100 MG/10ML IV SOLN
INTRAVENOUS | Status: DC | PRN
  Administered 2019-10-27 (×2): 40 mg via INTRAVENOUS

## 2019-10-27 MED ORDER — SODIUM CHLORIDE 0.9 % IV SOLN: INTRAVENOUS | Status: DC

## 2019-10-27 MED ORDER — HYDRALAZINE HCL 20 MG/ML IJ SOLN: 10.0000 mg | Freq: Once | INTRAMUSCULAR | Status: AC

## 2019-10-27 MED ORDER — ONDANSETRON HCL 4 MG/2ML IJ SOLN: 4.0000 mg | Freq: Four times a day (QID) | INTRAMUSCULAR | Status: DC | PRN

## 2019-10-27 MED ORDER — ARTIFICIAL TEARS OPHTHALMIC OINT
TOPICAL_OINTMENT | OPHTHALMIC | Status: AC
  Filled 2019-10-27: qty 3.5

## 2019-10-27 MED ORDER — MORPHINE SULFATE (PF) 2 MG/ML IV SOLN: 1.0000 mg | INTRAVENOUS | Status: DC | PRN

## 2019-10-27 MED ORDER — SUCCINYLCHOLINE CHLORIDE 200 MG/10ML IV SOSY
PREFILLED_SYRINGE | INTRAVENOUS | Status: AC
  Filled 2019-10-27: qty 10

## 2019-10-27 MED ORDER — ASPIRIN EC 81 MG PO TBEC
81.0000 mg | DELAYED_RELEASE_TABLET | Freq: Every day | ORAL | Status: DC
  Administered 2019-10-28: 81 mg via ORAL
  Filled 2019-10-27: qty 1

## 2019-10-27 MED ORDER — PROPOFOL 10 MG/ML IV BOLUS
INTRAVENOUS | Status: AC
  Filled 2019-10-27: qty 20

## 2019-10-27 SURGICAL SUPPLY — 91 items
BAG DECANTER FOR FLEXI CONT (MISCELLANEOUS) ×2 IMPLANT
BAG SNAP BAND KOVER 36X36 (MISCELLANEOUS) ×4 IMPLANT
BLADE CLIPPER SURG (BLADE) ×2 IMPLANT
BLADE OSCILLATING /SAGITTAL (BLADE) IMPLANT
BLADE STERNUM SYSTEM 6 (BLADE) IMPLANT
CABLE ADAPT CONN TEMP 6FT (ADAPTER) ×4 IMPLANT
CANNULA FEM VENOUS REMOTE 22FR (CANNULA) IMPLANT
CANNULA OPTISITE PERFUSION 16F (CANNULA) IMPLANT
CANNULA OPTISITE PERFUSION 18F (CANNULA) IMPLANT
CATH BEACON 5 .035 40 KMP TP (CATHETERS) IMPLANT
CATH BEACON 5 .038 40 KMP TP (CATHETERS) ×4
CATH DIAG EXPO 6F AL1 (CATHETERS) IMPLANT
CATH DIAG EXPO 6F VENT PIG 145 (CATHETERS) ×8 IMPLANT
CATH EXTERNAL FEMALE PUREWICK (CATHETERS) IMPLANT
CATH INFINITI 6F AL2 (CATHETERS) IMPLANT
CATH S G BIP PACING (CATHETERS) ×4 IMPLANT
CLIP VESOCCLUDE MED 24/CT (CLIP) ×2 IMPLANT
CLIP VESOCCLUDE SM WIDE 24/CT (CLIP) ×2 IMPLANT
CLOSURE MYNX CONTROL 6F/7F (Vascular Products) ×2 IMPLANT
CNTNR URN SCR LID CUP LEK RST (MISCELLANEOUS) ×4 IMPLANT
CONT SPEC 4OZ STRL OR WHT (MISCELLANEOUS) ×12
COVER BACK TABLE 80X110 HD (DRAPES) ×8 IMPLANT
COVER DOME SNAP 22 D (MISCELLANEOUS) IMPLANT
COVER WAND RF STERILE (DRAPES) ×2 IMPLANT
DERMABOND ADVANCED (GAUZE/BANDAGES/DRESSINGS) ×4
DERMABOND ADVANCED .7 DNX12 (GAUZE/BANDAGES/DRESSINGS) ×2 IMPLANT
DRAPE INCISE IOBAN 66X45 STRL (DRAPES) ×4 IMPLANT
DRSG TEGADERM 4X4.75 (GAUZE/BANDAGES/DRESSINGS) ×6 IMPLANT
ELECT CAUTERY BLADE 6.4 (BLADE) IMPLANT
ELECT REM PT RETURN 9FT ADLT (ELECTROSURGICAL) ×4
ELECTRODE REM PT RTRN 9FT ADLT (ELECTROSURGICAL) ×4 IMPLANT
FELT TEFLON 6X6 (MISCELLANEOUS) IMPLANT
FEMORAL VENOUS CANN RAP (CANNULA) IMPLANT
GAUZE SPONGE 2X2 8PLY STRL LF (GAUZE/BANDAGES/DRESSINGS) IMPLANT
GAUZE SPONGE 4X4 12PLY STRL (GAUZE/BANDAGES/DRESSINGS) ×4 IMPLANT
GLOVE BIO SURGEON STRL SZ7.5 (GLOVE) ×4 IMPLANT
GLOVE BIO SURGEON STRL SZ8 (GLOVE) ×2 IMPLANT
GLOVE EUDERMIC 7 POWDERFREE (GLOVE) ×2 IMPLANT
GLOVE ORTHO TXT STRL SZ7.5 (GLOVE) ×2 IMPLANT
GOWN STRL REUS W/ TWL LRG LVL3 (GOWN DISPOSABLE) IMPLANT
GOWN STRL REUS W/ TWL XL LVL3 (GOWN DISPOSABLE) ×2 IMPLANT
GOWN STRL REUS W/TWL LRG LVL3 (GOWN DISPOSABLE) ×20
GOWN STRL REUS W/TWL XL LVL3 (GOWN DISPOSABLE) ×8
GUIDEWIRE SAFE TJ AMPLATZ EXST (WIRE) ×6 IMPLANT
INSERT FOGARTY SM (MISCELLANEOUS) IMPLANT
KIT BASIN OR (CUSTOM PROCEDURE TRAY) ×4 IMPLANT
KIT DILATOR VASC 18G NDL (KITS) IMPLANT
KIT HEART LEFT (KITS) ×4 IMPLANT
KIT SUCTION CATH 14FR (SUCTIONS) ×2 IMPLANT
KIT TURNOVER KIT B (KITS) ×4 IMPLANT
LOOP VESSEL MAXI BLUE (MISCELLANEOUS) ×2 IMPLANT
LOOP VESSEL MINI RED (MISCELLANEOUS) IMPLANT
NDL PERC 18GX7CM (NEEDLE) ×2 IMPLANT
NEEDLE 22X1 1/2 (OR ONLY) (NEEDLE) IMPLANT
NEEDLE PERC 18GX7CM (NEEDLE) ×4 IMPLANT
NS IRRIG 1000ML POUR BTL (IV SOLUTION) ×10 IMPLANT
PACK ENDOVASCULAR (PACKS) ×4 IMPLANT
PAD ARMBOARD 7.5X6 YLW CONV (MISCELLANEOUS) ×8 IMPLANT
PAD ELECT DEFIB RADIOL ZOLL (MISCELLANEOUS) ×4 IMPLANT
PENCIL BUTTON HOLSTER BLD 10FT (ELECTRODE) ×4 IMPLANT
POSITIONER HEAD DONUT 9IN (MISCELLANEOUS) ×4 IMPLANT
SHEATH BRITE TIP 7FR 35CM (SHEATH) ×4 IMPLANT
SHEATH PINNACLE 6F 10CM (SHEATH) ×4 IMPLANT
SHEATH PINNACLE 8F 10CM (SHEATH) ×4 IMPLANT
SLEEVE REPOSITIONING LENGTH 30 (MISCELLANEOUS) ×4 IMPLANT
SPONGE GAUZE 2X2 STER 10/PKG (GAUZE/BANDAGES/DRESSINGS) ×2
SPONGE LAP 4X18 RFD (DISPOSABLE) IMPLANT
STOPCOCK MORSE 400PSI 3WAY (MISCELLANEOUS) ×8 IMPLANT
SUT ETHIBOND X763 2 0 SH 1 (SUTURE) ×2 IMPLANT
SUT GORETEX CV 4 TH 22 36 (SUTURE) ×4 IMPLANT
SUT GORETEX CV4 TH-18 (SUTURE) IMPLANT
SUT MNCRL AB 3-0 PS2 18 (SUTURE) IMPLANT
SUT PROLENE 5 0 C 1 36 (SUTURE) IMPLANT
SUT PROLENE 6 0 C 1 30 (SUTURE) ×6 IMPLANT
SUT SILK  1 MH (SUTURE) ×4
SUT SILK 1 MH (SUTURE) ×2 IMPLANT
SUT SILK 3 0 SH CR/8 (SUTURE) ×2 IMPLANT
SUT VIC AB 2-0 CT1 27 (SUTURE)
SUT VIC AB 2-0 CT1 TAPERPNT 27 (SUTURE) IMPLANT
SUT VIC AB 2-0 CTX 36 (SUTURE) ×2 IMPLANT
SUT VIC AB 3-0 SH 8-18 (SUTURE) ×2 IMPLANT
SYR 50ML LL SCALE MARK (SYRINGE) ×4 IMPLANT
SYR BULB IRRIGATION 50ML (SYRINGE) ×2 IMPLANT
SYR CONTROL 10ML LL (SYRINGE) IMPLANT
TOWEL GREEN STERILE (TOWEL DISPOSABLE) ×4 IMPLANT
TOWEL GREEN STERILE FF (TOWEL DISPOSABLE) ×4 IMPLANT
TRANSDUCER W/STOPCOCK (MISCELLANEOUS) ×8 IMPLANT
TRAY FOLEY SLVR 16FR TEMP STAT (SET/KITS/TRAYS/PACK) IMPLANT
VALVE HRT TRANSCATH CERT 29MM (Valve) ×2 IMPLANT
WIRE EMERALD 3MM-J .035X150CM (WIRE) ×4 IMPLANT
WIRE EMERALD 3MM-J .035X260CM (WIRE) ×4 IMPLANT

## 2019-10-27 NOTE — Anesthesia Procedure Notes (Addendum)
Arterial Line Insertion Start/End3/16/2021 9:10 AM, 10/27/2019 9:25 AM Performed by: Josephine Igo, CRNA, CRNA  Patient location: Pre-op. Preanesthetic checklist: patient identified, IV checked, surgical consent and monitors and equipment checked Lidocaine 1% used for infiltration Right, radial was placed Catheter size: 20 G Hand hygiene performed , maximum sterile barriers used  and Seldinger technique used  Attempts: 1 Procedure performed without using ultrasound guided technique. Following insertion, dressing applied and Biopatch. Post procedure assessment: normal  Patient tolerated the procedure well with no immediate complications.

## 2019-10-27 NOTE — CV Procedure (Signed)
[facility]  TAVR OPERATIVE NOTE   Date of Procedure:  10/27/2019  Preoperative Diagnosis: Severe Aortic Stenosis   Postoperative Diagnosis: Same   Procedure:    Transcatheter Aortic Valve Replacement - Trans Subclavian Artery Approach  Edwards Sapien 3 THV (size 29 mm, model # [id number], serial [id number] )   Co-Surgeons:  Lauree Chandler, MD and Gaye Pollack, MD   Anesthesiologist:  Gifford Shave  Echocardiographer:  Meda Coffee  Pre-operative Echo Findings:  Severe aortic stenosis  Normal left ventricular systolic function  Post-operative Echo Findings:  Mild paravalvular leak  Normal left ventricular systolic function  BRIEF CLINICAL NOTE AND INDICATIONS FOR SURGERY  86 yo male with history of CAD, atrial fibrillation, stage 3 CKD, HTN, PVCs, ischemic cardiomyopathy and severe aortic stenosis who is here today for TAVR. He was admitted to [facility] January 2021 with atrial fib with RVR, nausea and vomiting. Echo January 2021 with LVEF=25-30%. There was severe aortic stenosis. The valve leaflets are thickened and calcified. Mean gradient 22 mmHg, peak gradient 41 mmHg, AVA 0.65 cm2. Dimensionless index 0.14. He is felt to have low flow, low gradient severe AS. He underwent TEE guided cardioversion and was started on Eliquis. Cardiac cath in 2016 with moderate CAD (45% proximal LAD stenosis, 60% intermediate branch stenosis). Cardiac cath with mild non-obstructive disease.   During the course of the patient's preoperative work up they have been evaluated comprehensively by a multidisciplinary team of specialists coordinated through the [facility] in the [facility].  They have been demonstrated to suffer from symptomatic severe aortic stenosis as noted above. The patient has been counseled extensively as to the relative risks and benefits of all options for the treatment of severe aortic stenosis including long term medical  therapy, conventional surgery for aortic valve replacement, and transcatheter aortic valve replacement.  The patient has been independently evaluated by Dr. Cyndia Bent with CT surgery and they are felt to be at high risk for conventional surgical aortic valve replacement. The surgeon indicated the patient would be a poor candidate for conventional surgery. Based upon review of all of the patient's preoperative diagnostic tests they are felt to be candidate for transcatheter aortic valve replacement using the transfemoral approach as an alternative to high risk conventional surgery.    Following the decision to proceed with transcatheter aortic valve replacement, a discussion has been held regarding what types of management strategies would be attempted intraoperatively in the event of life-threatening complications, including whether or not the patient would be considered a candidate for the use of cardiopulmonary bypass and/or conversion to open sternotomy for attempted surgical intervention.  The patient has been advised of a variety of complications that might develop peculiar to this approach including but not limited to risks of death, stroke, paravalvular leak, aortic dissection or other major vascular complications, aortic annulus rupture, device embolization, cardiac rupture or perforation, acute myocardial infarction, arrhythmia, heart block or bradycardia requiring permanent pacemaker placement, congestive heart failure, respiratory failure, renal failure, pneumonia, infection, other late complications related to structural valve deterioration or migration, or other complications that might ultimately cause a temporary or permanent loss of functional independence or other long term morbidity.  The patient provides full informed consent for the procedure as described and all questions were answered preoperatively.   DETAILS OF THE OPERATIVE PROCEDURE  PREPARATION:   The patient is brought to the  operating room on the above mentioned date and central monitoring was established by the  anesthesia team including placement of a radial arterial line. The patient is placed in the supine position on the operating table.  Intravenous antibiotics are administered. General anesthesia is used.   Baseline transesophageal echocardiogram was performed. The patient's chest, abdomen, both groins, and both lower extremities are prepared and draped in a sterile manner. A time out procedure is performed.   PERIPHERAL ACCESS:   Using the modified Seldinger technique, femoral arterial and venous access were obtained with placement of 6 Fr sheaths on the right side using u/s guidance.  A pigtail diagnostic catheter was passed through the femoral arterial sheath under fluoroscopic guidance into the aortic root.  A temporary transvenous pacemaker catheter was passed through the femoral venous sheath under fluoroscopic guidance into the right ventricle.  The pacemaker was tested to ensure stable lead placement and pacemaker capture. Aortic root angiography was performed in order to determine the optimal angiographic angle for valve deployment.  TRANS Subclavian artery ACCESS:  Please see Dr. Vivi Martens note for details of the surgical cutdown.  The patient was heparinized systemically and ACT verified > 250 seconds.  A needle was used to access the artery. A J wire was advanced into the ascending aorta. An 8 French sheath was then placed over the wire. An AL-2 catheter was used to cross the aortic valve with a straight wire. The AL-2 was changed for a pigtail catheter over a J wire. An extra stiff wire was then advanced into the LV through the pigtail catheter. A 16 Fr transfemoral E-sheath was introduced into the aorta via the subclavian artery over the extra stiff wire.    TRANSCATHETER HEART VALVE DEPLOYMENT:  An Edwards Sapien 3 THV (size 29 mm) was prepared and crimped per manufacturer's guidelines, and the proper  orientation of the valve is confirmed on the [employer] delivery system. The valve was advanced through the introducer sheath using normal technique until in an appropriate position in the ascending aorta beyond the sheath tip. The balloon was then retracted and using the fine-tuning wheel was centered on the valve. The valve was then advanced across the aortic arch using appropriate flexion of the catheter. The valve was carefully positioned across the aortic valve annulus. The Commander catheter was retracted using normal technique. Once final position of the valve has been confirmed by angiographic assessment, the valve is deployed while temporarily holding ventilation and during rapid ventricular pacing to maintain systolic blood pressure < 50 mmHg and pulse pressure < 10 mmHg. The balloon inflation is held for >3 seconds after reaching full deployment volume. Once the balloon has fully deflated the balloon is retracted into the ascending aorta and valve function is assessed using TEE. There is felt to be mild paravalvular leak and no central aortic insufficiency.  The patient's hemodynamic recovery following valve deployment is good.  The deployment balloon and guidewire are both removed. Echo demostrated acceptable post-procedural gradients, stable mitral valve function, and mild AI.   PROCEDURE COMPLETION:  The sheath was then removed and Dr. Cyndia Bent closed the arteriotomy. Please see his operative note for details of closure. Protamine was administered once arterial repair was complete. The temporary pacemaker, pigtail catheters and femoral sheaths were removed with placement of a Mynx closure device in the right femoral artery and manual pressure used for venous hemostasis.   The patient tolerated the procedure well and is transported to the recovery area in stable condition. There were no immediate intraoperative complications. All sponge instrument and needle counts are verified correct at  completion of the operation.   No blood products were administered during the operation.  The patient received a total of 40 mL of intravenous contrast during the procedure.  Lauree Chandler MD 10/27/2019 2:16 PM

## 2019-10-27 NOTE — Progress Notes (Signed)
Pt arrived to rm 4E-17 from PACU with a foley cath. Vss, tele initiated, CHG bath performed, assessment performed, call bell within reach.  Lavenia Atlas, RN

## 2019-10-27 NOTE — Anesthesia Preprocedure Evaluation (Signed)
Anesthesia Evaluation  Patient identified by MRN, date of birth, ID band Patient awake    Reviewed: Allergy & Precautions, NPO status , Patient's Chart, lab work & pertinent test results, reviewed documented beta blocker date and time   Airway Mallampati: II  TM Distance: >3 FB Neck ROM: Full    Dental  (+) Dental Advisory Given, Missing, Chipped   Pulmonary former smoker,    Pulmonary exam normal breath sounds clear to auscultation       Cardiovascular hypertension, Pt. on home beta blockers (-) angina+ CAD and +CHF  (-) Past MI and (-) Cardiac Stents + Valvular Problems/Murmurs AS  Rhythm:Regular Rate:Normal + Systolic murmurs    Neuro/Psych TIAnegative psych ROS   GI/Hepatic negative GI ROS, Neg liver ROS,   Endo/Other  Obesity   Renal/GU Renal InsufficiencyRenal disease     Musculoskeletal negative musculoskeletal ROS (+)   Abdominal   Peds  Hematology negative hematology ROS (+)   Anesthesia Other Findings Day of surgery medications reviewed with the patient.  Reproductive/Obstetrics                             Anesthesia Physical Anesthesia Plan  ASA: IV  Anesthesia Plan: General   Post-op Pain Management:    Induction: Intravenous  PONV Risk Score and Plan: 2 and Treatment may vary due to age or medical condition  Airway Management Planned: Oral ETT  Additional Equipment: Arterial line  Intra-op Plan:   Post-operative Plan: Extubation in OR  Informed Consent: I have reviewed the patients History and Physical, chart, labs and discussed the procedure including the risks, benefits and alternatives for the proposed anesthesia with the patient or authorized representative who has indicated his/her understanding and acceptance.     Dental advisory given  Plan Discussed with: CRNA  Anesthesia Plan Comments:         Anesthesia Quick Evaluation

## 2019-10-27 NOTE — Anesthesia Procedure Notes (Signed)
Procedure Name: Intubation Date/Time: 10/27/2019 1:44 AM Performed by: Janace Litten, CRNA Pre-anesthesia Checklist: Patient identified, Emergency Drugs available, Suction available and Patient being monitored Patient Re-evaluated:Patient Re-evaluated prior to induction Oxygen Delivery Method: Circle System Utilized Preoxygenation: Pre-oxygenation with 100% oxygen Induction Type: IV induction Ventilation: Two handed mask ventilation required Laryngoscope Size: Mac and 4 Grade View: Grade I Tube type: Oral Tube size: 7.5 mm Number of attempts: 1 Airway Equipment and Method: Stylet Placement Confirmation: ETT inserted through vocal cords under direct vision,  positive ETCO2 and breath sounds checked- equal and bilateral Secured at: 22 cm Tube secured with: Tape Dental Injury: Teeth and Oropharynx as per pre-operative assessment  Comments: SRNA A Jarrell intubated under CRNA supervision

## 2019-10-27 NOTE — Progress Notes (Signed)
R radial, 20 g catheter was removed, and manual pressure was held for 10 min. R wrist is level zero, and the radial pulse is 3+. Capillary refill is < 3 sec.  Hematuria was also noted in the Foley catheter, and Nell Range PA was notified.

## 2019-10-27 NOTE — Progress Notes (Addendum)
  [facility] TEAM  Patient doing well s/p TAVR. He is hemodynamically stable. BP elevated. HR low 100s in afib. Will restart home Toprol XL 50mg  BID. Groin site and subclavian site are stable. ECG with new LBBB and afib.There is no evidence high grade block. Will transfer to 4E when bed available. Plan for early ambulation after bedrest completed and hopeful discharge over the next 24-48 hours. Pt noted to have frank hematuria and also bleeding outside the foley. Suspect trauma from foley. He is still very sedated and no complaining of pain. When he becomes more alert, we will remove the foley.   Angelena Form PA-C  [facility]  Pager [phone]

## 2019-10-27 NOTE — Transfer of Care (Signed)
Immediate Anesthesia Transfer of Care Note  Patient: Clinton Holmes  Procedure(s) Performed: TRANSCATHETER AORTIC VALVE REPLACEMENT, LEFT SUBCLAVIAN (Left Chest) TRANSESOPHAGEAL ECHOCARDIOGRAM (TEE) (N/A )  Patient Location: PACU and Cath Lab  Anesthesia Type:General  Level of Consciousness: drowsy and responds to stimulation  Airway & Oxygen Therapy: Patient Spontanous Breathing  Post-op Assessment: Report given to RN and Post -op Vital signs reviewed and stable  Post vital signs: Reviewed and stable  Last Vitals:  Vitals Value Taken Time  BP    Temp    Pulse    Resp    SpO2      Last Pain:  Vitals:   10/27/19 0901  PainSc: 0-No pain         Complications: No apparent anesthesia complications

## 2019-10-27 NOTE — Op Note (Signed)
[facility]   MULTIDISCIPLINARY HEART VALVE TEAM   TAVR OPERATIVE NOTE   Date of Procedure:  10/27/2019  Preoperative Diagnosis: Severe Aortic Stenosis   Postoperative Diagnosis: Same   Procedure:    Transcatheter Aortic Valve Replacement - Left subclavian artery approach  Edwards Sapien 3 THV (size 29 mm, model # 9600TFX, serial # [id number])   Co-Surgeons:  Gaye Pollack, MD and Lauree Chandler, MD   Anesthesiologist:  S. Gifford Shave, MD  Echocardiographer:  Liane Comber, MD  Pre-operative Echo Findings:  Severe aortic stenosis  Normal left ventricular systolic function  Post-operative Echo Findings:  Mild paravalvular leak  Normal left ventricular systolic function   BRIEF CLINICAL NOTE AND INDICATIONS FOR SURGERY  This 86 year old gentleman has stage D2, severe, symptomatic, low flow, low gradient, reduced ejection fraction aortic valve stenosis currently with New York Heart Association class II symptoms of exertional fatigue and a BNP of 2308 in January 2021 when he was admitted with atrial fibrillation and shortness of breath consistent with acute on chronic combined systolic and diastolic congestive heart failure. I have personally reviewed his 2D echocardiogram, cardiac catheterization, and CTA studies. His echocardiogram shows a severely calcified aortic valve with restricted mobility. The mean gradient is 22 mmHg with a peak gradient of 41.5 mmHg. Dimensionless index is 0.14 with a calculated aortic valve area of 0.65 cm by VTI. Left ventricular ejection fraction was 25 to 30% with global hypokinesis and a left ventricular internal diastolic dimension of [id number] cm. Cardiac catheterization showed mild to moderate nonobstructive coronary disease. I agree that aortic valve replacement is indicated in this gentleman who is still independent and has a reasonable 5-year survival. He is not very active due to his balance issues with vertigo and I suspect that he  would be more symptomatic if he could be more active. He was admitted with acute on chronic congestive heart failure in January 2021 when he went into rapid atrial fibrillation and I think it is severe aortic stenosis is not repaired he would likely have a progressive downhill course over the next 1 to 2 years. I do not think he would be a good candidate for open surgical AVR due to his age, reduced ejection fraction, stage III chronic kidney disease, and reduced mobility. I think he would be a reasonable candidate for transcatheter aortic valve replacement. His gated cardiac CTA shows anatomy suitable for transcatheter aortic valve replacement. He does have some calcification extending into the LVOT under the noncoronary sinus which could increase his risk of perivalvular leak in this area. His abdominal and pelvic CTA shows very tortuous iliac arteries with moderate calcific atherosclerosis. The infrarenal abdominal aorta is mildly aneurysmal at 3.4 cm but also has multiple intraluminal webs suggestive of a chronic dissection.  The left subclavian artery does appear suitable for access.  The patient and his daughter were counseled at length regarding treatment alternatives for management of severe symptomatic aortic stenosis. The risks and benefits of surgical intervention has been discussed in detail. Long-term prognosis with medical therapy was discussed. Alternative approaches such as conventional surgical aortic valve replacement, transcatheter aortic valve replacement, and palliative medical therapy were compared and contrasted at length. This discussion was placed in the context of the patient's own specific clinical presentation and past medical history. All of their questions have been addressed.   Following the decision to proceed with transcatheter aortic valve replacement, a discussion was held regarding what types of management strategies would be attempted intraoperatively in the  event of  life-threatening complications, including whether or not the patient would be considered a candidate for the use of cardiopulmonary bypass and/or conversion to open sternotomy for attempted surgical intervention. He is 86 years old with stage III chronic kidney disease and I do not think he would be a candidate for emergent sternotomy to manage any intraoperative complications.   The patient has been advised of a variety of complications that might develop including but not limited to risks of death, stroke, paravalvular leak, aortic dissection or other major vascular complications, aortic annulus rupture, device embolization, cardiac rupture or perforation, mitral regurgitation, acute myocardial infarction, arrhythmia, heart block or bradycardia requiring permanent pacemaker placement, congestive heart failure, respiratory failure, renal failure, pneumonia, infection, other late complications related to structural valve deterioration or migration, or other complications that might ultimately cause a temporary or permanent loss of functional independence or other long term morbidity. The patient provides full informed consent for the procedure as described and all questions were answered.     DETAILS OF THE OPERATIVE PROCEDURE  PREPARATION:    The patient is brought to the operating room on the above mentioned date and appropriate monitoring was established by the anesthesia team. The patient is placed in the supine position on the operating table.  Intravenous antibiotics are administered. General endotracheal anesthesia is induced uneventfully.  Baseline transesophageal echocardiogram was performed. The patient's chest, abdomen, and both groins are prepared and draped in a sterile manner. A time out procedure is performed.   PERIPHERAL ACCESS:    Using the modified Seldinger technique, femoral arterial and venous access was obtained with placement of 6 Fr sheaths on the right side.  A pigtail  diagnostic catheter was passed through the right arterial sheath under fluoroscopic guidance into the aortic root.  A temporary transvenous pacemaker catheter was passed through the right femoral venous sheath under fluoroscopic guidance into the right ventricle.  The pacemaker was tested to ensure stable lead placement and pacemaker capture. Aortic root angiography was performed in order to determine the optimal angiographic angle for valve deployment.   LEFT SUBCLAVIAN ACCESS:   A transverse incision was made below the left clavicle and carried down through the subcutaneous tissue using electrocautery. The pectoralis major muscle was split along its fibers and the pectoralis minor muscle retracted laterally. The left axillary artery was identified and encircled with a vessel loop. The patient was heparinized systemically and ACT verified > 250 seconds.  A double concentric purse string suture of CV-4 gortex was placed in the anterior wall of the artery. The artery was cannulated with a needle and a J- wire advanced into the ascending aorta. An 8 F sheath was inserted over the wire. The aortic valve was crossed with a AL-2 catheter and a straight wire. This was exchanged for a pigtail catheter and position was confirmed in the LV apex.  The pigtail catheter was exchanged for an Amplatz Extra-stiff wire in the LV apex.  Then a 19 F E-sheath was inserted into the subclavian artery and the tip advanced into the aortic arch.  BALLOON AORTIC VALVULOPLASTY:   Not performed.  TRANSCATHETER HEART VALVE DEPLOYMENT:   An Edwards Sapien 3 transcatheter heart valve (size 29 mm, model #9600TFX, serial [id number]) was prepared and crimped per manufacturer's guidelines, and the proper orientation of the valve is confirmed on the [employer] delivery system. The valve was advanced through the introducer sheath using normal technique until in an appropriate position in the ascending aorta beyond the  sheath tip. The  balloon was then retracted and using the fine-tuning wheel was centered on the valve. The valve was then advanced across the aortic arch using appropriate flexion of the catheter. The valve was carefully positioned across the aortic valve annulus. The Commander catheter was retracted using normal technique. Once final position of the valve has been confirmed by angiographic assessment, the valve is deployed while temporarily holding ventilation and during rapid ventricular pacing to maintain systolic blood pressure < 50 mmHg and pulse pressure < 10 mmHg. The balloon inflation is held for >3 seconds after reaching full deployment volume. Once the balloon has fully deflated the balloon is retracted into the ascending aorta and valve function is assessed using echocardiography. There is felt to be mild paravalvular leak and no central aortic insufficiency. Post-procedural gradients were acceptable. The patient's hemodynamic recovery following valve deployment is good.  The deployment balloon and guidewire are both removed.    PROCEDURE COMPLETION:   The sheath was removed and subclavian artery closure performed using the pursestring sutures.  Protamine was administered once subclavian arterial repair was complete. The temporary pacemaker, pigtail catheters and femoral sheaths were removed with manual pressure used for hemostasis.  A Mynx femoral closure device was utilized following removal of the diagnostic sheath in the right femoral artery.  The patient tolerated the procedure well and is transported to the cath lab recovery area in stable condition. There were no immediate intraoperative complications. All sponge instrument and needle counts are verified correct at completion of the operation.   No blood products were administered during the operation.  The patient received a total of 40 mL of intravenous contrast during the procedure.   Gaye Pollack, MD 10/27/2019

## 2019-10-27 NOTE — Discharge Instructions (Signed)
ACTIVITY AND EXERCISE °• Daily activity and exercise are an important part of your recovery. People recover at different rates depending on their general health and type of valve procedure. °• Most people recovering from TAVR feel better relatively quickly  °• No lifting, pushing, pulling more than 10 pounds (examples to avoid: groceries, vacuuming, gardening, golfing): °            - For one week with a procedure through the groin. °            - For six weeks for procedures through the chest wall or neck. °NOTE: You will typically see one of our providers 7-14 days after your procedure to discuss WHEN TO RESUME the above activities.  °  °  °DRIVING °• Do not drive until you are seen for follow up and cleared by a provider. Generally, we ask patient to not drive for 1 week after their procedure. °• If you have been told by your doctor in the past that you may not drive, you must talk with him/her before you begin driving again. °  °DRESSING °• Groin site: you may leave the clear dressing over the site for up to one week or until it falls off. °  °HYGIENE °• If you had a femoral (leg) procedure, you may take a shower when you return home. After the shower, pat the site dry. Do NOT use powder, oils or lotions in your groin area until the site has completely healed. °• If you had a chest procedure, you may shower when you return home unless specifically instructed not to by your discharging practitioner. °            - DO NOT scrub incision; pat dry with a towel. °            - DO NOT apply any lotions, oils, powders to the incision. °            - No tub baths / swimming for at least 2 weeks. °• If you notice any fevers, chills, increased pain, swelling, bleeding or pus, please contact your doctor. °  °ADDITIONAL INFORMATION °• If you are going to have an upcoming dental procedure, please contact our office as you will require antibiotics ahead of time to prevent infection on your heart valve.  ° ° °If you have any  questions or concerns you can call the structural heart phone during normal business hours 8am-4pm. If you have an urgent need after hours or weekends please call [phone] to talk to the on call provider for general cardiology. If you have an emergency that requires immediate attention, please call 911.  ° ° °After TAVR Checklist ° °Check  Test Description  ° Follow up appointment in 1-2 weeks  You will see our structural heart physician assistant, Katie Ryun Velez. Your incision sites will be checked and you will be cleared to drive and resume all normal activities if you are doing well.    ° 1 month echo and follow up  You will have an echo to check on your new heart valve and be seen back in the office by Katie Milaina Sher. Many times the echo is not read by your appointment time, but Katie will call you later that day or the following day to report your results.  ° Follow up with your primary cardiologist You will need to be seen by your primary cardiologist in the following 3-6 months after your 1 month appointment in the valve   clinic. Often times your Plavix or Aspirin will be discontinued during this time, but this is decided on a case by case basis.   ° 1 year echo and follow up You will have another echo to check on your heart valve after 1 year and be seen back in the office by Katie Shanel Prazak. This your last structural heart visit.  ° Bacterial endocarditis prophylaxis  You will have to take antibiotics for the rest of your life before all dental procedures (even teeth cleanings) to protect your heart valve. Antibiotics are also required before some surgeries. Please check with your cardiologist before scheduling any surgeries. Also, please make sure to tell us if you have a penicillin allergy as you will require an alternative antibiotic.   ° ° °

## 2019-10-27 NOTE — Progress Notes (Signed)
Patient presented to short stay with elevated BP 220/83.  Recheck was 234/54 upon completing pre-procedure.  Dr. Gifford Shave made aware.  Patient's arterial line connected and associated.  Will continue to monitor patient.

## 2019-10-27 NOTE — Progress Notes (Signed)
  Echocardiogram Echocardiogram Transesophageal has been performed.  Clinton Holmes 10/27/2019, 1:35 PM

## 2019-10-28 ENCOUNTER — Other Ambulatory Visit: Payer: Self-pay | Admitting: *Deleted

## 2019-10-28 ENCOUNTER — Inpatient Hospital Stay (INDEPENDENT_AMBULATORY_CARE_PROVIDER_SITE_OTHER): Payer: Medicare Other

## 2019-10-28 ENCOUNTER — Encounter (HOSPITAL_COMMUNITY): Payer: Self-pay | Admitting: Surgery

## 2019-10-28 ENCOUNTER — Encounter: Payer: Self-pay | Admitting: Surgery

## 2019-10-28 ENCOUNTER — Inpatient Hospital Stay (HOSPITAL_COMMUNITY): Payer: Medicare Other

## 2019-10-28 DIAGNOSIS — I350 Nonrheumatic aortic (valve) stenosis: Secondary | ICD-10-CM

## 2019-10-28 DIAGNOSIS — Z954 Presence of other heart-valve replacement: Secondary | ICD-10-CM

## 2019-10-28 DIAGNOSIS — I447 Left bundle-branch block, unspecified: Secondary | ICD-10-CM | POA: Diagnosis not present

## 2019-10-28 DIAGNOSIS — Z952 Presence of prosthetic heart valve: Secondary | ICD-10-CM

## 2019-10-28 DIAGNOSIS — I714 Abdominal aortic aneurysm, without rupture, unspecified: Secondary | ICD-10-CM | POA: Diagnosis present

## 2019-10-28 LAB — BASIC METABOLIC PANEL
Anion gap: 12 (ref 5–15)
BUN: 21 mg/dL (ref 8–23)
CO2: 23 mmol/L (ref 22–32)
Calcium: 9.3 mg/dL (ref 8.9–10.3)
Chloride: 106 mmol/L (ref 98–111)
Creatinine, Ser: 1.42 mg/dL — ABNORMAL HIGH (ref 0.61–1.24)
GFR calc Af Amer: 50 mL/min — ABNORMAL LOW (ref >60)
GFR calc non Af Amer: 43 mL/min — ABNORMAL LOW (ref >60)
Glucose, Bld: 121 mg/dL — ABNORMAL HIGH (ref 70–99)
Potassium: 4 mmol/L (ref 3.5–5.1)
Sodium: 141 mmol/L (ref 135–145)

## 2019-10-28 LAB — CBC
HCT: 38.4 % — ABNORMAL LOW (ref 39.0–52.0)
Hemoglobin: 12.6 g/dL — ABNORMAL LOW (ref 13.0–17.0)
MCH: 30 pg (ref 26.0–34.0)
MCHC: 32.8 g/dL (ref 30.0–36.0)
MCV: 91.4 fL (ref 80.0–100.0)
Platelets: 138 10*3/uL — ABNORMAL LOW (ref 150–400)
RBC: 4.2 MIL/uL — ABNORMAL LOW (ref 4.22–5.81)
RDW: 14.9 % (ref 11.5–15.5)
WBC: 9.6 10*3/uL (ref 4.0–10.5)
nRBC: 0 % (ref 0.0–0.2)

## 2019-10-28 LAB — ECHOCARDIOGRAM COMPLETE
Height: 72 in
Weight: 3830.71 oz

## 2019-10-28 LAB — MAGNESIUM: Magnesium: 2 mg/dL (ref 1.7–2.4)

## 2019-10-28 MED ORDER — FUROSEMIDE 40 MG PO TABS
40.0000 mg | ORAL_TABLET | Freq: Every day | ORAL | Status: DC
  Administered 2019-10-28: 40 mg via ORAL
  Filled 2019-10-28: qty 1

## 2019-10-28 MED ORDER — TAMSULOSIN HCL 0.4 MG PO CAPS: 0.4000 mg | ORAL_CAPSULE | Freq: Every day | ORAL | 1 refills | Status: DC

## 2019-10-28 MED ORDER — TAMSULOSIN HCL 0.4 MG PO CAPS
0.4000 mg | ORAL_CAPSULE | Freq: Every day | ORAL | Status: DC
  Administered 2019-10-28: 0.4 mg via ORAL
  Filled 2019-10-28: qty 1

## 2019-10-28 NOTE — Anesthesia Postprocedure Evaluation (Signed)
Anesthesia Post Note  Patient: Clinton Holmes  Procedure(s) Performed: TRANSCATHETER AORTIC VALVE REPLACEMENT, LEFT SUBCLAVIAN (Left Chest) TRANSESOPHAGEAL ECHOCARDIOGRAM (TEE) (N/A )     Patient location during evaluation: Cath Lab Anesthesia Type: General Level of consciousness: awake and alert Pain management: pain level controlled Vital Signs Assessment: post-procedure vital signs reviewed and stable Respiratory status: spontaneous breathing, nonlabored ventilation, respiratory function stable and patient connected to nasal cannula oxygen Cardiovascular status: blood pressure returned to baseline and stable Postop Assessment: no apparent nausea or vomiting Anesthetic complications: no    Last Vitals:  Vitals:   10/28/19 0958 10/28/19 0959  BP: (!) 159/69   Pulse:  63  Resp: 15   Temp:    SpO2:      Last Pain:  Vitals:   10/28/19 0551  TempSrc:   PainSc: 0-No pain                 Catalina Gravel

## 2019-10-28 NOTE — Plan of Care (Signed)
Continue to monitor

## 2019-10-28 NOTE — Progress Notes (Addendum)
[place] VALVE TEAM  Patient Name: Clinton Holmes Date of Encounter: 10/28/2019  Primary Cardiologist: Candee Furbish, MD / Dr. Angelena Form & Dr. Cyndia Bent (TAVR)  Hospital Problem List     Principal Problem:   S/P TAVR (transcatheter aortic valve replacement) Active Problems:   Essential hypertension   CKD (chronic kidney disease), stage III   Severe aortic stenosis   Gait instability   Acute on chronic systolic heart failure (HCC)   PVC's (premature ventricular contractions)   Persistent atrial fibrillation (HCC)   Ischemic cardiomyopathy   AAA (abdominal aortic aneurysm) (HCC)     Subjective   No complaints. Still has not urinated.   Inpatient Medications    Scheduled Meds: . aspirin EC  81 mg Oral Daily  . Chlorhexidine Gluconate Cloth  6 each Topical Daily  . furosemide  40 mg Oral Daily  . metoprolol succinate  50 mg Oral BID  . sodium chloride flush  3 mL Intravenous Q12H  . tamsulosin  0.4 mg Oral QPC breakfast   Continuous Infusions: . sodium chloride    . cefUROXime (ZINACEF)  IV 1.5 g (10/28/19 0543)  . nitroGLYCERIN    . phenylephrine (NEO-SYNEPHRINE) Adult infusion     PRN Meds: sodium chloride, acetaminophen **OR** acetaminophen, morphine injection, ondansetron (ZOFRAN) IV, oxyCODONE, sodium chloride flush, traMADol   Vital Signs    Vitals:   10/28/19 0000 10/28/19 0400 10/28/19 0431 10/28/19 0800  BP: (!) 115/53 (!) 141/56  (!) 149/54  Pulse: (!) 54 (!) 55 61 (!) 58  Resp: 14 14 14 17   Temp:  98.1 F (36.7 C)    TempSrc:  Oral    SpO2: 94% 94% 95% 92%  Weight:  108.6 kg    Height:        Intake/Output Summary (Last 24 hours) at 10/28/2019 0856 Last data filed at 10/28/2019 0543 Gross per 24 hour  Intake 2671.67 ml  Output 1925 ml  Net 746.67 ml   Filed Weights   10/27/19 0855 10/28/19 0400  Weight: 108.9 kg 108.6 kg    Physical Exam   GEN: Well nourished, well developed, in no acute distress.  Elderly man sitting up in bed HEENT: Grossly normal.  Neck: Supple, no JVD, carotid bruits, or masses. Cardiac: RRR, no murmurs, rubs, or gallops. No clubbing, cyanosis, edema.   Respiratory:  Respirations regular and unlabored, clear to auscultation bilaterally. GI: Soft, nontender, nondistended, BS + x 4. MS: no deformity or atrophy. Skin: warm and dry, no rash. Subclavian site and groin site clear without hematoma or ecchymosis  Neuro:  Strength and sensation are intact. Psych: AAOx3.  Normal affect.  Labs    CBC Recent Labs    10/27/19 1442 10/28/19 0340  WBC  --  9.6  HGB 13.3 12.6*  HCT 39.0 38.4*  MCV  --  91.4  PLT  --  0000000*   Basic Metabolic Panel Recent Labs    10/27/19 1442 10/28/19 0340  NA 141 141  K 3.6 4.0  CL 105 106  CO2  --  23  GLUCOSE 133* 121*  BUN 22 21  CREATININE 1.20 1.42*  CALCIUM  --  9.3  MG  --  2.0   Liver Function Tests No results for input(s): AST, ALT, ALKPHOS, BILITOT, PROT, ALBUMIN in the last 72 hours. No results for input(s): LIPASE, AMYLASE in the last 72 hours. Cardiac Enzymes No results for input(s): CKTOTAL, CKMB, CKMBINDEX, TROPONINI in the last 72  hours. BNP Invalid input(s): POCBNP D-Dimer No results for input(s): DDIMER in the last 72 hours. Hemoglobin A1C No results for input(s): HGBA1C in the last 72 hours. Fasting Lipid Panel No results for input(s): CHOL, HDL, LDLCALC, TRIG, CHOLHDL, LDLDIRECT in the last 72 hours. Thyroid Function Tests No results for input(s): TSH, T4TOTAL, T3FREE, THYROIDAB in the last 72 hours.  Invalid input(s): FREET3  Telemetry    Sinus with PVCs and wide QRS- Personally Reviewed  ECG    Sinus with IVCD and PVC - Personally Reviewed  Radiology    DG Chest Port 1 View  Result Date: 10/27/2019 CLINICAL DATA:  Status post aortic valve replacement EXAM: PORTABLE CHEST 1 VIEW COMPARISON:  None. FINDINGS: The heart size and mediastinal contours are within normal limits. Both lungs  are clear. The visualized skeletal structures are unremarkable. IMPRESSION: No active disease. Electronically Signed   By: Ulyses Jarred M.D.   On: 10/27/2019 18:59   ECHO TEE  Result Date: 10/27/2019    TRANSESOPHOGEAL ECHO REPORT   Patient Name:   Clinton Holmes Date of Exam: 10/27/2019 Medical Rec #:  [id number]       Height:       72.0 in Accession #:    [id number]      Weight:       240.1 lb Date of Birth:  08-06-1928        BSA:          2.303 m Patient Age:    43 years        BP:           200/86 mmHg Patient Gender: M               HR:           49 bpm. Exam Location:  Inpatient Procedure: TEE-Intraopertive, Cardiac Doppler and Color Doppler Indications:     I35.0 Nonrheumatic aortic (valve) stenosis  History:         Patient has prior history of Echocardiogram examinations, most                  recent 08/19/2019. CHF, Abnormal ECG, Aortic Valve Disease;                  Arrythmias:Atrial Fibrillation. TAVR procedure.  Sonographer:     Roseanna Rainbow RDCS Referring Phys:  [place] Diagnosing Phys: Ena Dawley MD PROCEDURE: The transesophogeal probe was passed without difficulty through the esophogus of the patient. Sedation performed by different physician. The patient's vital signs; including heart rate, blood pressure, and oxygen saturation; remained stable throughout the procedure. The patient developed no complications during the procedure. IMPRESSIONS  1. TAVR procedure, subclavian approach, a 29 mm Edwards-SAPIEN 3 valve was successfully deployed in the aortic position. Peak/mean transaortic gradients 73/46 mmHg decreased to 16/7 mmHg. There were two small jets of paravalvular leak, combined paravalvular leak is mild.  2. Left ventricular ejection fraction, by estimation, is 60 to 65%. The left ventricle has normal function. The left ventricle has no regional wall motion abnormalities. There is moderate concentric left ventricular hypertrophy. Left ventricular diastolic function  could not be evaluated.  3. Right ventricular systolic function is normal. The right ventricular size is normal.  4. Left atrial size was mildly dilated. No left atrial/left atrial appendage thrombus was detected.  5. The mitral valve is normal in structure. Mild mitral valve regurgitation. No evidence of mitral stenosis.  6. The aortic valve is normal  in structure. Aortic valve regurgitation is not visualized. Severe aortic valve stenosis. Aortic valve mean gradient measures 46.0 mmHg.  7. The inferior vena cava is normal in size with greater than 50% respiratory variability, suggesting right atrial pressure of 3 mmHg.  8. Evidence of atrial level shunting detected by color flow Doppler. There is a small patent foramen ovale. Conclusion(s)/Recommendation(s): Normal biventricular function without evidence of hemodynamically significant valvular heart disease. FINDINGS  Left Ventricle: Left ventricular ejection fraction, by estimation, is 60 to 65%. The left ventricle has normal function. The left ventricle has no regional wall motion abnormalities. The left ventricular internal cavity size was normal in size. There is  moderate concentric left ventricular hypertrophy. Left ventricular diastolic function could not be evaluated. Right Ventricle: The right ventricular size is normal. No increase in right ventricular wall thickness. Right ventricular systolic function is normal. Left Atrium: Left atrial size was mildly dilated. No left atrial/left atrial appendage thrombus was detected. Right Atrium: Right atrial size was normal in size. Pericardium: There is no evidence of pericardial effusion. Mitral Valve: The mitral valve is normal in structure. Normal mobility of the mitral valve leaflets. Mild mitral valve regurgitation. No evidence of mitral valve stenosis. Tricuspid Valve: The tricuspid valve is normal in structure. Tricuspid valve regurgitation is trivial. No evidence of tricuspid stenosis. Aortic Valve: The  aortic valve is normal in structure.. There is severe thickening and severe calcifcation of the aortic valve. Aortic valve regurgitation is not visualized. Severe aortic stenosis is present. There is severe thickening of the aortic valve. There is severe calcifcation of the aortic valve. Aortic valve mean gradient measures 46.0 mmHg. Aortic valve peak gradient measures 45.6 mmHg. Aortic valve area, by VTI measures 0.92 cm. Pulmonic Valve: The pulmonic valve was normal in structure. Pulmonic valve regurgitation is not visualized. No evidence of pulmonic stenosis. Aorta: The aortic root is normal in size and structure. Venous: The inferior vena cava is normal in size with greater than 50% respiratory variability, suggesting right atrial pressure of 3 mmHg. IAS/Shunts: Evidence of atrial level shunting detected by color flow Doppler. A small patent foramen ovale is detected.  LEFT VENTRICLE PLAX 2D LVOT diam:     2.50 cm LV SV:         75 LV SV Index:   33 LVOT Area:     4.91 cm  AORTIC VALVE AV Area (Vmax):    1.42 cm AV Area (Vmean):   1.32 cm AV Area (VTI):     0.92 cm AV Vmax:           337.67 cm/s AV Vmean:          235.667 cm/s AV VTI:            0.817 m AV Peak Grad:      45.6 mmHg AV Mean Grad:      46.0 mmHg LVOT Vmax:         97.40 cm/s LVOT Vmean:        63.400 cm/s LVOT VTI:          0.154 m LVOT/AV VTI ratio: 0.19  SHUNTS Systemic VTI:  0.15 m Systemic Diam: 2.50 cm Ena Dawley MD Electronically signed by Ena Dawley MD Signature Date/Time: 10/27/2019/2:09:30 PM    Final    Structural Heart Procedure  Result Date: 10/27/2019 See surgical note for result.   Cardiac Studies  TAVR OPERATIVE NOTE   Date of Procedure:10/27/2019  Preoperative Diagnosis:Severe Aortic Stenosis   Postoperative Diagnosis:Same  Procedure:   Transcatheter Aortic Valve Replacement -Left subclavian artery approach Edwards Sapien 3 THV (size 34mm, model #  [id number], serial [id number])  Co-Surgeons:Hildy Nicholl Alveria Apley, MD and Lauree Chandler, MD   Anesthesiologist:S. Gifford Shave, MD  Anne Hahn, MD  Pre-operative Echo Findings: ? Severe aortic stenosis ? Normalleft ventricular systolic function  Post-operative Echo Findings: ? Mildparavalvular leak ? Normalleft ventricular systolic function  _____________   Echo 10/28/19: complete but pending formal read at the time of discharge   Patient Profile     Clinton Holmes is a 87 y.o. male with a history of acoustic neuroma with chronic balance issues, non obst CAD, PAF on amio & Eliquis (s/p DCCV in 08/2019), CKD stage III, HTN, PVCs, ischemic cardiomyopathyand severe LFLG aortic stenosis who presented to [facility] on 10/27/19 for planned TAVR.  Assessment & Plan    LFLG severe AS:s/p successful TAVR with a 29 mm Edwards Sapien 3 THV via the left subclavian approach (due to aorta-iliac disease) on 10/27/19. Post operative echo pending. Groin and subclavian sites are stable. ECG with sinus brady with non specific IVCD but no high grade heart block. He has been continued on home aspirin and home Eliquis 2.5mg  BID will be resumed tonight. Possible DC home later today with [facility] Patch, if he is able to urinate.    HTN: BP has been labile. Resume on home Torpol XL 50mg  daily and lasix 40mg  daily.   Afib with RVR: after valve deployment, he converted into afib with RVR with new abberency. He was treated with a bolus of IV amio and restarted on home Toprol XL. ECG from this AM shows he spontaneously converted to NSR with non specific IVCD. Will resume home Eliquis tonight.   Acute on chronic combined S/D CHF: as evidenced by an elevated BNP on pre admission lab work and cardiomegaly CXR. This has been treated with TAVR. Resumed on home lasix today. EF on intraoperative TEE showed normal EF at 60-65%.  CKD stage III:  creat a little more elevated than baseline; creat 1.2--> 1.42. Continue to monitor  Traumatic foley placement with urinary retention: no pain. Still not able to urinate. Will start Flomax and check a bladder scan. We can replace foley overnight again if she still cannot urinate. If he is able to urinate on his own, possible DC later today.  SignedAngelena Form, PA-C  10/28/2019, 8:56 AM  Pager [phone]   Chart reviewed, patient examined, agree with above. 2D echo pending. ECG this am shows sinus with non-specific IVCD. QRS is 124 ms this am compared to 180 ms postop. HR 60's. I think it would be best to send home with [facility] Patch. He still has not urinated since foley removed last night and had gross hematuria before it was removed. Will bladder scan this am. He can't go home until voiding on his own. Start Flomax.

## 2019-10-28 NOTE — Progress Notes (Signed)
Telemetry and IV's discontinued at this time. CCMD notified. Discharge instructions reviewed with patient and patient's daughter, [name]. All questions answered.

## 2019-10-28 NOTE — Progress Notes (Signed)
Patient voided in the urinal 75 ml urine. Bladder scan done. 0 ml residual urine. Will continue to monitor.

## 2019-10-28 NOTE — Social Work (Signed)
[facility] RN reached out to CSW to express concerns regarding patient possibly need SNF. CSW contacted patent's daughter,Debbie to inquire about patient's support at home.. CSW advised per Cardiac Rehab note patient is weak and was incontinent(urine). Patient's daughter explained she and her siblings take turns spending the night, making his meals and assisting with his care. Including he receives outside assistance 2x per week. She states she is aware if his incontinence and he uses depends at home. She expressed she really would hate to place him in a SNF. She hopes the patient will get better and be able to live home alone again. CSW encourage patient's daughter  to begin to explore and discuss with family long term care goals especially if the patient is unable to live home alone.    Thurmond Butts, MSW, [place] Clinical Social Worker

## 2019-10-28 NOTE — Consult Note (Addendum)
   [facility] Inpatient Consult   10/28/2019  Clinton Holmes [birth year] [id number]   Patient is currently active with [facility] for chronic disease management services with [place].  RN Health Coach alerted of patient's admission. Following for post hospital needs. Patient in the [place] [employer] for services and benefits.  Patient is a S/P TAVR.   Patient will receive a post hospital call and will be evaluated for assessments and disease process education.   Medical progress notes and team notes reviewed for needs for post hospital follow up.  Exercise Physiologist notes reviewed for potential needs.  Plan: Notified Inpatient Transition Of Care [TOC] team member to make aware that [place] [facility] following.  Will follow up for community needs as well and assign to RN Care Management Coordinator.  Inpatient TOC team responded and states family aware of need for assistance with ambulation and care needs.   Of note, [facility] Care Management services does not replace or interfere with any services that are needed or arranged by inpatient [facility] care management team.  For additional questions or referrals please contact:   Natividad Brood, RN BSN [place] Liaison  [phone] business mobile phone Toll free office [phone]  Fax number: [phone] [email or web address]

## 2019-10-28 NOTE — Progress Notes (Signed)
  Echocardiogram 2D Echocardiogram has been performed.  Clinton Holmes [name] 10/28/2019, 10:03 AM

## 2019-10-28 NOTE — Discharge Summary (Addendum)
Clinton Holmes VALVE TEAM  Discharge Summary    Patient ID: Clinton Holmes MRN: [id number]; DOB: 26-Oct-1927  Admit date: 10/27/2019 Discharge date: 10/28/2019  Primary Care Provider: Leonides Sake, MD  Primary Cardiologist: Candee Furbish, MD / Dr. Angelena Form & Dr. Cyndia Bent (TAVR)  Discharge Diagnoses    Principal Problem:   S/P TAVR (transcatheter aortic valve replacement) Active Problems:   Essential hypertension   CKD (chronic kidney disease), stage III   Severe aortic stenosis   Gait instability   Acute on chronic systolic heart failure (HCC)   PVC's (premature ventricular contractions)   Persistent atrial fibrillation (HCC)   Ischemic cardiomyopathy   AAA (abdominal aortic aneurysm) ([place])   Allergies No Known Allergies  Diagnostic Studies/Procedures    TAVR OPERATIVE NOTE   Date of Procedure:                10/27/2019  Preoperative Diagnosis:      Severe Aortic Stenosis   Postoperative Diagnosis:    Same   Procedure:        Transcatheter Aortic Valve Replacement - Left subclavian artery approach             Edwards Sapien 3 THV (size 29 mm, model # 9600TFX, serial # [id number])              Co-Surgeons:                        Gaye Pollack, MD and Lauree Chandler, MD   Anesthesiologist:                  Collins Scotland, MD  Echocardiographer:              Liane Comber, MD  Pre-operative Echo Findings: ? Severe aortic stenosis ? Normal left ventricular systolic function  Post-operative Echo Findings: ? Mild paravalvular leak ? Normal left ventricular systolic function  _____________   Echo 10/28/19: complete but pending formal read at the time of discharge   History of Present Illness     Clinton Holmes is a 86 y.o. male with a history of acoustic neuroma with chronic balance issues, non obst CAD, PAF on amio & Eliquis (s/p DCCV in 08/2019), CKD stage III, HTN, PVCs, ischemic cardiomyopathyand severe LFLG  aortic stenosis who presented to [facility] on 10/27/19 for planned TAVR.  He was admitted to [facility] in January 2021 with atrial fibrillation with rapid ventricular response as well as nausea and vomiting.  An echocardiogram showed an ejection fraction of 25 to 30%. The aortic valve was calcified with restricted mobility. The mean gradient across the valve was 22 mmHg with a peak gradient of 41 mmHg. Aortic valve area was 0.65 cm with a dimensionless index of 0.14. This was felt to represent low flow, low gradient, severe aortic stenosis. He underwent TEE guided cardioversion and was started on Eliquis. He subsequently underwent cardiac catheterization on 09/23/2019 showing mild to moderate nonobstructive coronary disease in the proximal and mid LAD as well as the ramus branch. Right heart cath showed normal pulmonary artery pressures with a mean wedge pressure of 11 mmHg. CVP was normal. Cardiac index was 2.47.  The patient has been evaluated by the multidisciplinary valve team and felt to have severe, symptomatic aortic stenosis and to be a suitable candidate for TAVR, which was set up for 10/27/19.    Hospital Course     Consultants: none  LFLG severe AS: s/p successful TAVR with a 29 mm Edwards Sapien 3 THV via the left subclavian approach (due to aortoiliac disease) on 10/27/19. Post operative echo pending. Groin and subclavian sites are stable. ECG with sinus brady with non specific IVCD but no high grade heart block. He has been continued on home aspirin and home Eliquis 2.5mg  BID will be resumed tonight.   New LBBB: will DC home with a Zio patch to rule out late presenting HAVB.  HTN: BP has been labile. Resume on home Torpol XL 50mg  daily and lasix 40mg  daily. He was also started on an alpha blocker for acute urinary retention which may help with his BP as well.   Afib with RVR: after valve deployment, he converted into afib with RVR with abberency. He was treated with a bolus of IV amio  and restarted on home Toprol XL. ECG from this AM shows he spontaneously converted to NSR with non specific IVCD. Will resume home Eliquis tonight.   Acute on chronic combined S/D CHF: as evidenced by an elevated BNP on pre admission lab work and cardiomegaly CXR. This has been treated with TAVR. Resumed on home lasix today. EF on intraoperative TEE showed normal EF at 60-65%.  CKD stage III: creat a little more elevated than baseline; creat 1.2--> 1.42. Will recheck next week in office.   AAA: pre TAVR CT showed a stable 3.4 cm infrarenal abdominal aortic aneurysm. Recommend follow-up aortic ultrasound in 3 years. Likely will not cause any issues given advanced age. This can be followed over time.  Traumatic foley placement with urinary retention: he was finally able to void. Started on Flomax.   _____________  Discharge Vitals Blood pressure (!) 159/69, pulse 63, temperature 98.1 F (36.7 C), temperature source Oral, resp. rate (!) 21, height 6' (1.829 m), weight 108.6 kg, SpO2 (!) 84 %.  Filed Weights   10/27/19 0855 10/28/19 0400  Weight: 108.9 kg 108.6 kg    Labs & Radiologic Studies    CBC Recent Labs    10/27/19 1442 10/28/19 0340  WBC  --  9.6  HGB 13.3 12.6*  HCT 39.0 38.4*  MCV  --  91.4  PLT  --  0000000*   Basic Metabolic Panel Recent Labs    10/27/19 1442 10/28/19 0340  NA 141 141  K 3.6 4.0  CL 105 106  CO2  --  23  GLUCOSE 133* 121*  BUN 22 21  CREATININE 1.20 1.42*  CALCIUM  --  9.3  MG  --  2.0   Liver Function Tests No results for input(s): AST, ALT, ALKPHOS, BILITOT, PROT, ALBUMIN in the last 72 hours. No results for input(s): LIPASE, AMYLASE in the last 72 hours. Cardiac Enzymes No results for input(s): CKTOTAL, CKMB, CKMBINDEX, TROPONINI in the last 72 hours. BNP Invalid input(s): POCBNP D-Dimer No results for input(s): DDIMER in the last 72 hours. Hemoglobin A1C No results for input(s): HGBA1C in the last 72 hours. Fasting Lipid Panel No  results for input(s): CHOL, HDL, LDLCALC, TRIG, CHOLHDL, LDLDIRECT in the last 72 hours. Thyroid Function Tests No results for input(s): TSH, T4TOTAL, T3FREE, THYROIDAB in the last 72 hours.  Invalid input(s): FREET3 _____________  DG Chest 2 View  Result Date: 10/24/2019 CLINICAL DATA:  86 year old male with aortic stenosis. Preop chest radiograph. EXAM: CHEST - 2 VIEW COMPARISON:  Chest radiograph dated 08/16/2019. FINDINGS: Background of mild emphysema. No focal consolidation, pleural effusion, or pneumothorax. There is mild cardiomegaly. Atherosclerotic calcification of  the aorta. No acute osseous pathology. Old healed right rib fractures. IMPRESSION: 1. No acute cardiopulmonary process. 2. Mild cardiomegaly. Electronically Signed   By: Anner Crete M.D.   On: 10/24/2019 00:38   CT CORONARY MORPH W/CTA COR W/SCORE W/CA W/CM &/OR WO/CM  Addendum Date: 10/02/2019   ADDENDUM REPORT: 10/02/2019 20:02 CLINICAL DATA:  86 year old male with severe aortic stenosis being evaluated for a TAVR procedure. EXAM: Cardiac TAVR CT TECHNIQUE: The patient was scanned on a [employer]. A 120 kV retrospective scan was triggered in the descending thoracic aorta at 111 HU's. Gantry rotation speed was 250 msecs and collimation was .6 mm. No beta blockade or nitro were given. The 3D data set was reconstructed in 5% intervals of the R-R cycle. Systolic and diastolic phases were analyzed on a dedicated work station using MPR, MIP and VRT modes. The patient received 80 cc of contrast. FINDINGS: Aortic Valve: Trileaflet aortic valve with severely calcified leaflets and moderate calcifications extending into the LVOT under the non-coronary sinus. Aorta: Mild ascending aortic aneurysm with maximum diameter 41 mm, mild diffuse atherosclerotic plaque and calcifications. Sinotubular Junction: 33 x 33 mm Ascending Thoracic Aorta: 41 x 40 mm Aortic Arch: 32 x 31 mm Descending Thoracic Aorta: 30 x 28 mm Sinus of Valsalva  Measurements: Non-coronary: 36 mm Right -coronary: 35 mm Left -coronary: 35 mm Coronary Artery Height above Annulus: Left Main: 13 mm Right Coronary: 19 mm Virtual Basal Annulus Measurements: Maximum/Minimum Diameter: 30.4 x 25.3 mm Mean Diameter: 26.4 mm Perimeter: 86.4 mm Area: 549 mm2 Optimum Fluoroscopic Angle for Delivery: LAO 6 CAU 6 IMPRESSION: 1. Trileaflet aortic valve with severely calcified leaflets and moderate calcifications extending into the LVOT under the non-coronary sinus. Aortic valve calcium score is 4445 consistent with severe aortic stenosis. Annular measurements suitable for delivery of a 29 mm Edwards-SAPIEN 3 valve. 2. Sufficient coronary to annulus distance. 3. Optimum Fluoroscopic Angle for Delivery:  LAO 6 CAU 6. 4. No thrombus in the left atrial appendage. Electronically Signed   By: Ena Dawley   On: 10/02/2019 20:02   Result Date: 10/02/2019 EXAM: OVER-READ INTERPRETATION  CT CHEST The following report is an over-read performed by radiologist Dr. Salvatore Marvel of [facility], [place] on 10/02/2019. This over-read does not include interpretation of cardiac or coronary anatomy or pathology. The coronary CTA interpretation by the cardiologist is attached. COMPARISON:  10/30/2018 chest CT angiogram. FINDINGS: Please see the separate concurrent chest CT angiogram report for details. IMPRESSION: Please see the separate concurrent chest CT angiogram report for details. Electronically Signed: By: Ilona Sorrel M.D. On: 10/02/2019 11:39   DG Chest Port 1 View  Result Date: 10/27/2019 CLINICAL DATA:  Status post aortic valve replacement EXAM: PORTABLE CHEST 1 VIEW COMPARISON:  None. FINDINGS: The heart size and mediastinal contours are within normal limits. Both lungs are clear. The visualized skeletal structures are unremarkable. IMPRESSION: No active disease. Electronically Signed   By: Ulyses Jarred M.D.   On: 10/27/2019 18:59   CT ANGIO CHEST AORTA W/CM & OR WO/CM  Result Date:  10/02/2019 CLINICAL DATA:  Severe aortic stenosis. Pre-TAVR planning. EXAM: CT ANGIOGRAPHY CHEST, ABDOMEN AND PELVIS TECHNIQUE: Multidetector CT imaging through the chest, abdomen and pelvis was performed using the standard protocol during bolus administration of intravenous contrast. Multiplanar reconstructed images and MIPs were obtained and reviewed to evaluate the vascular anatomy. CONTRAST:  36mL OMNIPAQUE IOHEXOL 350 MG/ML SOLN COMPARISON:  10/30/2018 chest CT angiogram. FINDINGS: CTA CHEST FINDINGS Cardiovascular: Mild  cardiomegaly. No significant pericardial effusion/thickening. Diffuse thickening and coarse calcification of the aortic valve. Left anterior descending and right coronary atherosclerosis. Atherosclerotic thoracic aorta with ectatic 4.3 cm ascending thoracic aorta. Normal caliber main pulmonary artery. No central pulmonary emboli. Mediastinum/Nodes: Subcentimeter hypodense right thyroid nodules are not appreciably changed and require no follow-up. Unremarkable esophagus. No pathologically enlarged axillary, mediastinal or hilar lymph nodes. Lungs/Pleura: No pneumothorax. No pleural effusion. No acute consolidative airspace disease, lung masses or significant pulmonary nodules. Nonspecific patchy subpleural reticulation and ground-glass opacity at both lung bases, not substantially changed since 10/30/2018 CT. Musculoskeletal:  No aggressive appearing focal osseous lesions. CTA ABDOMEN AND PELVIS FINDINGS Hepatobiliary: Normal liver size. Simple 2.0 cm anterior left liver cyst. Scattered subcentimeter hypodense left liver lesions are too small to characterize and unchanged from 10/30/2018 CT, considered benign. Normal gallbladder with no radiopaque cholelithiasis. No biliary ductal dilatation. Pancreas: Stable splenule at the pancreatic tail measuring 3.0 x 2.2 cm. Otherwise normal pancreas with no additional pancreatic lesions or pancreatic duct dilation. Spleen: Normal size. No mass.  Adrenals/Urinary Tract: Normal adrenals. No hydronephrosis. Soft tissue density exophytic 3.0 cm renal cortical lesion in the interpolar left kidney (series 15/image 145), not substantially changed since 07/04/2013 CT, characterized as benign hemorrhagic/proteinaceous renal cyst on 07/21/2013 MRI. Exophytic simple 1.0 cm interpolar left renal cyst. No additional contour deforming renal lesions. Normal bladder. Stomach/Bowel: Small hiatal hernia. Otherwise normal nondistended stomach. Normal caliber small bowel with no small bowel wall thickening. Candidate normal diminutive appendix. Normal large bowel with no diverticulosis, large bowel wall thickening or pericolonic fat stranding. Vascular/Lymphatic: Atherosclerotic abdominal aorta with 3.4 cm infrarenal abdominal aortic aneurysm, unchanged using similar measurement technique. No pathologically enlarged lymph nodes in the abdomen or pelvis. Reproductive: Mildly enlarged prostate with nonspecific coarse internal prostatic calcifications. Other: No pneumoperitoneum, ascites or focal fluid collection. Musculoskeletal: No aggressive appearing focal osseous lesions. Marked lumbar spondylosis. VASCULAR MEASUREMENTS PERTINENT TO TAVR: AORTA: Minimal Aortic Diameter-18.9 x 15.6 mm Severity of Aortic Calcification-moderate to severe RIGHT PELVIS: Right Common Iliac Artery - Minimal Diameter-9.5 x 8.7 mm Tortuosity-mild-to-moderate Calcification-moderate Right External Iliac Artery - Minimal Diameter-8.8 x 8.7 mm Tortuosity-severe Calcification-none Right Common Femoral Artery - Minimal Diameter-8.5 x 8.4 mm Tortuosity-mild Calcification-none LEFT PELVIS: Left Common Iliac Artery - Minimal Diameter-9.5 x 5.9 mm Tortuosity-mild Calcification-moderate Left External Iliac Artery - Minimal Diameter-8.4 x 8.0 mm Tortuosity-mild Calcification-mild Left Common Femoral Artery - Minimal Diameter-8.1 x 6.5 mm Tortuosity-mild Calcification-moderate Review of the MIP images confirms  the above findings. IMPRESSION: 1. Vascular findings and measurements pertinent to potential TAVR procedure, as detailed. 2. Diffuse thickening and coarse calcification of the aortic valve, compatible with reported history of severe aortic stenosis. 3. Mild cardiomegaly. Two-vessel coronary atherosclerosis. 4. Stable 3.4 cm infrarenal abdominal aortic aneurysm. Abdominal Aortic Aneurysm (ICD10-I71.9). Recommend follow-up aortic ultrasound in 3 years. This recommendation follows ACR consensus guidelines: White Paper of the ACR Incidental Findings Committee II on Vascular Findings. J Am Coll Radiol 2013; 10:789-794. 5. Small hiatal hernia. 6. Mildly enlarged prostate. 7.  Aortic Atherosclerosis (ICD10-I70.0). Electronically Signed   By: Ilona Sorrel M.D.   On: 10/02/2019 13:42   ECHO TEE  Result Date: 10/27/2019    TRANSESOPHOGEAL ECHO REPORT   Patient Name:   Clinton Holmes Date of Exam: 10/27/2019 Medical Rec #:  [id number]       Height:       72.0 in Accession #:    [id number]      Weight:  240.1 lb Date of Birth:  1927/12/16        BSA:          2.303 m Patient Age:    29 years        BP:           200/86 mmHg Patient Gender: M               HR:           49 bpm. Exam Location:  Inpatient Procedure: TEE-Intraopertive, Cardiac Doppler and Color Doppler Indications:     I35.0 Nonrheumatic aortic (valve) stenosis  History:         Patient has prior history of Echocardiogram examinations, most                  recent 08/19/2019. CHF, Abnormal ECG, Aortic Valve Disease;                  Arrythmias:Atrial Fibrillation. TAVR procedure.  Sonographer:     Roseanna Rainbow RDCS Referring Phys:  [place] Diagnosing Phys: Ena Dawley MD PROCEDURE: The transesophogeal probe was passed without difficulty through the esophogus of the patient. Sedation performed by different physician. The patient's vital signs; including heart rate, blood pressure, and oxygen saturation; remained stable throughout the  procedure. The patient developed no complications during the procedure. IMPRESSIONS  1. TAVR procedure, subclavian approach, a 29 mm Edwards-SAPIEN 3 valve was successfully deployed in the aortic position. Peak/mean transaortic gradients 73/46 mmHg decreased to 16/7 mmHg. There were two small jets of paravalvular leak, combined paravalvular leak is mild.  2. Left ventricular ejection fraction, by estimation, is 60 to 65%. The left ventricle has normal function. The left ventricle has no regional wall motion abnormalities. There is moderate concentric left ventricular hypertrophy. Left ventricular diastolic function could not be evaluated.  3. Right ventricular systolic function is normal. The right ventricular size is normal.  4. Left atrial size was mildly dilated. No left atrial/left atrial appendage thrombus was detected.  5. The mitral valve is normal in structure. Mild mitral valve regurgitation. No evidence of mitral stenosis.  6. The aortic valve is normal in structure. Aortic valve regurgitation is not visualized. Severe aortic valve stenosis. Aortic valve mean gradient measures 46.0 mmHg.  7. The inferior vena cava is normal in size with greater than 50% respiratory variability, suggesting right atrial pressure of 3 mmHg.  8. Evidence of atrial level shunting detected by color flow Doppler. There is a small patent foramen ovale. Conclusion(s)/Recommendation(s): Normal biventricular function without evidence of hemodynamically significant valvular heart disease. FINDINGS  Left Ventricle: Left ventricular ejection fraction, by estimation, is 60 to 65%. The left ventricle has normal function. The left ventricle has no regional wall motion abnormalities. The left ventricular internal cavity size was normal in size. There is  moderate concentric left ventricular hypertrophy. Left ventricular diastolic function could not be evaluated. Right Ventricle: The right ventricular size is normal. No increase in right  ventricular wall thickness. Right ventricular systolic function is normal. Left Atrium: Left atrial size was mildly dilated. No left atrial/left atrial appendage thrombus was detected. Right Atrium: Right atrial size was normal in size. Pericardium: There is no evidence of pericardial effusion. Mitral Valve: The mitral valve is normal in structure. Normal mobility of the mitral valve leaflets. Mild mitral valve regurgitation. No evidence of mitral valve stenosis. Tricuspid Valve: The tricuspid valve is normal in structure. Tricuspid valve regurgitation is trivial. No evidence of tricuspid  stenosis. Aortic Valve: The aortic valve is normal in structure.. There is severe thickening and severe calcifcation of the aortic valve. Aortic valve regurgitation is not visualized. Severe aortic stenosis is present. There is severe thickening of the aortic valve. There is severe calcifcation of the aortic valve. Aortic valve mean gradient measures 46.0 mmHg. Aortic valve peak gradient measures 45.6 mmHg. Aortic valve area, by VTI measures 0.92 cm. Pulmonic Valve: The pulmonic valve was normal in structure. Pulmonic valve regurgitation is not visualized. No evidence of pulmonic stenosis. Aorta: The aortic root is normal in size and structure. Venous: The inferior vena cava is normal in size with greater than 50% respiratory variability, suggesting right atrial pressure of 3 mmHg. IAS/Shunts: Evidence of atrial level shunting detected by color flow Doppler. A small patent foramen ovale is detected.  LEFT VENTRICLE PLAX 2D LVOT diam:     2.50 cm LV SV:         75 LV SV Index:   33 LVOT Area:     4.91 cm  AORTIC VALVE AV Area (Vmax):    1.42 cm AV Area (Vmean):   1.32 cm AV Area (VTI):     0.92 cm AV Vmax:           337.67 cm/s AV Vmean:          235.667 cm/s AV VTI:            0.817 m AV Peak Grad:      45.6 mmHg AV Mean Grad:      46.0 mmHg LVOT Vmax:         97.40 cm/s LVOT Vmean:        63.400 cm/s LVOT VTI:          0.154  m LVOT/AV VTI ratio: 0.19  SHUNTS Systemic VTI:  0.15 m Systemic Diam: 2.50 cm Ena Dawley MD Electronically signed by Ena Dawley MD Signature Date/Time: 10/27/2019/2:09:30 PM    Final    VAS US CAROTID  Result Date: 10/02/2019 Carotid Arterial Duplex Study Indications:       Pre-op TAVR. Risk Factors:      Hypertension, coronary artery disease. Other Factors:     Aortic valve disease. Limitations        Today's exam was limited due to Patient sitting in chair. Comparison Study:  No prior study Performing Technologist: Maudry Mayhew MHA, RDMS, RVT, RDCS  Examination Guidelines: A complete evaluation includes B-mode imaging, spectral Doppler, color Doppler, and power Doppler as needed of all accessible portions of each vessel. Bilateral testing is considered an integral part of a complete examination. Limited examinations for reoccurring indications may be performed as noted.  Right Carotid Findings: +----------+-------+-------+--------+---------------------------------+--------+           PSV    EDV    StenosisPlaque Description               Comments           cm/s   cm/s                                                     +----------+-------+-------+--------+---------------------------------+--------+ CCA Prox  85     13                                                       +----------+-------+-------+--------+---------------------------------+--------+  CCA Distal58     9                                                        +----------+-------+-------+--------+---------------------------------+--------+ ICA Prox  50     9              heterogenous and calcific                 +----------+-------+-------+--------+---------------------------------+--------+ ICA Distal48     15                                                       +----------+-------+-------+--------+---------------------------------+--------+ ECA       73                    irregular,  heterogenous and                                               calcific                                  +----------+-------+-------+--------+---------------------------------+--------+ +----------+--------+-------+----------------+-------------------+           PSV cm/sEDV cmsDescribe        Arm Pressure (mmHG) +----------+--------+-------+----------------+-------------------+ [id number]             Multiphasic, WNL                    +----------+--------+-------+----------------+-------------------+ +---------+--------+--------+--------------+ VertebralPSV cm/sEDV cm/sNot identified +---------+--------+--------+--------------+  Left Carotid Findings: +----------+--------+--------+--------+-----------------------+--------+           PSV cm/sEDV cm/sStenosisPlaque Description     Comments +----------+--------+--------+--------+-----------------------+--------+ CCA Prox  61      9               smooth and heterogenous         +----------+--------+--------+--------+-----------------------+--------+ CCA Distal70      9               smooth and heterogenous         +----------+--------+--------+--------+-----------------------+--------+ ICA Prox  60      11              smooth and heterogenous         +----------+--------+--------+--------+-----------------------+--------+ ICA Distal69      15                                              +----------+--------+--------+--------+-----------------------+--------+ ECA       91                      smooth and heterogenous         +----------+--------+--------+--------+-----------------------+--------+ +----------+--------+--------+----------------+-------------------+           PSV cm/sEDV cm/sDescribe        Arm Pressure (mmHG) +----------+--------+--------+----------------+-------------------+ [id number]  Multiphasic, WNL                     +----------+--------+--------+----------------+-------------------+ +---------+--------+--+--------+-+---------+ VertebralPSV cm/s30EDV cm/s8Antegrade +---------+--------+--+--------+-+---------+   Summary: Right Carotid: Velocities in the right ICA are consistent with a 1-39% stenosis. Left Carotid: Velocities in the left ICA are consistent with a 1-39% stenosis. Vertebrals:  Left vertebral artery demonstrates antegrade flow. Right vertebral              artery was not visualized. Subclavians: Normal flow hemodynamics were seen in bilateral subclavian              arteries. *See table(s) above for measurements and observations.  Electronically signed by Deitra Mayo MD on 10/02/2019 at 10:15:37 AM.    Final    Structural Heart Procedure  Result Date: 10/27/2019 See surgical note for result.  CT ANGIO ABDOMEN PELVIS  W &/OR WO CONTRAST  Result Date: 10/02/2019 CLINICAL DATA:  Severe aortic stenosis. Pre-TAVR planning. EXAM: CT ANGIOGRAPHY CHEST, ABDOMEN AND PELVIS TECHNIQUE: Multidetector CT imaging through the chest, abdomen and pelvis was performed using the standard protocol during bolus administration of intravenous contrast. Multiplanar reconstructed images and MIPs were obtained and reviewed to evaluate the vascular anatomy. CONTRAST:  48mL OMNIPAQUE IOHEXOL 350 MG/ML SOLN COMPARISON:  10/30/2018 chest CT angiogram. FINDINGS: CTA CHEST FINDINGS Cardiovascular: Mild cardiomegaly. No significant pericardial effusion/thickening. Diffuse thickening and coarse calcification of the aortic valve. Left anterior descending and right coronary atherosclerosis. Atherosclerotic thoracic aorta with ectatic 4.3 cm ascending thoracic aorta. Normal caliber main pulmonary artery. No central pulmonary emboli. Mediastinum/Nodes: Subcentimeter hypodense right thyroid nodules are not appreciably changed and require no follow-up. Unremarkable esophagus. No pathologically enlarged axillary, mediastinal or hilar  lymph nodes. Lungs/Pleura: No pneumothorax. No pleural effusion. No acute consolidative airspace disease, lung masses or significant pulmonary nodules. Nonspecific patchy subpleural reticulation and ground-glass opacity at both lung bases, not substantially changed since 10/30/2018 CT. Musculoskeletal:  No aggressive appearing focal osseous lesions. CTA ABDOMEN AND PELVIS FINDINGS Hepatobiliary: Normal liver size. Simple 2.0 cm anterior left liver cyst. Scattered subcentimeter hypodense left liver lesions are too small to characterize and unchanged from 10/30/2018 CT, considered benign. Normal gallbladder with no radiopaque cholelithiasis. No biliary ductal dilatation. Pancreas: Stable splenule at the pancreatic tail measuring 3.0 x 2.2 cm. Otherwise normal pancreas with no additional pancreatic lesions or pancreatic duct dilation. Spleen: Normal size. No mass. Adrenals/Urinary Tract: Normal adrenals. No hydronephrosis. Soft tissue density exophytic 3.0 cm renal cortical lesion in the interpolar left kidney (series 15/image 145), not substantially changed since 07/04/2013 CT, characterized as benign hemorrhagic/proteinaceous renal cyst on 07/21/2013 MRI. Exophytic simple 1.0 cm interpolar left renal cyst. No additional contour deforming renal lesions. Normal bladder. Stomach/Bowel: Small hiatal hernia. Otherwise normal nondistended stomach. Normal caliber small bowel with no small bowel wall thickening. Candidate normal diminutive appendix. Normal large bowel with no diverticulosis, large bowel wall thickening or pericolonic fat stranding. Vascular/Lymphatic: Atherosclerotic abdominal aorta with 3.4 cm infrarenal abdominal aortic aneurysm, unchanged using similar measurement technique. No pathologically enlarged lymph nodes in the abdomen or pelvis. Reproductive: Mildly enlarged prostate with nonspecific coarse internal prostatic calcifications. Other: No pneumoperitoneum, ascites or focal fluid collection.  Musculoskeletal: No aggressive appearing focal osseous lesions. Marked lumbar spondylosis. VASCULAR MEASUREMENTS PERTINENT TO TAVR: AORTA: Minimal Aortic Diameter-18.9 x 15.6 mm Severity of Aortic Calcification-moderate to severe RIGHT PELVIS: Right Common Iliac Artery - Minimal Diameter-9.5 x 8.7 mm Tortuosity-mild-to-moderate Calcification-moderate Right External Iliac Artery - Minimal Diameter-8.8 x 8.7 mm Tortuosity-severe Calcification-none  Right Common Femoral Artery - Minimal Diameter-8.5 x 8.4 mm Tortuosity-mild Calcification-none LEFT PELVIS: Left Common Iliac Artery - Minimal Diameter-9.5 x 5.9 mm Tortuosity-mild Calcification-moderate Left External Iliac Artery - Minimal Diameter-8.4 x 8.0 mm Tortuosity-mild Calcification-mild Left Common Femoral Artery - Minimal Diameter-8.1 x 6.5 mm Tortuosity-mild Calcification-moderate Review of the MIP images confirms the above findings. IMPRESSION: 1. Vascular findings and measurements pertinent to potential TAVR procedure, as detailed. 2. Diffuse thickening and coarse calcification of the aortic valve, compatible with reported history of severe aortic stenosis. 3. Mild cardiomegaly. Two-vessel coronary atherosclerosis. 4. Stable 3.4 cm infrarenal abdominal aortic aneurysm. Abdominal Aortic Aneurysm (ICD10-I71.9). Recommend follow-up aortic ultrasound in 3 years. This recommendation follows ACR consensus guidelines: White Paper of the ACR Incidental Findings Committee II on Vascular Findings. J Am Coll Radiol 2013; 10:789-794. 5. Small hiatal hernia. 6. Mildly enlarged prostate. 7.  Aortic Atherosclerosis (ICD10-I70.0). Electronically Signed   By: Ilona Sorrel M.D.   On: 10/02/2019 13:42   Disposition   Pt is being discharged home today in good condition.  Follow-up Plans & Appointments    Follow-up Information    Eileen Stanford, PA-C. Go on 11/04/2019.   Specialties: Cardiology, Radiology Why: @ 2:30pm, please arrive at least 10 minutes  early. Contact information: [address] [place] [address] [phone]            Discharge Medications   Allergies as of 10/28/2019   No Known Allergies     Medication List    TAKE these medications   amoxicillin 500 MG capsule Commonly known as: AMOXIL Take 2,000 mg by mouth See admin instructions. Take 4 capsules (2000 mg) by mouth 1 hour prior to dental appointment   apixaban 2.5 MG Tabs tablet Commonly known as: ELIQUIS Take 1 tablet (2.5 mg total) by mouth 2 (two) times daily.   aspirin 81 MG EC tablet Take 1 tablet (81 mg total) by mouth daily.   furosemide 40 MG tablet Commonly known as: LASIX Take 1 tablet (40 mg total) by mouth daily.   metoprolol succinate 50 MG 24 hr tablet Commonly known as: TOPROL-XL Take 1 tablet (50 mg total) by mouth 2 (two) times daily. Take with or immediately following a meal.   multivitamin with minerals Tabs tablet Take 1 tablet by mouth daily. One-A-Day for Adults 50+   tamsulosin 0.4 MG Caps capsule Commonly known as: FLOMAX Take 1 capsule (0.4 mg total) by mouth daily after breakfast. Start taking on: October 29, 2019           Outstanding Labs/Studies   BMET  Duration of Discharge Encounter   Greater than 30 minutes including physician time.  SignedAngelena Form, PA-C 10/28/2019, 10:48 AM [phone]

## 2019-10-28 NOTE — Progress Notes (Signed)
CARDIAC REHAB PHASE I   PRE:  Rate/Rhythm: 55 SB    BP: sitting 168/83    SaO2: 97 RA  MODE:  Ambulation: 10 ft into hall   POST:  Rate/Rhythm: 77 SR with PVCs    BP: sitting 161/87     SaO2: 97 RA  Pt quite weak but able to slowly move to EOB. Asked for help, sts his children help him at home. He did stand slowly with min assist with gait belt x2. Once standing he was incontinent. Cleaned up, stood again. Used RW, assist x2 max with gait belt. Pt weak, struggled to stand tall and keep feet close to RW. Pushed RW too far and fast, had to slow him with gait belt. He was able to stand up tall when asked. He veered left constantly and upon asking him, he has limited vision on left. Pt to recliner, incontinent of urine again before sitting. VSS in recliner. He struggled to grab his cup with left hand due to skewed perception. He sts his vision is worse today (had previous CVA with impaired left vision). Sts he has RW and recliner and his children will help him. Asked RN to convey his limitations to children. He voiced total 100-150 mL while we were with him (three separate occasions of incontinence). [id number]  [place], ACSM 10/28/2019 11:14 AM

## 2019-10-28 NOTE — Progress Notes (Signed)
Zio patch placed onto patient.  All instructions and information reviewed with patient, they verbalize understanding with no questions. 

## 2019-10-28 NOTE — Plan of Care (Signed)
Discharge to home °

## 2019-10-28 NOTE — Patient Outreach (Signed)
[facility]) Care Management  10/28/2019  NIRAJ ORTLIP [birth year] [id number]   [facility] discipline closure. Patient admitted to hospital. Patient will be followed by Complex Care Coordinator.   Plan: Discipline closure letter sent to PCP  [facility] Management [phone];

## 2019-10-29 ENCOUNTER — Telehealth: Payer: Self-pay | Admitting: Physician Assistant

## 2019-10-29 ENCOUNTER — Telehealth: Payer: Self-pay | Admitting: Cardiovascular Disease

## 2019-10-29 DIAGNOSIS — Z952 Presence of prosthetic heart valve: Secondary | ICD-10-CM

## 2019-10-29 DIAGNOSIS — I447 Left bundle-branch block, unspecified: Secondary | ICD-10-CM | POA: Diagnosis not present

## 2019-10-29 MED FILL — Magnesium Sulfate Inj 50%: INTRAMUSCULAR | Qty: 10 | Status: AC

## 2019-10-29 MED FILL — Heparin Sodium (Porcine) Inj 1000 Unit/ML: INTRAMUSCULAR | Qty: 30 | Status: AC

## 2019-10-29 MED FILL — Potassium Chloride Inj 2 mEq/ML: INTRAVENOUS | Qty: 40 | Status: AC

## 2019-10-29 NOTE — Telephone Encounter (Signed)
Levada Dy from [facility] calling  Patient needs resumptive orders for home health - a new referral For verbal call [phone] option 2 or fax [phone] Please advise

## 2019-10-29 NOTE — Telephone Encounter (Signed)
Dr Rockey Situ, is that ok?

## 2019-10-29 NOTE — Telephone Encounter (Signed)
  [facility] VALVE TEAM   Patient contacted regarding discharge from [facility] on 3/17  Patient understands to follow up with provider Nell Range on 3/24 at [facility].  Patient understands discharge instructions? yes Patient understands medications and regimen? yes Patient understands to bring all medications to this visit? Yes  Angelena Form PA-C  [facility]

## 2019-10-30 NOTE — Telephone Encounter (Signed)
Home health orders are fine by me.

## 2019-10-30 NOTE — Telephone Encounter (Signed)
I have not seen him in clinic He has been seen by Enid Baas in 2020 and Dr. Saunders Revel 08/2019 and in [place] Dr. Saunders Revel referred him back to Dr. Fletcher Anon Would ask Dr. Fletcher Anon? If not following up with Dr. Fletcher Anon and he is following up with me, I can certainly write the orders

## 2019-10-30 NOTE — Telephone Encounter (Signed)
Home health orders and noted faxed to [facility] with number provided.

## 2019-11-03 DIAGNOSIS — N183 Chronic kidney disease, stage 3 unspecified: Secondary | ICD-10-CM | POA: Diagnosis not present

## 2019-11-03 DIAGNOSIS — I493 Ventricular premature depolarization: Secondary | ICD-10-CM | POA: Diagnosis not present

## 2019-11-03 DIAGNOSIS — I714 Abdominal aortic aneurysm, without rupture: Secondary | ICD-10-CM | POA: Diagnosis not present

## 2019-11-03 DIAGNOSIS — I4819 Other persistent atrial fibrillation: Secondary | ICD-10-CM | POA: Diagnosis not present

## 2019-11-03 DIAGNOSIS — Z952 Presence of prosthetic heart valve: Secondary | ICD-10-CM | POA: Diagnosis not present

## 2019-11-03 DIAGNOSIS — I130 Hypertensive heart and chronic kidney disease with heart failure and stage 1 through stage 4 chronic kidney disease, or unspecified chronic kidney disease: Secondary | ICD-10-CM | POA: Diagnosis not present

## 2019-11-03 DIAGNOSIS — Z48812 Encounter for surgical aftercare following surgery on the circulatory system: Secondary | ICD-10-CM | POA: Diagnosis not present

## 2019-11-03 DIAGNOSIS — I255 Ischemic cardiomyopathy: Secondary | ICD-10-CM | POA: Diagnosis not present

## 2019-11-03 DIAGNOSIS — I5043 Acute on chronic combined systolic (congestive) and diastolic (congestive) heart failure: Secondary | ICD-10-CM | POA: Diagnosis not present

## 2019-11-03 DIAGNOSIS — I2510 Atherosclerotic heart disease of native coronary artery without angina pectoris: Secondary | ICD-10-CM | POA: Diagnosis not present

## 2019-11-03 DIAGNOSIS — Z7982 Long term (current) use of aspirin: Secondary | ICD-10-CM | POA: Diagnosis not present

## 2019-11-03 NOTE — Progress Notes (Addendum)
[facility]                                       Cardiology Office Note    Date:  11/04/2019   ID:  Clinton Holmes, DOB 12/27/1927, MRN [id number]  PCP:  Clinton Sake, MD  Cardiologist:  Clinton Furbish, MD / Dr. Angelena Holmes & Dr. Cyndia Holmes (TAVR)  CC: TOC s/p TAVR  History of Present Illness:  Clinton Holmes is a 86 y.o. male with a history of acoustic neuroma with chronic balance issues, non obst CAD, hx of ocular stroke, PAF on amio & Eliquis (s/p DCCV in 08/2019), CKD stage III, HTN, PVCs, ischemic cardiomyopathyand severe LFLG aortic stenosis s/p TAVR (10/27/19) who presents to clinic for follow up.   He was admitted to [facility] in January 2021 with atrial fibrillation with rapid ventricular response as well as nausea and vomiting. An echocardiogram showed an ejection fraction of 25 to 30%. The aortic valve was calcified with restricted mobility.The mean gradient across the valve was 22 mmHg with a peak gradient of 41 mmHg.Aortic valve area was 0.65 cm with a dimensionless index of 0.14. This was felt to represent low flow, low gradient, severe aortic stenosis. He underwent TEE guided cardioversion and was started on Eliquis. He subsequently underwent cardiac catheterization on 09/23/2019 showing mild to moderate nonobstructive coronary disease in the proximal and mid LAD as well as the ramus branch.Right heart cath showed normal pulmonary artery pressures with a mean wedge pressure of 11 mmHg. CVP was normal. Cardiac index was 2.47.  The patient was evaluated by the multidisciplinary valve team and underwent successful TAVR with a 29 mm Edwards Sapien 3 THV via the left subclavian approach (due to aortoiliac disease) on 10/27/19. Post operative echo showed EF.60-65%, normally functioning TAVR with a mean gradient of 16 mm Hg and no PVL. Hospital course was c/b aifb with RVR and new LBBB. He spontaneously converted prior to DC. A  zio patch was placed to rule out late HAVB. He was resumed on his home Eliquis and aspirin at discharge.   Today he presents to clinic for follow up. Here with his daughter. He is not doing well on account of his new visual changes since TAVR. He has a history of a left ocular stoke from several years ago. Ever since his TAVR procedure last week he has developed right-sided blurriness and has difficulty seeing things around him and reading. He is also had some left hand numbness as well as some visual hallucinations. He says he has seen several things recently that he knows are not there. His breathing has been stable. He has not noticed a real change since TAVR. No CP. No LE edema, orthopnea or PND. No dizziness or syncope. No blood in stool or urine. No palpitations.   Past Medical History:  Diagnosis Date  . AAA (abdominal aortic aneurysm) ([place])   . CAD (coronary artery disease)    L&RHC 12/23/2014 elevated LVEDP, prox LAD 45%, 60% ramus  . CKD (chronic kidney disease), stage III   . Essential hypertension   . Ischemic cardiomyopathy   . OA (osteoarthritis)    a. 2011 s/p bilat TKA.  . Pneumonia    "about 3 years ago"  . S/P TAVR (transcatheter aortic valve replacement) 10/27/2019   s/p TAVR with a 29 mm Edwards Sapien  THV via the subclavian approach by Dr. Sanda Holmes & Dr. Cyndia Holmes  . Severe aortic stenosis   . Ventricular bigeminy    a. 12/2014. Started on amio on 12/24/2014    Past Surgical History:  Procedure Laterality Date  . CARDIAC CATHETERIZATION N/A 12/23/2014   Procedure: Right/Left Heart Cath and Coronary Angiography;  Surgeon: Belva Crome, MD;  Location: [facility];  Service: Cardiovascular;  Laterality: N/A;  . CARDIOVERSION N/A 08/19/2019   Procedure: CARDIOVERSION;  Surgeon: Minna Merritts, MD;  Location: [facility] ORS;  Service: Cardiovascular;  Laterality: N/A;  . KNEE SURGERY    . RIGHT/LEFT HEART CATH AND CORONARY ANGIOGRAPHY N/A 09/23/2019   Procedure: RIGHT/LEFT  HEART CATH AND CORONARY ANGIOGRAPHY;  Surgeon: Burnell Blanks, MD;  Location: [facility];  Service: Cardiovascular;  Laterality: N/A;  . SHOULDER SURGERY Left   . TEE WITHOUT CARDIOVERSION N/A 08/19/2019   Procedure: TRANSESOPHAGEAL ECHOCARDIOGRAM (TEE);  Surgeon: Minna Merritts, MD;  Location: [facility] ORS;  Service: Cardiovascular;  Laterality: N/A;  . TEE WITHOUT CARDIOVERSION N/A 10/27/2019   Procedure: TRANSESOPHAGEAL ECHOCARDIOGRAM (TEE);  Surgeon: Burnell Blanks, MD;  Location: [facility];  Service: Open Heart Surgery;  Laterality: N/A;  . TONSILLECTOMY      Current Medications: Outpatient Medications Prior to Visit  Medication Sig Dispense Refill  . amoxicillin (AMOXIL) 500 MG capsule Take 2,000 mg by mouth See admin instructions. Take 4 capsules (2000 mg) by mouth 1 hour prior to dental appointment    . apixaban (ELIQUIS) 2.5 MG TABS tablet Take 1 tablet (2.5 mg total) by mouth 2 (two) times daily. 60 tablet 6  . aspirin EC 81 MG EC tablet Take 1 tablet (81 mg total) by mouth daily.    . furosemide (LASIX) 40 MG tablet Take 1 tablet (40 mg total) by mouth daily. 60 tablet 6  . metoprolol succinate (TOPROL-XL) 50 MG 24 hr tablet Take 1 tablet (50 mg total) by mouth 2 (two) times daily. Take with or immediately following a meal. 60 tablet 6  . Multiple Vitamin (MULTIVITAMIN WITH MINERALS) TABS tablet Take 1 tablet by mouth daily. One-A-Day for Adults 50+    . tamsulosin (FLOMAX) 0.4 MG CAPS capsule Take 1 capsule (0.4 mg total) by mouth daily after breakfast. 30 capsule 1   No facility-administered medications prior to visit.     Allergies:   Patient has no known allergies.   Social History   Socioeconomic History  . Marital status: Divorced    Spouse name: Not on file  . Number of children: 5  . Years of education: Not on file  . Highest education level: Not on file  Occupational History  . Occupation: Retired-Farmer  Tobacco Use  . Smoking status: Former  Research scientist (life sciences)  . Smokeless tobacco: Never Used  . Tobacco comment: Smoked for a few yrs in the service.  Quit in 1968. Never a heavy smoker.  Substance and Sexual Activity  . Alcohol use: No    Alcohol/week: 0.0 standard drinks  . Drug use: No  . Sexual activity: Not on file  Other Topics Concern  . Not on file  Social History Narrative   Lives between [place] and [place] by himself.  He does not routinely exercise.   Social Determinants of Health   Financial Resource Strain:   . Difficulty of Paying Living Expenses:   Food Insecurity: No Food Insecurity  . Worried About Charity fundraiser in the Last Year: Never true  .  Ran Out of Food in the Last Year: Never true  Transportation Needs: No Transportation Needs  . Lack of Transportation (Medical): No  . Lack of Transportation (Non-Medical): No  Physical Activity:   . Days of Exercise per Week:   . Minutes of Exercise per Session:   Stress:   . Feeling of Stress :   Social Connections:   . Frequency of Communication with Friends and Family:   . Frequency of Social Gatherings with Friends and Family:   . Attends Religious Services:   . Active Member of Clubs or Organizations:   . Attends Archivist Meetings:   Marland Kitchen Marital Status:      Family History:  The patient's family history includes CAD in his mother; Congestive Heart Failure in his sister; Heart attack in his father; Other in his brother.     ROS:   Please see the history of present illness.    ROS All other systems reviewed and are negative.   PHYSICAL EXAM:   VS:  BP (!) 142/60   Pulse (!) 56   Ht 6' (1.829 m)   Wt 236 lb (107 kg)   SpO2 95%   BMI 32.01 kg/m    GEN: Well nourished, well developed, in no acute distress HEENT: normal Neck: no JVD or masses Cardiac: RRR; no murmurs, rubs, or gallops,no edema  Respiratory:  clear to auscultation bilaterally, normal work of breathing GI: soft, nontender, nondistended, + BS MS: no deformity or  atrophy Skin: warm and dry, no rash. Subclavian site and groin site are healing well.  Neuro:  Alert and Oriented x 3, Strength and sensation are intact Psych: euthymic mood, full affect   Wt Readings from Last 3 Encounters:  11/04/19 236 lb (107 kg)  10/28/19 239 lb 6.7 oz (108.6 kg)  10/23/19 240 lb 2 oz (108.9 kg)      Studies/Labs Reviewed:   EKG:  EKG is ordered today.  The ekg ordered today demonstrates sinus brady with first deg AV block and LBBB HR 56 and PVCs   Recent Labs: 08/15/2019: TSH 0.802 10/23/2019: ALT 31; B Natriuretic Peptide 708.2 10/28/2019: BUN 21; Creatinine, Ser 1.42; Hemoglobin 12.6; Magnesium 2.0; Platelets 138; Potassium 4.0; Sodium 141   Lipid Panel    Component Value Date/Time   CHOL 148 07/24/2018 1531   TRIG 41 07/24/2018 1531   HDL 37 (L) 07/24/2018 1531   CHOLHDL 4.0 07/24/2018 1531   VLDL 8 07/24/2018 1531   LDLCALC 103 (H) 07/24/2018 1531    Additional studies/ records that were reviewed today include:  TAVR OPERATIVE NOTE   Date of Procedure:10/27/2019  Preoperative Diagnosis:Severe Aortic Stenosis   Postoperative Diagnosis:Same   Procedure:   Transcatheter Aortic Valve Replacement -Left subclavian artery approach Edwards Sapien 3 THV (size 3mm, model # 9600TFX, serial [id number])  Co-Surgeons:Bryan Alveria Apley, MD and Lauree Chandler, MD   Anesthesiologist:S. Gifford Shave, MD  Anne Hahn, MD  Pre-operative Echo Findings: ? Severe aortic stenosis ? Normalleft ventricular systolic function  Post-operative Echo Findings: ? Mildparavalvular leak ? Normalleft ventricular systolic function  _____________   Echo 10/28/19: IMPRESSIONS  1. Day 1 post TAVR, normal transaortic gradients with peak/mean gradients  26/16 mmHg. Only trivial paravalvular leak.  2. Left ventricular ejection  fraction, by estimation, is 60 to 65%. The  left ventricle has normal function. The left ventricle has no regional  wall motion abnormalities. There is mild concentric left ventricular  hypertrophy. Left ventricular diastolic  parameters were normal. Elevated left  atrial pressure.  3. Right ventricular systolic function is normal. The right ventricular  size is normal. There is moderately elevated pulmonary artery systolic  pressure. The estimated right ventricular systolic pressure is 123456 mmHg.  4. Left atrial size was mildly dilated.  5. The mitral valve is normal in structure. No evidence of mitral valve  regurgitation. No evidence of mitral stenosis.  6. Tricuspid valve regurgitation is moderate.  7. The aortic valve has been repaired/replaced. Aortic valve  regurgitation is not visualized. No aortic stenosis is present. There is a  29 mm Edwards Sapien prosthetic (TAVR) valve present in the aortic  position. Procedure Date: 10/27/2019. Aortic valve  mean gradient measures 16.0 mmHg.  8. Aortic dilatation noted. There is mild dilatation of the ascending  aorta.  9. The inferior vena cava is normal in size with greater than 50%  respiratory variability, suggesting right atrial pressure of 3 mmHg.    ASSESSMENT & PLAN:   LFLG severe AS s/p TAVR: groin site and subclavian site are stable.  ECG with no HAVB. Continue on aspirin and Eliquis 2.5 mg twice daily.  He has amoxicillin for SBE prophylaxis.  I will see him back next month for follow-up and echocardiogram.  Visual impairment: the patient is not doing well on account of visual impairment after TAVR.  He had a history of left ocular stroke several years ago with persistent visual disturbance.  After TAVR he noted right eye blurriness and now has difficulty seeing and reading.  He has also noted some left hand tingling as well as visual hallucinations. ?  Postop CVA. He also had afib with RVR for a few hours post operatively.  Given these new neurological changes, I will get a MRI without contrast today.  Also will put in a referral to neurology.  New LBBB: ECG today shows LBBB and first-degree AV block.  He is currently wearing a Zio patch which I interrogated today.  There has been no evidence of high degree AV block.  Given the need for MRI we have remove the Zio patch early and will return it to the company.  HTN: Blood pressure mildly elevated today. Will start amlodipine 5 mg daily.  Of note, he is no longer having urinary retention.  Flomax will be discontinued.  PAF: Maintaining sinus rhythm by EKG.  Continue Eliquis 2.5 mg twice daily  Chronic combined S/D CHF: Appears euvolemic.  Continue Lasix 40 mg daily.  AAA: pre TAVR CT showed a stable 3.4 cm infrarenal abdominal aortic aneurysm. Recommend follow-up aortic ultrasound in 3 years. Likely will not cause any issues given advanced age. This can be followed over time.   Medication Adjustments/Labs and Tests Ordered: Current medicines are reviewed at length with the patient today.  Concerns regarding medicines are outlined above.  Medication changes, Labs and Tests ordered today are listed in the Patient Instructions below. Patient Instructions  Medication Instructions:  1) STOP FLOMAX 2) START AMLODIPINE 5 mg daily  Testing/Procedures: Joellen Jersey recommends you have a brain MRI STAT.  Follow-Up: You have been referred to NEUROLOGY STAT for vision loss, tingling sensation, and hallucinations.  Please keep your other appointments as scheduled.         Signed, Clinton Form, PA-C  11/04/2019 4:11 PM    [facility] [address], [place], [place]  [address] Phone: [phone]; Fax: [phone]

## 2019-11-04 ENCOUNTER — Other Ambulatory Visit: Payer: Self-pay

## 2019-11-04 ENCOUNTER — Ambulatory Visit (INDEPENDENT_AMBULATORY_CARE_PROVIDER_SITE_OTHER): Payer: Medicare Other | Admitting: Physician Assistant

## 2019-11-04 ENCOUNTER — Ambulatory Visit (HOSPITAL_COMMUNITY)
Admission: RE | Admit: 2019-11-04 | Discharge: 2019-11-04 | Disposition: A | Discharge status: Home / Self Care | Payer: Medicare Other | Source: Ambulatory Visit | Attending: Physician Assistant | Admitting: Physician Assistant

## 2019-11-04 ENCOUNTER — Encounter: Payer: Self-pay | Admitting: Physician Assistant

## 2019-11-04 VITALS — BP 142/60 | HR 56 | Ht 72.0 in | Wt 236.0 lb

## 2019-11-04 DIAGNOSIS — Z952 Presence of prosthetic heart valve: Secondary | ICD-10-CM

## 2019-11-04 DIAGNOSIS — I5042 Chronic combined systolic (congestive) and diastolic (congestive) heart failure: Secondary | ICD-10-CM | POA: Diagnosis not present

## 2019-11-04 DIAGNOSIS — I714 Abdominal aortic aneurysm, without rupture, unspecified: Secondary | ICD-10-CM

## 2019-11-04 DIAGNOSIS — I480 Paroxysmal atrial fibrillation: Secondary | ICD-10-CM | POA: Diagnosis not present

## 2019-11-04 DIAGNOSIS — N189 Chronic kidney disease, unspecified: Secondary | ICD-10-CM | POA: Diagnosis not present

## 2019-11-04 DIAGNOSIS — I447 Left bundle-branch block, unspecified: Secondary | ICD-10-CM

## 2019-11-04 DIAGNOSIS — R29818 Other symptoms and signs involving the nervous system: Secondary | ICD-10-CM

## 2019-11-04 DIAGNOSIS — I619 Nontraumatic intracerebral hemorrhage, unspecified: Secondary | ICD-10-CM | POA: Diagnosis not present

## 2019-11-04 DIAGNOSIS — H547 Unspecified visual loss: Secondary | ICD-10-CM | POA: Diagnosis not present

## 2019-11-04 DIAGNOSIS — I10 Essential (primary) hypertension: Secondary | ICD-10-CM | POA: Diagnosis not present

## 2019-11-04 MED ORDER — AMLODIPINE BESYLATE 5 MG PO TABS: 5.0000 mg | ORAL_TABLET | Freq: Every day | ORAL | 3 refills | Status: AC

## 2019-11-04 NOTE — Patient Instructions (Addendum)
Medication Instructions:  1) STOP FLOMAX 2) START AMLODIPINE 5 mg daily  Testing/Procedures: Joellen Jersey recommends you have a brain MRI STAT.  Follow-Up: You have been referred to NEUROLOGY STAT for vision loss, tingling sensation, and hallucinations.  Please keep your other appointments as scheduled.

## 2019-11-05 ENCOUNTER — Other Ambulatory Visit: Payer: Self-pay | Admitting: *Deleted

## 2019-11-05 DIAGNOSIS — Z952 Presence of prosthetic heart valve: Secondary | ICD-10-CM | POA: Diagnosis not present

## 2019-11-05 DIAGNOSIS — Z79899 Other long term (current) drug therapy: Secondary | ICD-10-CM | POA: Diagnosis not present

## 2019-11-05 DIAGNOSIS — I6350 Cerebral infarction due to unspecified occlusion or stenosis of unspecified cerebral artery: Secondary | ICD-10-CM | POA: Diagnosis not present

## 2019-11-05 DIAGNOSIS — I509 Heart failure, unspecified: Secondary | ICD-10-CM | POA: Diagnosis not present

## 2019-11-05 DIAGNOSIS — I350 Nonrheumatic aortic (valve) stenosis: Secondary | ICD-10-CM | POA: Diagnosis not present

## 2019-11-05 NOTE — Patient Outreach (Signed)
[facility]) Care Management  11/05/2019  Clinton Holmes 1928-03-20 [id number]   Subjective: Telephone call to patient's home / mobile number, spoke with patient, and HIPAA verified.  Discussed [facility] Care Management Medicare  [facility] Liaison referral follow up, patient voiced understanding, and requested this RNCM complete assessment with daughter / designated party release Clinton Holmes) at a later time, because his memory is not too good.   States his daughter just left his house and can be reach at a later time.     Objective: Per KPN (Knowledge Performance Now, point of care tool) and chart review, patient hospitalized  10/27/2019 - 10/28/2019 for Severe Aortic Stenosis status post TAVR (transcatheter aortic valve replacement).   Patient also has a history of CKD (chronic kidney disease), stage III, acoustic neuroma , Gait instability, Acute on chronic systolic heart failure,  PVC's (premature ventricular contractions, Persistent atrial fibrillation, Ischemic cardiomyopathy, AAA (abdominal aortic aneurysm), CAD, New LBBB, and hypertension.    Assessment: Received  Medicare [facility] Liaison follow up referral on 10/28/2019.   Referral source: Natividad Brood.    Referral reason: Please assign to [place] Coordinator for complex care and disease management follow up calls and assess for return to [facility] when appropriate for further needs. Reason for consult status post scheduled TAVR follow up, was active with [facility] prior to admission.  Diagnoses of Other   Other Diagnosis: TAVR follow up.   Screening  follow up pending patient contact.     Plan: RNCM will send unsuccessful outreach  letter, [facility] pamphlet, will call patient's daughter for 2nd telephone outreach attempt within 4 business days, screening follow up, and will proceed with case closure within 10 business days if no return call.     Clinton Holmes H. Annia Friendly, BSN, [facility] Telephonic CM Phone: [phone] Fax: [phone]

## 2019-11-06 ENCOUNTER — Telehealth: Payer: Self-pay | Admitting: Cardiovascular Disease

## 2019-11-06 ENCOUNTER — Telehealth: Payer: Self-pay | Admitting: Cardiology

## 2019-11-06 DIAGNOSIS — I2510 Atherosclerotic heart disease of native coronary artery without angina pectoris: Secondary | ICD-10-CM | POA: Diagnosis not present

## 2019-11-06 DIAGNOSIS — N183 Chronic kidney disease, stage 3 unspecified: Secondary | ICD-10-CM | POA: Diagnosis not present

## 2019-11-06 DIAGNOSIS — I255 Ischemic cardiomyopathy: Secondary | ICD-10-CM | POA: Diagnosis not present

## 2019-11-06 DIAGNOSIS — Z48812 Encounter for surgical aftercare following surgery on the circulatory system: Secondary | ICD-10-CM | POA: Diagnosis not present

## 2019-11-06 DIAGNOSIS — I130 Hypertensive heart and chronic kidney disease with heart failure and stage 1 through stage 4 chronic kidney disease, or unspecified chronic kidney disease: Secondary | ICD-10-CM | POA: Diagnosis not present

## 2019-11-06 DIAGNOSIS — I5043 Acute on chronic combined systolic (congestive) and diastolic (congestive) heart failure: Secondary | ICD-10-CM | POA: Diagnosis not present

## 2019-11-06 NOTE — Telephone Encounter (Signed)
Thanks

## 2019-11-06 NOTE — Telephone Encounter (Signed)
   Katharine Look from [facility] calling, she would like to leave a message to Dr. Marlou Porch, she said pt wore ZIO AT heart monitor for a week, had MRI done and forgot the patch at the doctor's office and according to pt's daughter patch will be mailed to them.  Please advise

## 2019-11-06 NOTE — Telephone Encounter (Signed)
Thank you noted.

## 2019-11-06 NOTE — Telephone Encounter (Signed)
° °  Kayla occupational therapist from [facility] calling, she visit pt today and would like to report to Dr. Angelena Form, pt's radial pulse is at 9 and BP 168/92. According to pt he feels fine but she just wanted for the doctor to know.  Please advise

## 2019-11-09 ENCOUNTER — Other Ambulatory Visit: Payer: Self-pay

## 2019-11-09 ENCOUNTER — Telehealth: Payer: Self-pay | Admitting: Diagnostic Neuroimaging

## 2019-11-09 ENCOUNTER — Telehealth: Payer: Self-pay | Admitting: Cardiovascular Disease

## 2019-11-09 ENCOUNTER — Ambulatory Visit (INDEPENDENT_AMBULATORY_CARE_PROVIDER_SITE_OTHER): Payer: Medicare Other | Admitting: Diagnostic Neuroimaging

## 2019-11-09 ENCOUNTER — Encounter: Payer: Self-pay | Admitting: Diagnostic Neuroimaging

## 2019-11-09 VITALS — BP 148/86 | HR 52 | Temp 98.2°F | Ht 72.0 in | Wt 236.0 lb

## 2019-11-09 DIAGNOSIS — I130 Hypertensive heart and chronic kidney disease with heart failure and stage 1 through stage 4 chronic kidney disease, or unspecified chronic kidney disease: Secondary | ICD-10-CM | POA: Diagnosis not present

## 2019-11-09 DIAGNOSIS — I2510 Atherosclerotic heart disease of native coronary artery without angina pectoris: Secondary | ICD-10-CM

## 2019-11-09 DIAGNOSIS — N183 Chronic kidney disease, stage 3 unspecified: Secondary | ICD-10-CM | POA: Diagnosis not present

## 2019-11-09 DIAGNOSIS — I63431 Cerebral infarction due to embolism of right posterior cerebral artery: Secondary | ICD-10-CM

## 2019-11-09 DIAGNOSIS — I5043 Acute on chronic combined systolic (congestive) and diastolic (congestive) heart failure: Secondary | ICD-10-CM | POA: Diagnosis not present

## 2019-11-09 DIAGNOSIS — Z48812 Encounter for surgical aftercare following surgery on the circulatory system: Secondary | ICD-10-CM | POA: Diagnosis not present

## 2019-11-09 DIAGNOSIS — I255 Ischemic cardiomyopathy: Secondary | ICD-10-CM | POA: Diagnosis not present

## 2019-11-09 NOTE — Telephone Encounter (Signed)
Medicare/bcbs supp/medicaid order sent to GI. No auth they will reach out to the patient to schedule.

## 2019-11-09 NOTE — Telephone Encounter (Signed)
[facility] nurse calling to request verbal orders for Physical therapy for 2x/3 weeks, 1x/ 5 weeks  Please call [facility] at [phone] to give orders or leave voicemail

## 2019-11-09 NOTE — Progress Notes (Signed)
[facility]  PATIENT: Clinton Holmes DOB: 1927/12/09  REFERRING CLINICIAN: Eileen Stanford, PA* HISTORY FROM: patient and daughter  REASON FOR VISIT: new consult    HISTORICAL  CHIEF COMPLAINT:  Chief Complaint  Patient presents with  . Visual Field Change    rm 7 New Pt  dgtr- Debbie "right vision changes after heart valve replacement surgery 10/27/19"    HISTORY OF PRESENT ILLNESS:   86 year old male here for evaluation of stroke.  History of left vestibular schwannoma, CAD, atrial fibrillation, hypertension, cardiomyopathy, aortic stenosis.  January 2021 patient was hospitalized with atrial fibrillation and rapid ventricular response.  He was found to have severe aortic stenosis.  Patient underwent cardioversion and was started on Eliquis.  Patient was scheduled for valve replacement and on 10/27/2019 patient had transcatheter aortic valve replacement (TAVR).  Patient was discharged home on aspirin Eliquis.  On 10/28/2019 patient reported to daughter that he was having some trouble seeing.  He has chronic left eye inferior visual field defect from prior stroke to the left eye.  However he was having new onset problems of visual difficulty, not able to see a person who was standing in front of him.  He followed up in clinic on 11/04/2019 and MRI of the brain was ordered which showed multiple embolic infarcts in the right occipital and cerebellar regions.  Eliquis was stopped due to presence of some mild hemorrhagic transformation.  Patient was referred here for further evaluation.  Patient also was having some increased generalized weakness and gait difficulty.  These have returned to baseline.  His vision problems are persistent.  He is having trouble reading.   REVIEW OF SYSTEMS: Full 14 system review of systems performed and negative with exception of: As per HPI.  ALLERGIES: No Known Allergies  HOME MEDICATIONS: Outpatient Medications Prior to Visit    Medication Sig Dispense Refill  . amLODipine (NORVASC) 5 MG tablet Take 1 tablet (5 mg total) by mouth daily. 90 tablet 3  . aspirin EC 81 MG EC tablet Take 1 tablet (81 mg total) by mouth daily.    . furosemide (LASIX) 40 MG tablet Take 1 tablet (40 mg total) by mouth daily. 60 tablet 6  . metoprolol succinate (TOPROL-XL) 50 MG 24 hr tablet Take 1 tablet (50 mg total) by mouth 2 (two) times daily. Take with or immediately following a meal. 60 tablet 6  . Multiple Vitamin (MULTIVITAMIN WITH MINERALS) TABS tablet Take 1 tablet by mouth daily. One-A-Day for Adults 50+    . amoxicillin (AMOXIL) 500 MG capsule Take 2,000 mg by mouth See admin instructions. Take 4 capsules (2000 mg) by mouth 1 hour prior to dental appointment    . apixaban (ELIQUIS) 2.5 MG TABS tablet Take 1 tablet (2.5 mg total) by mouth 2 (two) times daily. 60 tablet 6   No facility-administered medications prior to visit.    PAST MEDICAL HISTORY: Past Medical History:  Diagnosis Date  . AAA (abdominal aortic aneurysm) ([place])   . CAD (coronary artery disease)    L&RHC 12/23/2014 elevated LVEDP, prox LAD 45%, 60% ramus  . CKD (chronic kidney disease), stage III   . Essential hypertension   . Ischemic cardiomyopathy   . OA (osteoarthritis)    a. 2011 s/p bilat TKA.  . Pneumonia    "about 3 years ago"  . S/P TAVR (transcatheter aortic valve replacement) 10/27/2019   s/p TAVR with a 29 mm Edwards Sapien THV via the subclavian approach by Dr.  McAlhanhy & Dr. Cyndia Bent  . Severe aortic stenosis   . Stroke [facility])    in left eye  . Ventricular bigeminy    a. 12/2014. Started on amio on 12/24/2014    PAST SURGICAL HISTORY: Past Surgical History:  Procedure Laterality Date  . CARDIAC CATHETERIZATION N/A 12/23/2014   Procedure: Right/Left Heart Cath and Coronary Angiography;  Surgeon: Belva Crome, MD;  Location: [facility];  Service: Cardiovascular;  Laterality: N/A;  . CARDIAC VALVE REPLACEMENT  10/27/2019  .  CARDIOVERSION N/A 08/19/2019   Procedure: CARDIOVERSION;  Surgeon: Minna Merritts, MD;  Location: [facility] OR[facility];  Service: Cardiovascular;  Laterality: N/A;  . KNEE SURGERY    . RIGHT/LEFT HEART CATH AND CORONARY ANGIOGRAPHY N/A 09/23/2019   Procedure: RIGHT/LEFT HEART CATH AND CORONARY ANGIOGRAPHY;  Surgeon: Burnell Blanks, MD;  Location: [facility];  Service: Cardiovascular;  Laterality: N/A;  . SHOULDER SURGERY Left   . TEE WITHOUT CARDIOVERSION N/A 08/19/2019   Procedure: TRANSESOPHAGEAL ECHOCARDIOGRAM (TEE);  Surgeon: Minna Merritts, MD;  Location: [facility] OR[facility];  Service: Cardiovascular;  Laterality: N/A;  . TEE WITHOUT CARDIOVERSION N/A 10/27/2019   Procedure: TRANSESOPHAGEAL ECHOCARDIOGRAM (TEE);  Surgeon: Burnell Blanks, MD;  Location: [facility];  Service: Open Heart Surgery;  Laterality: N/A;  . TONSILLECTOMY      FAMILY HISTORY: Family History  Problem Relation Age of Onset  . Heart attack Father        deceased @ 55.  . Congestive Heart Failure Sister        alive in her late 31's.  . Other Brother        alive @ 47.  Marland Kitchen CAD Mother        H/o CABG, deceased @ 25.    SOCIAL HISTORY: Social History   Socioeconomic History  . Marital status: Divorced    Spouse name: Not on file  . Number of children: 5  . Years of education: Not on file  . Highest education level: High school graduate  Occupational History  . Occupation: Retired-Farmer  Tobacco Use  . Smoking status: Former Research scientist (life sciences)  . Smokeless tobacco: Never Used  . Tobacco comment: Smoked for a few yrs in the service.  Quit in 1968. Never a heavy smoker.  Substance and Sexual Activity  . Alcohol use: No    Alcohol/week: 0.0 standard drinks  . Drug use: No  . Sexual activity: Not on file  Other Topics Concern  . Not on file  Social History Narrative   11/09/19 lives alone, Lives between [place] and [place] by himself.  Son and daughter live at farm.    He does not routinely exercise.   Social  Determinants of Health   Financial Resource Strain:   . Difficulty of Paying Living Expenses:   Food Insecurity: No Food Insecurity  . Worried About Charity fundraiser in the Last Year: Never true  . Ran Out of Food in the Last Year: Never true  Transportation Needs: No Transportation Needs  . Lack of Transportation (Medical): No  . Lack of Transportation (Non-Medical): No  Physical Activity:   . Days of Exercise per Week:   . Minutes of Exercise per Session:   Stress:   . Feeling of Stress :   Social Connections:   . Frequency of Communication with Friends and Family:   . Frequency of Social Gatherings with Friends and Family:   . Attends Religious Services:   . Active Member of  Clubs or Organizations:   . Attends Archivist Meetings:   Marland Kitchen Marital Status:   Intimate Partner Violence:   . Fear of Current or Ex-Partner:   . Emotionally Abused:   Marland Kitchen Physically Abused:   . Sexually Abused:      PHYSICAL EXAM  GENERAL EXAM/CONSTITUTIONAL: Vitals:  Vitals:   11/09/19 0920  BP: (!) 148/86  Pulse: (!) 52  Temp: 98.2 F (36.8 C)  Weight: 236 lb (107 kg)  Height: 6' (1.829 m)     Body mass index is 32.01 kg/m. Wt Readings from Last 3 Encounters:  11/09/19 236 lb (107 kg)  11/04/19 236 lb (107 kg)  10/28/19 239 lb 6.7 oz (108.6 kg)     Patient is in no distress; well developed, nourished and groomed; neck is supple  CARDIOVASCULAR:  Examination of carotid arteries is normal; no carotid bruits  Regular rate and rhythm, no murmurs  Examination of peripheral vascular system by observation and palpation is normal  EYES:  Ophthalmoscopic exam of optic discs and posterior segments is normal; no papilledema or hemorrhages No exam data present  MUSCULOSKELETAL:  Gait, strength, tone, movements noted in Neurologic exam below  NEUROLOGIC: MENTAL STATUS:  No flowsheet data found.  awake, alert, oriented to person, place and time  recent and remote  memory intact  normal attention and concentration  language fluent, comprehension intact, naming intact  fund of knowledge appropriate  CRANIAL NERVE:   2nd - no papilledema on fundoscopic exam  2nd, 3rd, 4th, 6th - pupils equal and reactive to light, visual fields --> LEFT EYE LIMITED INFERIOR QUADRANTS; RIGHT EYE FULL TO FINGER COUNTING  5th - facial sensation symmetric  7th - facial strength symmetric  8th - hearing DECR  9th - palate elevates symmetrically, uvula midline  11th - shoulder shrug symmetric  12th - tongue protrusion midline  MOTOR:   normal bulk and tone, full strength in the BUE, BLE; EXCEPT BILATERAL HF 4  SENSORY:   normal and symmetric to light touch  COORDINATION:   finger-nose-finger, fine finger movements --> MILD DYSMETRIA ON LEFT  REFLEXES:   deep tendon reflexes TRACE and symmetric  GAIT/STATION:   UNSTEADY; IN WHEELCHAIR     DIAGNOSTIC DATA (LABS, IMAGING, TESTING) - I reviewed patient records, labs, notes, testing and imaging myself where available.  Lab Results  Component Value Date   WBC 9.6 10/28/2019   HGB 12.6 (L) 10/28/2019   HCT 38.4 (L) 10/28/2019   MCV 91.4 10/28/2019   PLT 138 (L) 10/28/2019      Component Value Date/Time   NA 141 10/28/2019 0340   NA 142 09/09/2019 1606   K 4.0 10/28/2019 0340   CL 106 10/28/2019 0340   CO2 23 10/28/2019 0340   GLUCOSE 121 (H) 10/28/2019 0340   BUN 21 10/28/2019 0340   BUN 35 (H) 09/09/2019 1606   CREATININE 1.42 (H) 10/28/2019 0340   CALCIUM 9.3 10/28/2019 0340   PROT 6.6 10/23/2019 1400   ALBUMIN 4.1 10/23/2019 1400   AST 32 10/23/2019 1400   ALT 31 10/23/2019 1400   ALKPHOS 55 10/23/2019 1400   BILITOT 0.7 10/23/2019 1400   GFRNONAA 43 (L) 10/28/2019 0340   GFRAA 50 (L) 10/28/2019 0340   Lab Results  Component Value Date   CHOL 148 07/24/2018   HDL 37 (L) 07/24/2018   LDLCALC 103 (H) 07/24/2018   TRIG 41 07/24/2018   CHOLHDL 4.0 07/24/2018   Lab Results   Component Value Date  HGBA1C 5.3 10/23/2019   No results found for: VITAMINB12 Lab Results  Component Value Date   TSH 0.802 08/15/2019    11/04/19 MRI brain [I reviewed images myself and agree with interpretation. -VRP]  1. Scattered, widespread acute infarcts in the bilateral posterior circulation - perhaps related to a recent posterior circulation embolic shower. Petechial hemorrhage at the most confluent infarcts involving the right occiput and bilateral cerebellum. But no malignant hemorrhagic transformation or intracranial mass effect. 2. Underlying chronic small vessel disease and generalized intracranial artery dolichoectasia. 3. Stable since 2017 small chronic left internal auditory canal mass compatible with vestibular schwannoma.    ASSESSMENT AND PLAN  86 y.o. year old male here with new onset embolic infarcts, possibly periprocedural after transcatheter aortic valve replacement on 10/27/2019.  Overall patient is gradually recovering and stable.  Mild petechial hemorrhage noted on MRI scan from 11/04/2019.  We will repeat CT scan of the head to follow this up and then patient may be able to restart his anticoagulation for atrial fibrillation.  Dx:  1. Cerebrovascular accident (CVA) due to embolism of right posterior cerebral artery (HCC)     PLAN:  EMBOLIC STROKE (peri-procedural; s/p TAVR 10/27/19) - check CT head later this week to ensure resolution of hemorrhagic transformation; then may consider to restart eliquis if hyperdensities are resolved - could consider statin, although given patient's age and mechanism of stroke, unclear long term benefit - continue BP control  Orders Placed This Encounter  Procedures  . CT HEAD WO CONTRAST   Return pending test results, for pending if symptoms worsen or fail to improve.    Penni Bombard, MD [id number], [id number] AM Certified in Neurology, Neurophysiology and Neuroimaging  [facility] [address] [place], [place] [address] [phone]

## 2019-11-09 NOTE — Patient Instructions (Signed)
-   check CT head 

## 2019-11-10 DIAGNOSIS — I2510 Atherosclerotic heart disease of native coronary artery without angina pectoris: Secondary | ICD-10-CM | POA: Diagnosis not present

## 2019-11-10 DIAGNOSIS — Z48812 Encounter for surgical aftercare following surgery on the circulatory system: Secondary | ICD-10-CM | POA: Diagnosis not present

## 2019-11-10 DIAGNOSIS — I130 Hypertensive heart and chronic kidney disease with heart failure and stage 1 through stage 4 chronic kidney disease, or unspecified chronic kidney disease: Secondary | ICD-10-CM | POA: Diagnosis not present

## 2019-11-10 DIAGNOSIS — N183 Chronic kidney disease, stage 3 unspecified: Secondary | ICD-10-CM | POA: Diagnosis not present

## 2019-11-10 DIAGNOSIS — I5043 Acute on chronic combined systolic (congestive) and diastolic (congestive) heart failure: Secondary | ICD-10-CM | POA: Diagnosis not present

## 2019-11-10 DIAGNOSIS — I255 Ischemic cardiomyopathy: Secondary | ICD-10-CM | POA: Diagnosis not present

## 2019-11-10 NOTE — Telephone Encounter (Signed)
Spoke with [facility] nurse and reviewed that would be fine and provided verbal order for services. She verbalized understanding with no further questions.

## 2019-11-11 ENCOUNTER — Other Ambulatory Visit: Payer: Self-pay | Admitting: *Deleted

## 2019-11-11 ENCOUNTER — Encounter: Payer: Self-pay | Admitting: *Deleted

## 2019-11-11 NOTE — Patient Outreach (Signed)
[facility]) Care Management  11/11/2019  Clinton Holmes 08-12-1928 [id number]   Subjective: Telephone call to patient's daughter / designated party release  Clinton Holmes)  mobile number, spoke with daughter, stated patient's name, date of birth, and address.  Discussed [facility] Care Management Medicare [facility] Liaison referral  follow up, daughter voiced understanding, and is in agreement to follow up on patient's behalf.    States patient is doing well over all, had a mild stroke confirmed through MRI on 11/04/2019, after recent 10/27/2019 procedure which has impacted his vision more than previous stroke in left eye.  States he is currently having multiple vision (double, triple at times), MD is aware, has ordered further testing, will have CT Scan on 11/20/2019 ordered by neurologist, and follow up appointment with cardiologist on 11/26/2019.  States  has had hospital follow up appointments with the following providers: on 11/04/2019 with cardiologist, and 11/09/2019 with neurologist.  Daughter states she is planning to call neurologist today to advise no sooner CT Scan appointment could be obtained and ask for recommendation since provider wanted to the scan to be done as soon as possible.   Daughter states patient is able to manage some self care and has assistance as needed. States patient has 20 - 24 hour supervision, she or her siblings will stay with patient at night, and may be left alone for a few hours during the day, so they can take care of other things.  Clinton Poling states she is currently retired and her siblings are still working.   Daughter states she is aware of signs/ symptoms to report, how to reach provider if needed after hours, when to take patient to ED, and / or call 911.  Daughter voices understanding of patient's medical diagnosis and treatment plan.   States patient was diagnosed with congestive heart failure December of 2019 and Atrial Fibrillation January  of 2021.   States patient has had both of his Covid 19 vaccines as of 10/02/2019.  States she continues to work with patient on adhering to his low salt diet and patient continues to eat his $5 [employer] fill up meal periodically.   States patient remains active, cognitively strong, knows what he wants, and stopped driving in December of 2019 due to vision issues.   Medication review completed and daughter states Eliquis is currently on hold pending MD follow up.  States she is accessing patient's Medicare benefits as needed on patient's behalf via member services number on back of card.   Daughter  states patient  does not have any education material, transition of care, care coordination, disease management, disease monitoring, transportation, community resource, or pharmacy needs at this time.  States she is very appreciative of the follow up, is in agreement  to receive 1 additional follow up call to assess for further CM needs on patient's behalf, after patient's MD follow up appointments,  and is in agreement to receive [facility] Management information.    Objective: Per KPN (Knowledge Performance Now, point of care tool) and chart review, patient hospitalized  10/27/2019 - 10/28/2019 for Severe Aortic Stenosis status post TAVR (transcatheter aortic valve replacement).   Patient also has a history of CKD (chronic kidney disease), stage III, acoustic neuroma , Gait instability, Acute on chronic systolic heart failure, PVC's (premature ventricular contractions, Persistent atrial fibrillation, Ischemic cardiomyopathy, AAA (abdominal aortic aneurysm), CAD, New LBBB, and hypertension.    Assessment: Received  [facility] Liaison  follow up referral on 10/28/2019.   Referral source: Natividad Brood.    Referral reason: Please assign to [place] Coordinator for complex care and disease management follow up calls and assess for return to [facility] when appropriate for further needs.  Reason for consult status post scheduled TAVR follow up, was active with [facility] prior to admission.  Diagnoses of Other   Other Diagnosis: TAVR follow up.   Screening follow up completed and will follow up to assess care management needs.     Plan: RNCM will call patient's daughter for telephone outreach attempt within 21 business days, assess care management needs, and will proceed with case closure within 10 business days if no return call after 3rd unsuccessful outreach attempt.      Aroura Vasudevan H. Annia Friendly, BSN, [facility] Telephonic CM Phone: [phone] Fax: [phone]

## 2019-11-12 DIAGNOSIS — Z48812 Encounter for surgical aftercare following surgery on the circulatory system: Secondary | ICD-10-CM | POA: Diagnosis not present

## 2019-11-12 DIAGNOSIS — I130 Hypertensive heart and chronic kidney disease with heart failure and stage 1 through stage 4 chronic kidney disease, or unspecified chronic kidney disease: Secondary | ICD-10-CM | POA: Diagnosis not present

## 2019-11-12 DIAGNOSIS — I5043 Acute on chronic combined systolic (congestive) and diastolic (congestive) heart failure: Secondary | ICD-10-CM | POA: Diagnosis not present

## 2019-11-12 DIAGNOSIS — N183 Chronic kidney disease, stage 3 unspecified: Secondary | ICD-10-CM | POA: Diagnosis not present

## 2019-11-12 DIAGNOSIS — I2510 Atherosclerotic heart disease of native coronary artery without angina pectoris: Secondary | ICD-10-CM | POA: Diagnosis not present

## 2019-11-12 DIAGNOSIS — I255 Ischemic cardiomyopathy: Secondary | ICD-10-CM | POA: Diagnosis not present

## 2019-11-13 ENCOUNTER — Telehealth: Payer: Self-pay | Admitting: Cardiovascular Disease

## 2019-11-13 DIAGNOSIS — N183 Chronic kidney disease, stage 3 unspecified: Secondary | ICD-10-CM | POA: Diagnosis not present

## 2019-11-13 DIAGNOSIS — I2510 Atherosclerotic heart disease of native coronary artery without angina pectoris: Secondary | ICD-10-CM | POA: Diagnosis not present

## 2019-11-13 DIAGNOSIS — Z48812 Encounter for surgical aftercare following surgery on the circulatory system: Secondary | ICD-10-CM | POA: Diagnosis not present

## 2019-11-13 DIAGNOSIS — I255 Ischemic cardiomyopathy: Secondary | ICD-10-CM | POA: Diagnosis not present

## 2019-11-13 DIAGNOSIS — I130 Hypertensive heart and chronic kidney disease with heart failure and stage 1 through stage 4 chronic kidney disease, or unspecified chronic kidney disease: Secondary | ICD-10-CM | POA: Diagnosis not present

## 2019-11-13 DIAGNOSIS — I5043 Acute on chronic combined systolic (congestive) and diastolic (congestive) heart failure: Secondary | ICD-10-CM | POA: Diagnosis not present

## 2019-11-13 NOTE — Telephone Encounter (Signed)
STAT if HR is under 50 or over 120 (normal HR is 60-100 beats per minute)  1) What is your heart rate? 47  2) Do you have a log of your heart rate readings (document readings)? Yesterday 48 , Wednesday 55  3) Do you have any other symptoms? No symptoms   Home Health Nurse is currently with patient.

## 2019-11-13 NOTE — Telephone Encounter (Signed)
Returned the call to the patients Clinton Collier [place], RN with [facility].  Patients resting heartrate 46bpm. Nurse sts that the patient says he feels fine and has no complaints. With activity the patients HR got up to 52 bpm. Patients BP today 150/82.  [name] that I will fwd the update to Dr. Rockey Situ and that we will callback if needed.

## 2019-11-14 NOTE — Telephone Encounter (Signed)
Difficult to tell but I think Dr. Fletcher Anon is his primary cardiologist I think bradycardia is well known Previously seen Dr. Saunders Revel in January for bradycardia Recent phone call to Dr. Angelena Form for bradycardia

## 2019-11-16 DIAGNOSIS — Z48812 Encounter for surgical aftercare following surgery on the circulatory system: Secondary | ICD-10-CM | POA: Diagnosis not present

## 2019-11-16 DIAGNOSIS — I5043 Acute on chronic combined systolic (congestive) and diastolic (congestive) heart failure: Secondary | ICD-10-CM | POA: Diagnosis not present

## 2019-11-16 DIAGNOSIS — I255 Ischemic cardiomyopathy: Secondary | ICD-10-CM | POA: Diagnosis not present

## 2019-11-16 DIAGNOSIS — N183 Chronic kidney disease, stage 3 unspecified: Secondary | ICD-10-CM | POA: Diagnosis not present

## 2019-11-16 DIAGNOSIS — I2510 Atherosclerotic heart disease of native coronary artery without angina pectoris: Secondary | ICD-10-CM | POA: Diagnosis not present

## 2019-11-16 DIAGNOSIS — I130 Hypertensive heart and chronic kidney disease with heart failure and stage 1 through stage 4 chronic kidney disease, or unspecified chronic kidney disease: Secondary | ICD-10-CM | POA: Diagnosis not present

## 2019-11-16 NOTE — Telephone Encounter (Signed)
He has chronic bradycardia.  He needs to come and follow-up with [place] in the office.

## 2019-11-17 DIAGNOSIS — I255 Ischemic cardiomyopathy: Secondary | ICD-10-CM | POA: Diagnosis not present

## 2019-11-17 DIAGNOSIS — I5043 Acute on chronic combined systolic (congestive) and diastolic (congestive) heart failure: Secondary | ICD-10-CM | POA: Diagnosis not present

## 2019-11-17 DIAGNOSIS — Z48812 Encounter for surgical aftercare following surgery on the circulatory system: Secondary | ICD-10-CM | POA: Diagnosis not present

## 2019-11-17 DIAGNOSIS — I130 Hypertensive heart and chronic kidney disease with heart failure and stage 1 through stage 4 chronic kidney disease, or unspecified chronic kidney disease: Secondary | ICD-10-CM | POA: Diagnosis not present

## 2019-11-17 DIAGNOSIS — I2510 Atherosclerotic heart disease of native coronary artery without angina pectoris: Secondary | ICD-10-CM | POA: Diagnosis not present

## 2019-11-17 DIAGNOSIS — N183 Chronic kidney disease, stage 3 unspecified: Secondary | ICD-10-CM | POA: Diagnosis not present

## 2019-11-19 ENCOUNTER — Telehealth: Payer: Self-pay | Admitting: Cardiovascular Disease

## 2019-11-19 DIAGNOSIS — I130 Hypertensive heart and chronic kidney disease with heart failure and stage 1 through stage 4 chronic kidney disease, or unspecified chronic kidney disease: Secondary | ICD-10-CM | POA: Diagnosis not present

## 2019-11-19 DIAGNOSIS — I255 Ischemic cardiomyopathy: Secondary | ICD-10-CM | POA: Diagnosis not present

## 2019-11-19 DIAGNOSIS — I5043 Acute on chronic combined systolic (congestive) and diastolic (congestive) heart failure: Secondary | ICD-10-CM | POA: Diagnosis not present

## 2019-11-19 DIAGNOSIS — N183 Chronic kidney disease, stage 3 unspecified: Secondary | ICD-10-CM | POA: Diagnosis not present

## 2019-11-19 DIAGNOSIS — Z48812 Encounter for surgical aftercare following surgery on the circulatory system: Secondary | ICD-10-CM | POA: Diagnosis not present

## 2019-11-19 DIAGNOSIS — I2510 Atherosclerotic heart disease of native coronary artery without angina pectoris: Secondary | ICD-10-CM | POA: Diagnosis not present

## 2019-11-19 NOTE — Telephone Encounter (Signed)
Noted  

## 2019-11-19 NOTE — Telephone Encounter (Signed)
STAT if HR is under 50 or over 120 (normal HR is 60-100 beats per minute)  1) What is your heart rate? Resting 43  2) Do you have a log of your heart rate readings (document readings)?   3) Do you have any other symptoms? Not really, BP is 171/88, patient in no distress, communicating with nurse   Transferred to [facility]

## 2019-11-19 NOTE — Telephone Encounter (Signed)
That is fine as long as he is asymptomatic.  He has known history of bradycardia and frequent PVCs which might be affecting the heart rate reading.  He has an appointment next week for an echo and follow-up with the TAVR clinic which he should keep.

## 2019-11-19 NOTE — Telephone Encounter (Signed)
Patient of Dr. Fletcher Anon S/P TAVR and [facility] nurse calling in to report HR of 43 and BP of 171/88. States patient is in no distress at this time. Advised that I would send this to provider for review and recommendations. She verbalized understanding with no further concerns.

## 2019-11-19 NOTE — Telephone Encounter (Signed)
Done 5/20 at 11:20 w/ Fletcher Anon

## 2019-11-19 NOTE — Telephone Encounter (Signed)
Clinton [place], MD   He should come and see [place] in 1 to 2 months.

## 2019-11-20 ENCOUNTER — Ambulatory Visit
Admission: RE | Admit: 2019-11-20 | Discharge: 2019-11-20 | Disposition: A | Discharge status: Home / Self Care | Payer: Medicare Other | Source: Ambulatory Visit | Attending: Diagnostic Neuroimaging | Admitting: Diagnostic Neuroimaging

## 2019-11-20 DIAGNOSIS — I2510 Atherosclerotic heart disease of native coronary artery without angina pectoris: Secondary | ICD-10-CM | POA: Diagnosis not present

## 2019-11-20 DIAGNOSIS — I5043 Acute on chronic combined systolic (congestive) and diastolic (congestive) heart failure: Secondary | ICD-10-CM | POA: Diagnosis not present

## 2019-11-20 DIAGNOSIS — I130 Hypertensive heart and chronic kidney disease with heart failure and stage 1 through stage 4 chronic kidney disease, or unspecified chronic kidney disease: Secondary | ICD-10-CM | POA: Diagnosis not present

## 2019-11-20 DIAGNOSIS — I63431 Cerebral infarction due to embolism of right posterior cerebral artery: Secondary | ICD-10-CM | POA: Diagnosis not present

## 2019-11-20 DIAGNOSIS — I255 Ischemic cardiomyopathy: Secondary | ICD-10-CM | POA: Diagnosis not present

## 2019-11-20 DIAGNOSIS — N183 Chronic kidney disease, stage 3 unspecified: Secondary | ICD-10-CM | POA: Diagnosis not present

## 2019-11-20 DIAGNOSIS — Z48812 Encounter for surgical aftercare following surgery on the circulatory system: Secondary | ICD-10-CM | POA: Diagnosis not present

## 2019-11-20 NOTE — Telephone Encounter (Signed)
Noted  

## 2019-11-23 ENCOUNTER — Telehealth: Payer: Self-pay | Admitting: Physician Assistant

## 2019-11-23 DIAGNOSIS — I5043 Acute on chronic combined systolic (congestive) and diastolic (congestive) heart failure: Secondary | ICD-10-CM | POA: Diagnosis not present

## 2019-11-23 DIAGNOSIS — Z48812 Encounter for surgical aftercare following surgery on the circulatory system: Secondary | ICD-10-CM | POA: Diagnosis not present

## 2019-11-23 DIAGNOSIS — I255 Ischemic cardiomyopathy: Secondary | ICD-10-CM | POA: Diagnosis not present

## 2019-11-23 DIAGNOSIS — I130 Hypertensive heart and chronic kidney disease with heart failure and stage 1 through stage 4 chronic kidney disease, or unspecified chronic kidney disease: Secondary | ICD-10-CM | POA: Diagnosis not present

## 2019-11-23 DIAGNOSIS — I2510 Atherosclerotic heart disease of native coronary artery without angina pectoris: Secondary | ICD-10-CM | POA: Diagnosis not present

## 2019-11-23 DIAGNOSIS — N183 Chronic kidney disease, stage 3 unspecified: Secondary | ICD-10-CM | POA: Diagnosis not present

## 2019-11-23 NOTE — Telephone Encounter (Signed)
Patient's daughter [name] calling to request she come with the patient to his appointment 11/26/2019, because he is in a wheelchair.

## 2019-11-23 NOTE — Telephone Encounter (Signed)
Informed Debbie she is allowed for visit. Note put on appointment.

## 2019-11-24 DIAGNOSIS — I255 Ischemic cardiomyopathy: Secondary | ICD-10-CM | POA: Diagnosis not present

## 2019-11-24 DIAGNOSIS — I2510 Atherosclerotic heart disease of native coronary artery without angina pectoris: Secondary | ICD-10-CM | POA: Diagnosis not present

## 2019-11-24 DIAGNOSIS — Z48812 Encounter for surgical aftercare following surgery on the circulatory system: Secondary | ICD-10-CM | POA: Diagnosis not present

## 2019-11-24 DIAGNOSIS — I130 Hypertensive heart and chronic kidney disease with heart failure and stage 1 through stage 4 chronic kidney disease, or unspecified chronic kidney disease: Secondary | ICD-10-CM | POA: Diagnosis not present

## 2019-11-24 DIAGNOSIS — N183 Chronic kidney disease, stage 3 unspecified: Secondary | ICD-10-CM | POA: Diagnosis not present

## 2019-11-24 DIAGNOSIS — I5043 Acute on chronic combined systolic (congestive) and diastolic (congestive) heart failure: Secondary | ICD-10-CM | POA: Diagnosis not present

## 2019-11-25 DIAGNOSIS — I2510 Atherosclerotic heart disease of native coronary artery without angina pectoris: Secondary | ICD-10-CM | POA: Diagnosis not present

## 2019-11-25 DIAGNOSIS — I5043 Acute on chronic combined systolic (congestive) and diastolic (congestive) heart failure: Secondary | ICD-10-CM | POA: Diagnosis not present

## 2019-11-25 DIAGNOSIS — I130 Hypertensive heart and chronic kidney disease with heart failure and stage 1 through stage 4 chronic kidney disease, or unspecified chronic kidney disease: Secondary | ICD-10-CM | POA: Diagnosis not present

## 2019-11-25 DIAGNOSIS — Z48812 Encounter for surgical aftercare following surgery on the circulatory system: Secondary | ICD-10-CM | POA: Diagnosis not present

## 2019-11-25 DIAGNOSIS — N183 Chronic kidney disease, stage 3 unspecified: Secondary | ICD-10-CM | POA: Diagnosis not present

## 2019-11-25 DIAGNOSIS — I255 Ischemic cardiomyopathy: Secondary | ICD-10-CM | POA: Diagnosis not present

## 2019-11-25 NOTE — Progress Notes (Signed)
HEART AND [place]                                       Cardiology Office Note    Date:  11/27/2019   ID:  [name], DOB [birth year], MRN [id number]  PCP:  Leonides Sake, MD  Cardiologist:  Dr. Fletcher Anon (wants to switch since he lives closer to [facility]) / Dr. Angelena Form & Dr. Cyndia Bent (TAVR)  CC: 1 month s/p TAVR  History of Present Illness:  AJAX STASH is a 86 y.o. male with a history of acoustic neuroma with chronic balance issues, non obst CAD, hx of ocular stroke, PAF on amio & Eliquis (s/p DCCV in 08/2019), CKD stage III, HTN, PVCs, ischemic cardiomyopathyand severe LFLG aortic stenosis s/p TAVR (10/27/19) who presents to clinic for follow up.  He was admitted to [facility] in January 2021 with atrial fibrillation with rapid ventricular response as well as nausea and vomiting. An echocardiogram showed an ejection fraction of 25 to 30%. The aortic valve was calcified with restricted mobility.The mean gradient across the valve was 22 mmHg with a peak gradient of 41 mmHg.Aortic valve area was 0.65 cm with a dimensionless index of 0.14. This was felt to represent low flow, low gradient, severe aortic stenosis. He underwent TEE guided cardioversion and was started on Eliquis. He subsequently underwent cardiac catheterization on 09/23/2019 showing mild to moderate nonobstructive coronary disease in the proximal and mid LAD as well as the ramus branch.Right heart cath showed normal pulmonary artery pressures with a mean wedge pressure of 11 mmHg. CVP was normal. Cardiac index was 2.47.  The patient was evaluated by the multidisciplinary valve team and underwent successful TAVR with a 29 mm Edwards Sapien 3 THV via the left subclavian approach (due to aortoiliac disease) on 10/27/19. Post operative echo showed EF.60-65%, normally functioning TAVR with a mean gradient of 16 mm Hg and no PVL. Hospital course was c/b aifb with RVR and new  LBBB. He spontaneously converted prior to DC. A zio patch was placed to rule out late HAVB. He was resumed on his home Eliquis and aspirin at discharge.   At follow up pt complained of blurriness and visual hallucinations. MRI brain showed evidence of embolic strokes likely related to his TAVR or post op afib off anticoagulation. Given small hemorrhages we asked him to hold his Eliquis. He saw Dr. Leta Baptist with neurology who got a follow up CT which showed expected evolutionary changes in the right posterior temporal, occipital and cerebellar  subacute infarcts with resolution of previously described hemorrhagic transformation on MRI. He has not been started back on his Eliquis.   Today he presents to clinic for follow up. Here with daughter. Doing much better in terms of breathing. Still having some mild visual disturbance but can read some. No CP or SOB. No LE edema, orthopnea or PND. No dizziness or syncope. No blood in stool or urine. No palpitations.    Past Medical History:  Diagnosis Date  . AAA (abdominal aortic aneurysm) ([place])   . CAD (coronary artery disease)    L&RHC 12/23/2014 elevated LVEDP, prox LAD 45%, 60% ramus  . CKD (chronic kidney disease), stage III   . Essential hypertension   . Ischemic cardiomyopathy   . OA (osteoarthritis)    a. 2011 s/p bilat TKA.  . Pneumonia    "  about 3 years ago"  . S/P TAVR (transcatheter aortic valve replacement) 10/27/2019   s/p TAVR with a 29 mm Edwards Sapien THV via the subclavian approach by Dr. Sanda Linger & Dr. Cyndia Bent  . Severe aortic stenosis   . Stroke [facility])    in left eye  . Ventricular bigeminy    a. 12/2014. Started on amio on 12/24/2014    Past Surgical History:  Procedure Laterality Date  . CARDIAC CATHETERIZATION N/A 12/23/2014   Procedure: Right/Left Heart Cath and Coronary Angiography;  Surgeon: Belva Crome, MD;  Location: [place] CV LAB;  Service: Cardiovascular;  Laterality: N/A;  . CARDIAC VALVE REPLACEMENT   10/27/2019  . CARDIOVERSION N/A 08/19/2019   Procedure: CARDIOVERSION;  Surgeon: Minna Merritts, MD;  Location: [facility] ORS;  Service: Cardiovascular;  Laterality: N/A;  . KNEE SURGERY    . RIGHT/LEFT HEART CATH AND CORONARY ANGIOGRAPHY N/A 09/23/2019   Procedure: RIGHT/LEFT HEART CATH AND CORONARY ANGIOGRAPHY;  Surgeon: Burnell Blanks, MD;  Location: [place] [facility];  Service: Cardiovascular;  Laterality: N/A;  . SHOULDER SURGERY Left   . TEE WITHOUT CARDIOVERSION N/A 08/19/2019   Procedure: TRANSESOPHAGEAL ECHOCARDIOGRAM (TEE);  Surgeon: Minna Merritts, MD;  Location: [facility] ORS;  Service: Cardiovascular;  Laterality: N/A;  . TEE WITHOUT CARDIOVERSION N/A 10/27/2019   Procedure: TRANSESOPHAGEAL ECHOCARDIOGRAM (TEE);  Surgeon: Burnell Blanks, MD;  Location: [place];  Service: Open Heart Surgery;  Laterality: N/A;  . TONSILLECTOMY      Current Medications: Outpatient Medications Prior to Visit  Medication Sig Dispense Refill  . amLODipine (NORVASC) 5 MG tablet Take 1 tablet (5 mg total) by mouth daily. 90 tablet 3  . amoxicillin (AMOXIL) 500 MG capsule Take 2,000 mg by mouth See admin instructions. Take 4 capsules (2000 mg) by mouth 1 hour prior to dental appointment    . furosemide (LASIX) 40 MG tablet Take 1 tablet (40 mg total) by mouth daily. 60 tablet 6  . metoprolol succinate (TOPROL-XL) 50 MG 24 hr tablet Take 1 tablet (50 mg total) by mouth 2 (two) times daily. Take with or immediately following a meal. 60 tablet 6  . Multiple Vitamin (MULTIVITAMIN WITH MINERALS) TABS tablet Take 1 tablet by mouth daily. One-A-Day for Adults 50+    . aspirin EC 81 MG EC tablet Take 1 tablet (81 mg total) by mouth daily.     No facility-administered medications prior to visit.     Allergies:   Patient has no known allergies.   Social History   Socioeconomic History  . Marital status: Divorced    Spouse name: Not on file  . Number of children: 5  . Years of education: Not on  file  . Highest education level: High school graduate  Occupational History  . Occupation: Retired-Farmer  Tobacco Use  . Smoking status: Former Research scientist (life sciences)  . Smokeless tobacco: Never Used  . Tobacco comment: Smoked for a few yrs in the service.  Quit in 1968. Never a heavy smoker.  Substance and Sexual Activity  . Alcohol use: No    Alcohol/week: 0.0 standard drinks  . Drug use: No  . Sexual activity: Not on file  Other Topics Concern  . Not on file  Social History Narrative   11/09/19 lives alone, Lives between [place] and [place] by himself.  Son and daughter live at farm.    He does not routinely exercise.   Social Determinants of Health   Financial Resource Strain:   . Difficulty  of Paying Living Expenses:   Food Insecurity: No Food Insecurity  . Worried About Charity fundraiser in the Last Year: Never true  . Ran Out of Food in the Last Year: Never true  Transportation Needs: No Transportation Needs  . Lack of Transportation (Medical): No  . Lack of Transportation (Non-Medical): No  Physical Activity:   . Days of Exercise per Week:   . Minutes of Exercise per Session:   Stress:   . Feeling of Stress :   Social Connections:   . Frequency of Communication with Friends and Family:   . Frequency of Social Gatherings with Friends and Family:   . Attends Religious Services:   . Active Member of Clubs or Organizations:   . Attends Archivist Meetings:   Marland Kitchen Marital Status:      Family History:  The patient's family history includes CAD in his mother; Congestive Heart Failure in his sister; Heart attack in his father; Other in his brother.     ROS:   Please see the history of present illness.    ROS All other systems reviewed and are negative.   PHYSICAL EXAM:   VS:  BP (!) 148/88   Pulse 83   Ht 6' (1.829 m)   Wt 238 lb 8 oz (108.2 kg)   SpO2 98%   BMI 32.35 kg/m    GEN: Well nourished, well developed, in no acute distress HEENT: normal Neck: no JVD or  masses Cardiac: RRR; no murmurs, rubs, or gallops,no edema  Respiratory:  clear to auscultation bilaterally, normal work of breathing GI: soft, nontender, nondistended, + BS MS: no deformity or atrophy Skin: warm and dry, no rash.  Neuro:  Alert and Oriented x 3, Strength and sensation are intact Psych: euthymic mood, full affect   Wt Readings from Last 3 Encounters:  11/26/19 238 lb 8 oz (108.2 kg)  11/09/19 236 lb (107 kg)  11/04/19 236 lb (107 kg)      Studies/Labs Reviewed:   EKG:  EKG is NOT ordered today.    Recent Labs: 08/15/2019: TSH 0.802 10/23/2019: ALT 31; B Natriuretic Peptide 708.2 10/28/2019: BUN 21; Creatinine, Ser 1.42; Hemoglobin 12.6; Magnesium 2.0; Platelets 138; Potassium 4.0; Sodium 141   Lipid Panel    Component Value Date/Time   CHOL 148 07/24/2018 1531   TRIG 41 07/24/2018 1531   HDL 37 (L) 07/24/2018 1531   CHOLHDL 4.0 07/24/2018 1531   VLDL 8 07/24/2018 1531   LDLCALC 103 (H) 07/24/2018 1531    Additional studies/ records that were reviewed today include:  TAVR OPERATIVE NOTE   Date of Procedure:10/27/2019  Preoperative Diagnosis:Severe Aortic Stenosis   Postoperative Diagnosis:Same   Procedure:   Transcatheter Aortic Valve Replacement -Left subclavian artery approach Edwards Sapien 3 THV (size 54mm, model # 9600TFX, serial [id number])  Co-Surgeons:Bryan Alveria Apley, MD and Lauree Chandler, MD   Anesthesiologist:S. Gifford Shave, MD  Anne Hahn, MD  Pre-operative Echo Findings: ? Severe aortic stenosis ? Normalleft ventricular systolic function  Post-operative Echo Findings: ? Mildparavalvular leak ? Normalleft ventricular systolic function  _____________   Echo 10/28/19: IMPRESSIONS  1. Day 1 post TAVR, normal transaortic gradients with peak/mean gradients  26/16 mmHg. Only trivial  paravalvular leak.  2. Left ventricular ejection fraction, by estimation, is 60 to 65%. The  left ventricle has normal function. The left ventricle has no regional  wall motion abnormalities. There is mild concentric left ventricular  hypertrophy. Left ventricular diastolic  parameters were normal. Elevated  left atrial pressure.  3. Right ventricular systolic function is normal. The right ventricular  size is normal. There is moderately elevated pulmonary artery systolic  pressure. The estimated right ventricular systolic pressure is 123456 mmHg.  4. Left atrial size was mildly dilated.  5. The mitral valve is normal in structure. No evidence of mitral valve  regurgitation. No evidence of mitral stenosis.  6. Tricuspid valve regurgitation is moderate.  7. The aortic valve has been repaired/replaced. Aortic valve  regurgitation is not visualized. No aortic stenosis is present. There is a  29 mm Edwards Sapien prosthetic (TAVR) valve present in the aortic  position. Procedure Date: 10/27/2019. Aortic valve  mean gradient measures 16.0 mmHg.  8. Aortic dilatation noted. There is mild dilatation of the ascending  aorta.  9. The inferior vena cava is normal in size with greater than 50%  respiratory variability, suggesting right atrial pressure of 3 mmHg.   ________________   Echo 11/26/19 IMPRESSIONS  1. Left ventricular ejection fraction, by estimation, is 55 to 60%. The  left ventricle has normal function. The left ventricle has no regional  wall motion abnormalities. There is mild left ventricular hypertrophy.  Left ventricular diastolic parameters  are consistent with Grade I diastolic dysfunction (impaired relaxation).  2. Right ventricular systolic function is mildly reduced. The right  ventricular size is normal. There is moderately elevated pulmonary artery  systolic pressure. The estimated right ventricular systolic pressure is  123456 mmHg.  3. Left atrial size was  mildly dilated.  4. The mitral valve is normal in structure. Mild mitral valve  regurgitation. No evidence of mitral stenosis.  5. Tricuspid valve regurgitation is mild to moderate.  6. Trivial posterior paravalvular regurgitation.. The aortic valve has  been repaired/replaced. Aortic valve regurgitation is not visualized.  There is a 29 mm Edwards Sapien prosthetic (TAVR) valve present in the  aortic position. Procedure Date:  10/27/2019. Aortic valve area, by VTI measures 1.97 cm. Aortic valve mean  gradient measures 15.0 mmHg.  7. Aortic dilatation noted. There is mild dilatation of the ascending  aorta measuring 42 mm.  8. The inferior vena cava is normal in size with greater than 50%  respiratory variability, suggesting right atrial pressure of 3 mmHg.   Comparison(s): A prior study was performed on 10/28/2019. No significant  change from prior study.   ASSESSMENT & PLAN:   LFLG severe AS s/p TAVR: doing well with NYHA class I symptoms, but not very active. Can now get to the bathroom without "huffing and puffing".  Echo today shows EF 55%, normally functioniung TAVR with a mean gradient of 15 mmHg and trivial PVL. Plan to resume Eliquis now. I will stop aspirin at this time given his advanced age and high risk for bleed.   Post op CVA: CT showed resolved hemorrhage. Will restart Eliquis 2.5mg  BID. Doing better in terms of his visual impairment and hallucinations. He said he can read now but it takes him longer.   New LBBB: he wore a Zio patch for 1 week which did not show any HAVB.  HTN: BP with moderate control. He reports his BP being labile at home. No changes made today  PAF: sounds regular on exam. Resume Eliiquis 2.5mg  BID now.   Chronic combined S/D CHF: appears euvolemic. Continue on lasix 40mg  daily.   AAA: pre TAVR CT showed a stable 3.4 cm infrarenal abdominal aortic aneurysm. Recommend follow-up aortic ultrasound in 3 years. Likely will not cause any issues  given  advanced age. This can be followed over time.   Medication Adjustments/Labs and Tests Ordered: Current medicines are reviewed at length with the patient today.  Concerns regarding medicines are outlined above.  Medication changes, Labs and Tests ordered today are listed in the Patient Instructions below. Patient Instructions  Medication Instructions:  Your physician has recommended you make the following change in your medication:  1.) stop aspirin 2.) restart apixiban (Eliquis) 2.5 mg twice a day  *If you need a refill on your cardiac medications before your next appointment, please call your pharmacy*   Lab Work: none If you have labs (blood work) drawn today and your tests are completely normal, you will receive your results only by: Marland Kitchen MyChart Message (if you have MyChart) OR . A paper copy in the mail If you have any lab test that is abnormal or we need to change your treatment, we will call you to review the results.   Testing/Procedures: none   Follow-Up: As planned  Other Instructions      Signed, Angelena Form, PA-C  11/27/2019 10:43 AM    [facility] [address], [place], [place]  [address] Phone: [phone]; Fax: [phone]

## 2019-11-26 ENCOUNTER — Encounter: Payer: Self-pay | Admitting: Physician Assistant

## 2019-11-26 ENCOUNTER — Other Ambulatory Visit: Payer: Self-pay

## 2019-11-26 ENCOUNTER — Ambulatory Visit (INDEPENDENT_AMBULATORY_CARE_PROVIDER_SITE_OTHER): Payer: Medicare Other | Admitting: Physician Assistant

## 2019-11-26 ENCOUNTER — Ambulatory Visit (HOSPITAL_COMMUNITY): Payer: Medicare Other | Attending: Internal Medicine

## 2019-11-26 VITALS — BP 148/88 | HR 83 | Ht 72.0 in | Wt 238.5 lb

## 2019-11-26 DIAGNOSIS — I2510 Atherosclerotic heart disease of native coronary artery without angina pectoris: Secondary | ICD-10-CM

## 2019-11-26 DIAGNOSIS — Z952 Presence of prosthetic heart valve: Secondary | ICD-10-CM

## 2019-11-26 DIAGNOSIS — I480 Paroxysmal atrial fibrillation: Secondary | ICD-10-CM

## 2019-11-26 DIAGNOSIS — I714 Abdominal aortic aneurysm, without rupture, unspecified: Secondary | ICD-10-CM

## 2019-11-26 DIAGNOSIS — I447 Left bundle-branch block, unspecified: Secondary | ICD-10-CM | POA: Diagnosis not present

## 2019-11-26 DIAGNOSIS — I5042 Chronic combined systolic (congestive) and diastolic (congestive) heart failure: Secondary | ICD-10-CM

## 2019-11-26 DIAGNOSIS — I639 Cerebral infarction, unspecified: Secondary | ICD-10-CM | POA: Diagnosis not present

## 2019-11-26 DIAGNOSIS — I10 Essential (primary) hypertension: Secondary | ICD-10-CM

## 2019-11-26 MED ORDER — APIXABAN 2.5 MG PO TABS: 2.5000 mg | ORAL_TABLET | Freq: Two times a day (BID) | ORAL | 6 refills | Status: AC

## 2019-11-26 NOTE — Patient Instructions (Signed)
Medication Instructions:  Your physician has recommended you make the following change in your medication:  1.) stop aspirin 2.) restart apixiban (Eliquis) 2.5 mg twice a day  *If you need a refill on your cardiac medications before your next appointment, please call your pharmacy*   Lab Work: none If you have labs (blood work) drawn today and your tests are completely normal, you will receive your results only by: Marland Kitchen MyChart Message (if you have MyChart) OR . A paper copy in the mail If you have any lab test that is abnormal or we need to change your treatment, we will call you to review the results.   Testing/Procedures: none   Follow-Up: As planned  Other Instructions

## 2019-11-27 DIAGNOSIS — I255 Ischemic cardiomyopathy: Secondary | ICD-10-CM | POA: Diagnosis not present

## 2019-11-27 DIAGNOSIS — I130 Hypertensive heart and chronic kidney disease with heart failure and stage 1 through stage 4 chronic kidney disease, or unspecified chronic kidney disease: Secondary | ICD-10-CM | POA: Diagnosis not present

## 2019-11-27 DIAGNOSIS — Z48812 Encounter for surgical aftercare following surgery on the circulatory system: Secondary | ICD-10-CM | POA: Diagnosis not present

## 2019-11-27 DIAGNOSIS — N183 Chronic kidney disease, stage 3 unspecified: Secondary | ICD-10-CM | POA: Diagnosis not present

## 2019-11-27 DIAGNOSIS — I2510 Atherosclerotic heart disease of native coronary artery without angina pectoris: Secondary | ICD-10-CM | POA: Diagnosis not present

## 2019-11-27 DIAGNOSIS — I5043 Acute on chronic combined systolic (congestive) and diastolic (congestive) heart failure: Secondary | ICD-10-CM | POA: Diagnosis not present

## 2019-11-30 DIAGNOSIS — I255 Ischemic cardiomyopathy: Secondary | ICD-10-CM | POA: Diagnosis not present

## 2019-11-30 DIAGNOSIS — I5043 Acute on chronic combined systolic (congestive) and diastolic (congestive) heart failure: Secondary | ICD-10-CM | POA: Diagnosis not present

## 2019-11-30 DIAGNOSIS — Z48812 Encounter for surgical aftercare following surgery on the circulatory system: Secondary | ICD-10-CM | POA: Diagnosis not present

## 2019-11-30 DIAGNOSIS — N183 Chronic kidney disease, stage 3 unspecified: Secondary | ICD-10-CM | POA: Diagnosis not present

## 2019-11-30 DIAGNOSIS — I2510 Atherosclerotic heart disease of native coronary artery without angina pectoris: Secondary | ICD-10-CM | POA: Diagnosis not present

## 2019-11-30 DIAGNOSIS — I130 Hypertensive heart and chronic kidney disease with heart failure and stage 1 through stage 4 chronic kidney disease, or unspecified chronic kidney disease: Secondary | ICD-10-CM | POA: Diagnosis not present

## 2019-12-02 ENCOUNTER — Ambulatory Visit: Payer: Self-pay | Admitting: *Deleted

## 2019-12-03 ENCOUNTER — Other Ambulatory Visit: Payer: Self-pay | Admitting: *Deleted

## 2019-12-03 DIAGNOSIS — N183 Chronic kidney disease, stage 3 unspecified: Secondary | ICD-10-CM | POA: Diagnosis not present

## 2019-12-03 DIAGNOSIS — I2510 Atherosclerotic heart disease of native coronary artery without angina pectoris: Secondary | ICD-10-CM | POA: Diagnosis not present

## 2019-12-03 DIAGNOSIS — Z952 Presence of prosthetic heart valve: Secondary | ICD-10-CM | POA: Diagnosis not present

## 2019-12-03 DIAGNOSIS — I493 Ventricular premature depolarization: Secondary | ICD-10-CM | POA: Diagnosis not present

## 2019-12-03 DIAGNOSIS — I714 Abdominal aortic aneurysm, without rupture: Secondary | ICD-10-CM | POA: Diagnosis not present

## 2019-12-03 DIAGNOSIS — Z48812 Encounter for surgical aftercare following surgery on the circulatory system: Secondary | ICD-10-CM | POA: Diagnosis not present

## 2019-12-03 DIAGNOSIS — I5043 Acute on chronic combined systolic (congestive) and diastolic (congestive) heart failure: Secondary | ICD-10-CM | POA: Diagnosis not present

## 2019-12-03 DIAGNOSIS — I4819 Other persistent atrial fibrillation: Secondary | ICD-10-CM | POA: Diagnosis not present

## 2019-12-03 DIAGNOSIS — I255 Ischemic cardiomyopathy: Secondary | ICD-10-CM | POA: Diagnosis not present

## 2019-12-03 DIAGNOSIS — I130 Hypertensive heart and chronic kidney disease with heart failure and stage 1 through stage 4 chronic kidney disease, or unspecified chronic kidney disease: Secondary | ICD-10-CM | POA: Diagnosis not present

## 2019-12-03 DIAGNOSIS — Z7982 Long term (current) use of aspirin: Secondary | ICD-10-CM | POA: Diagnosis not present

## 2019-12-03 NOTE — Patient Outreach (Signed)
[facility]) Care Management  12/03/2019  OLUWASEUN GUADRON 1928/03/19 [id number]   Subjective: Telephone call to patient's daughter / designated party release  [name] [place])  mobile number, spoke with daughter, stated patient's name, date of birth, and address.   States she remembers speaking with this RNCM in the past.  States patient is doing well, participating in home health physical therapy, increased endurance with ambulation, increased overall strengthening, stroke has effected eye sight, MD aware, and continue to monitor sight.  States MD will refer patient  to opthalmologist if needed in the future and vision is not improving over time.  States patient is having some periodic hallucinations, seeing little children, seeing dogs, patient is aware it is not real, MD is aware, no medications changes, and will continue to monitor.  Daughter states she is aware of signs/ symptoms to report, how to reach provider if needed after hours, when to go to ED, and / or call 911.  States patient has a cardiologist appointment on 11/26/2019, appointment went well,  had CT Scan on 11/20/2019, and scan confirmed patient had stroke during hospitalization after procedure.  Daughter  states patient does not have any education material, transition of care, care coordination, disease management, disease monitoring, transportation, community resource, or pharmacy needs at this time.  States she is very appreciative of the follow up and is in agreement to follow up with [facility] if assistance needed in the future.     Objective:Per KPN (Knowledge Performance Now, point of care tool) and chart review,patient hospitalized 10/27/2019 - 10/28/2019 for Severe Aortic Stenosisstatus postTAVR (transcatheter aortic valve replacement). Patient also has a history of CKD (chronic kidney disease), stage III,acoustic neuroma,Gait instability,Acute on chronic systolic heart failure,PVC's (premature  ventricular contractions,Persistent atrial fibrillation,Ischemic cardiomyopathy,AAA (abdominal aortic aneurysm), CAD,New LBBB, and hypertension.    Assessment: Received Medicare [employer] [facility] Liaison follow up referral on 10/28/2019. Referral source: Natividad Brood. Referral reason: Please assign to [place] Coordinator for complex care and disease management follow up calls and assess for return to [facility] when appropriate for further needs. Reason for consultstatus postscheduled TAVR follow up, was active with [place].Diagnoses of Other   Other Diagnosis: TAVR follow up.Screening follow up completed and no care management needs.     Plan: RNCM will complete case closure due to follow up completed / no care management needs.  RNCM will send MD case closure letter.       Trevion Hoben H. Annia Friendly, BSN, [place] [facility] Telephonic CM Phone: [phone] Fax: [phone]

## 2019-12-31 ENCOUNTER — Ambulatory Visit (INDEPENDENT_AMBULATORY_CARE_PROVIDER_SITE_OTHER): Payer: Medicare Other | Admitting: Cardiovascular Disease

## 2019-12-31 ENCOUNTER — Other Ambulatory Visit: Payer: Self-pay

## 2019-12-31 ENCOUNTER — Encounter: Payer: Self-pay | Admitting: Cardiovascular Disease

## 2019-12-31 VITALS — BP 110/60 | HR 81 | Ht 72.0 in | Wt 238.0 lb

## 2019-12-31 DIAGNOSIS — I4819 Other persistent atrial fibrillation: Secondary | ICD-10-CM

## 2019-12-31 DIAGNOSIS — Z952 Presence of prosthetic heart valve: Secondary | ICD-10-CM

## 2019-12-31 DIAGNOSIS — I2510 Atherosclerotic heart disease of native coronary artery without angina pectoris: Secondary | ICD-10-CM

## 2019-12-31 DIAGNOSIS — I10 Essential (primary) hypertension: Secondary | ICD-10-CM

## 2019-12-31 NOTE — Progress Notes (Signed)
Cardiology Office Note   Date:  12/31/2019   ID:  Clinton Holmes, DOB 06/28/1928, MRN [id number]  PCP:  Leonides Sake, MD  Cardiologist:   Kathlyn Sacramento, MD   Chief Complaint  Patient presents with  . OTHER    1 month f/u no complaints today. Meds reviewed verbally with pt.      History of Present Illness: Clinton Holmes is a 86 y.o. male who presents for a follow-up visit regarding aortic valve disease status post recent TAVR and paroxysmal atrial fibrillation on amiodarone and anticoagulation with Eliquis. He has chronic medical conditions including a caustic neuroma, nonobstructive coronary artery disease, previous stroke, stage III chronic kidney disease, essential hypertension, PVCs, nonischemic cardiomyopathy. He was hospitalized at [facility] in January of this year with atrial fibrillation with rapid ventricular response.  Echocardiogram showed an EF of 25 to 30% with severe aortic stenosis.  He underwent TEE guided cardioversion and was started on Eliquis at that time.  He underwent right and left cardiac catheterization February which showed mild to moderate nonobstructive coronary artery disease.  Right heart catheterization showed normal filling and pulmonary pressures.  He underwent successful TAVR via the left subclavian approach in March of this year.  Postoperative echo showed improvement in ejection fraction to 60 to 65% with normal functioning TAVR with mean gradient of 16 mmHg.  Hospital course was complicated by A. fib with RVR with new left bundle branch block.  He converted to sinus rhythm prior to discharge.  The patient had subsequent blurred vision and visual hallucinations.  MRI of the brain showed evidence of embolic strokes likely related to his TAVR procedure or postop atrial fibrillation.  His Eliquis was interrupted initially due to concerns about risk of hemorrhagic transformation but was subsequently resumed. He has been doing reasonably well with no chest pain  or palpitations.  He describes mild exertional dyspnea.  He is noted to be in atrial fibrillation today.  He was in sinus rhythm and most recent visit in March.  His biggest issue continues to be deteriorating vision with visual hallucinations.   Past Medical History:  Diagnosis Date  . AAA (abdominal aortic aneurysm) ([place])   . CAD (coronary artery disease)    L&RHC 12/23/2014 elevated LVEDP, prox LAD 45%, 60% ramus  . CKD (chronic kidney disease), stage III   . Essential hypertension   . Ischemic cardiomyopathy   . OA (osteoarthritis)    a. 2011 s/p bilat TKA.  . Pneumonia    "about 3 years ago"  . S/P TAVR (transcatheter aortic valve replacement) 10/27/2019   s/p TAVR with a 29 mm Edwards Sapien THV via the subclavian approach by Dr. Sanda Linger & Dr. Cyndia Bent  . Severe aortic stenosis   . Stroke [facility])    in left eye  . Ventricular bigeminy    a. 12/2014. Started on amio on 12/24/2014    Past Surgical History:  Procedure Laterality Date  . CARDIAC CATHETERIZATION N/A 12/23/2014   Procedure: Right/Left Heart Cath and Coronary Angiography;  Surgeon: Belva Crome, MD;  Location: [place] [facility];  Service: Cardiovascular;  Laterality: N/A;  . CARDIAC VALVE REPLACEMENT  10/27/2019  . CARDIOVERSION N/A 08/19/2019   Procedure: CARDIOVERSION;  Surgeon: Minna Merritts, MD;  Location: [facility] ORS;  Service: Cardiovascular;  Laterality: N/A;  . KNEE SURGERY    . RIGHT/LEFT HEART CATH AND CORONARY ANGIOGRAPHY N/A 09/23/2019   Procedure: RIGHT/LEFT HEART CATH AND CORONARY ANGIOGRAPHY;  Surgeon: Lauree Chandler  D, MD;  Location: [place] [facility];  Service: Cardiovascular;  Laterality: N/A;  . SHOULDER SURGERY Left   . TEE WITHOUT CARDIOVERSION N/A 08/19/2019   Procedure: TRANSESOPHAGEAL ECHOCARDIOGRAM (TEE);  Surgeon: Minna Merritts, MD;  Location: [facility] OR[facility];  Service: Cardiovascular;  Laterality: N/A;  . TEE WITHOUT CARDIOVERSION N/A 10/27/2019   Procedure: TRANSESOPHAGEAL ECHOCARDIOGRAM  (TEE);  Surgeon: Burnell Blanks, MD;  Location: [place];  Service: Open Heart Surgery;  Laterality: N/A;  . TONSILLECTOMY       Current Outpatient Medications  Medication Sig Dispense Refill  . amLODipine (NORVASC) 5 MG tablet Take 1 tablet (5 mg total) by mouth daily. 90 tablet 3  . amoxicillin (AMOXIL) 500 MG capsule Take 2,000 mg by mouth See admin instructions. Take 4 capsules (2000 mg) by mouth 1 hour prior to dental appointment    . apixaban (ELIQUIS) 2.5 MG TABS tablet Take 1 tablet (2.5 mg total) by mouth 2 (two) times daily. 60 tablet 6  . furosemide (LASIX) 40 MG tablet Take 1 tablet (40 mg total) by mouth daily. 60 tablet 6  . metoprolol succinate (TOPROL-XL) 50 MG 24 hr tablet Take 1 tablet (50 mg total) by mouth 2 (two) times daily. Take with or immediately following a meal. 60 tablet 6  . Multiple Vitamin (MULTIVITAMIN WITH MINERALS) TABS tablet Take 1 tablet by mouth daily. One-A-Day for Adults 50+     No current facility-administered medications for this visit.    Allergies:   Patient has no known allergies.    Social History:  The patient  reports that he has quit smoking. He has never used smokeless tobacco. He reports that he does not drink alcohol or use drugs.   Family History:  The patient's family history includes CAD in his mother; Congestive Heart Failure in his sister; Heart attack in his father; Other in his brother.    ROS:  Please see the history of present illness.   Otherwise, review of systems are positive for none.   All other systems are reviewed and negative.    PHYSICAL EXAM: VS:  BP 110/60 (BP Location: Left Arm, Patient Position: Sitting, Cuff Size: Large)   Pulse 81   Ht 6' (1.829 m)   Wt 238 lb (108 kg)   SpO2 98%   BMI 32.28 kg/m  , BMI Body mass index is 32.28 kg/m. GEN: Well nourished, well developed, in no acute distress  HEENT: normal  Neck: no JVD, carotid bruits, or masses Cardiac: Irregularly irregular; no murmurs, rubs,  or gallops, trace bilateral leg edema edema  Respiratory:  clear to auscultation bilaterally, normal work of breathing GI: soft, nontender, nondistended, + BS MS: no deformity or atrophy  Skin: warm and dry, no rash Neuro:  Strength and sensation are intact Psych: euthymic mood, full affect   EKG:  EKG is ordered today. The ekg ordered today demonstrates atrial fibrillation with left bundle branch block.  Ventricular rate is 81 bpm.   Recent Labs: 08/15/2019: TSH 0.802 10/23/2019: ALT 31; B Natriuretic Peptide 708.2 10/28/2019: BUN 21; Creatinine, Ser 1.42; Hemoglobin 12.6; Magnesium 2.0; Platelets 138; Potassium 4.0; Sodium 141    Lipid Panel    Component Value Date/Time   CHOL 148 07/24/2018 1531   TRIG 41 07/24/2018 1531   HDL 37 (L) 07/24/2018 1531   CHOLHDL 4.0 07/24/2018 1531   VLDL 8 07/24/2018 1531   LDLCALC 103 (H) 07/24/2018 1531      Wt Readings from Last 3 Encounters:  12/31/19 238 lb (108 kg)  11/26/19 238 lb 8 oz (108.2 kg)  11/09/19 236 lb (107 kg)       No flowsheet data found.    ASSESSMENT AND PLAN:  1.  Severe aortic stenosis status post TAVR in March of this year.  Most recent echocardiogram showed normal functioning TAVR prosthesis.  Currently [place] heart association class II.  2.  Postoperative CVA: Deteriorating vision and visual hallucinations seem to be the biggest issue for him now.  There has been no significant improvement.  3.  New left bundle branch block: Continue to monitor closely.  No indication for pacemaker.  3.  Paroxysmal atrial fibrillation: He is noted to be in atrial fibrillation today but he does not seem to be significantly symptomatic from this.  Thus, I am going to continue rate control for now especially with his recent stroke.  He is on small dose Eliquis 2.5 mg twice daily.  Most recent creatinine was 1.4.  The correct dose for him would be 5 mg twice daily but I am going to wait few months before increasing the dose  given recent stroke.  The plan is to repeat his labs during that visit.    Disposition:   FU with me in 3 months  Signed,  Kathlyn Sacramento, MD  12/31/2019 11:13 AM    [place]

## 2019-12-31 NOTE — Patient Instructions (Signed)
Medication Instructions:  Your physician recommends that you continue on your current medications as directed. Please refer to the Current Medication list given to you today.  *If you need a refill on your cardiac medications before your next appointment, please call your pharmacy*   Lab Work: None ordered If you have labs (blood work) drawn today and your tests are completely normal, you will receive your results only by: Marland Kitchen MyChart Message (if you have MyChart) OR . A paper copy in the mail If you have any lab test that is abnormal or we need to change your treatment, we will call you to review the results.   Testing/Procedures: None ordered   Follow-Up: At [facility], you and your health needs are our priority.  As part of our continuing mission to provide you with exceptional heart care, we have created designated Provider Care Teams.  These Care Teams include your primary Cardiologist (physician) and Advanced Practice Providers (APPs -  Physician Assistants and Nurse Practitioners) who all work together to provide you with the care you need, when you need it.  We recommend signing up for the patient portal called "MyChart".  Sign up information is provided on this After Visit Summary.  MyChart is used to connect with patients for Virtual Visits (Telemedicine).  Patients are able to view lab/test results, encounter notes, upcoming appointments, etc.  Non-urgent messages can be sent to your provider as well.   To learn more about what you can do with MyChart, go to [email or web address].    Your next appointment:   3 month(s)  The format for your next appointment:   In Person  Provider:    You may see Dr. Fletcher Anon or one of the following Advanced Practice Providers on your designated Care Team:    Murray Hodgkins, NP  Christell Faith, PA-C  Marrianne Mood, PA-C    Other Instructions N/A

## 2020-01-02 DIAGNOSIS — Z7901 Long term (current) use of anticoagulants: Secondary | ICD-10-CM | POA: Diagnosis not present

## 2020-01-02 DIAGNOSIS — I255 Ischemic cardiomyopathy: Secondary | ICD-10-CM | POA: Diagnosis not present

## 2020-01-02 DIAGNOSIS — Z9181 History of falling: Secondary | ICD-10-CM | POA: Diagnosis not present

## 2020-01-02 DIAGNOSIS — I2510 Atherosclerotic heart disease of native coronary artery without angina pectoris: Secondary | ICD-10-CM | POA: Diagnosis not present

## 2020-01-02 DIAGNOSIS — I4819 Other persistent atrial fibrillation: Secondary | ICD-10-CM | POA: Diagnosis not present

## 2020-01-02 DIAGNOSIS — I5043 Acute on chronic combined systolic (congestive) and diastolic (congestive) heart failure: Secondary | ICD-10-CM | POA: Diagnosis not present

## 2020-01-02 DIAGNOSIS — N183 Chronic kidney disease, stage 3 unspecified: Secondary | ICD-10-CM | POA: Diagnosis not present

## 2020-01-02 DIAGNOSIS — Z952 Presence of prosthetic heart valve: Secondary | ICD-10-CM | POA: Diagnosis not present

## 2020-01-02 DIAGNOSIS — I130 Hypertensive heart and chronic kidney disease with heart failure and stage 1 through stage 4 chronic kidney disease, or unspecified chronic kidney disease: Secondary | ICD-10-CM | POA: Diagnosis not present

## 2020-01-02 DIAGNOSIS — I714 Abdominal aortic aneurysm, without rupture: Secondary | ICD-10-CM | POA: Diagnosis not present

## 2020-01-02 DIAGNOSIS — I493 Ventricular premature depolarization: Secondary | ICD-10-CM | POA: Diagnosis not present

## 2020-01-05 DIAGNOSIS — N183 Chronic kidney disease, stage 3 unspecified: Secondary | ICD-10-CM | POA: Diagnosis not present

## 2020-01-05 DIAGNOSIS — I5043 Acute on chronic combined systolic (congestive) and diastolic (congestive) heart failure: Secondary | ICD-10-CM | POA: Diagnosis not present

## 2020-01-05 DIAGNOSIS — I255 Ischemic cardiomyopathy: Secondary | ICD-10-CM | POA: Diagnosis not present

## 2020-01-05 DIAGNOSIS — I2510 Atherosclerotic heart disease of native coronary artery without angina pectoris: Secondary | ICD-10-CM | POA: Diagnosis not present

## 2020-01-05 DIAGNOSIS — I130 Hypertensive heart and chronic kidney disease with heart failure and stage 1 through stage 4 chronic kidney disease, or unspecified chronic kidney disease: Secondary | ICD-10-CM | POA: Diagnosis not present

## 2020-01-05 DIAGNOSIS — I4819 Other persistent atrial fibrillation: Secondary | ICD-10-CM | POA: Diagnosis not present

## 2020-01-14 ENCOUNTER — Telehealth: Payer: Self-pay | Admitting: Cardiovascular Disease

## 2020-01-14 DIAGNOSIS — I2510 Atherosclerotic heart disease of native coronary artery without angina pectoris: Secondary | ICD-10-CM | POA: Diagnosis not present

## 2020-01-14 DIAGNOSIS — I130 Hypertensive heart and chronic kidney disease with heart failure and stage 1 through stage 4 chronic kidney disease, or unspecified chronic kidney disease: Secondary | ICD-10-CM | POA: Diagnosis not present

## 2020-01-14 DIAGNOSIS — N183 Chronic kidney disease, stage 3 unspecified: Secondary | ICD-10-CM | POA: Diagnosis not present

## 2020-01-14 DIAGNOSIS — I5043 Acute on chronic combined systolic (congestive) and diastolic (congestive) heart failure: Secondary | ICD-10-CM | POA: Diagnosis not present

## 2020-01-14 DIAGNOSIS — I4819 Other persistent atrial fibrillation: Secondary | ICD-10-CM | POA: Diagnosis not present

## 2020-01-14 DIAGNOSIS — I255 Ischemic cardiomyopathy: Secondary | ICD-10-CM | POA: Diagnosis not present

## 2020-01-14 NOTE — Telephone Encounter (Signed)
Erin with [facility] calling to report Patient had a fall off golf cart about a week ago - only reports a lip blister If any question or concerns ok to call Erin at [phone] or patient

## 2020-01-15 NOTE — Telephone Encounter (Signed)
Returned call to [employer] with home health. She went to see patient yesterday and noticed that he had a blister on lip. Pt reported fall after getting up from golf cart. She reported baseline neuro status. No other signs of bleeding or injury.   VSS 130/78, HR 59, 97%.   Made call to patient who reported he was doing well. He was frustrated that nurse called our office. He felt that he stood too quickly when getting up from golf cart. He is otherwise doing well. No complaints.   I will route to MD as FYI.

## 2020-01-19 DIAGNOSIS — H2513 Age-related nuclear cataract, bilateral: Secondary | ICD-10-CM | POA: Diagnosis not present

## 2020-01-20 DIAGNOSIS — I4819 Other persistent atrial fibrillation: Secondary | ICD-10-CM | POA: Diagnosis not present

## 2020-01-20 DIAGNOSIS — I255 Ischemic cardiomyopathy: Secondary | ICD-10-CM | POA: Diagnosis not present

## 2020-01-20 DIAGNOSIS — N183 Chronic kidney disease, stage 3 unspecified: Secondary | ICD-10-CM | POA: Diagnosis not present

## 2020-01-20 DIAGNOSIS — I130 Hypertensive heart and chronic kidney disease with heart failure and stage 1 through stage 4 chronic kidney disease, or unspecified chronic kidney disease: Secondary | ICD-10-CM | POA: Diagnosis not present

## 2020-01-20 DIAGNOSIS — I5043 Acute on chronic combined systolic (congestive) and diastolic (congestive) heart failure: Secondary | ICD-10-CM | POA: Diagnosis not present

## 2020-01-20 DIAGNOSIS — I2510 Atherosclerotic heart disease of native coronary artery without angina pectoris: Secondary | ICD-10-CM | POA: Diagnosis not present

## 2020-01-26 ENCOUNTER — Telehealth: Payer: Self-pay | Admitting: Cardiovascular Disease

## 2020-01-26 DIAGNOSIS — I2510 Atherosclerotic heart disease of native coronary artery without angina pectoris: Secondary | ICD-10-CM | POA: Diagnosis not present

## 2020-01-26 DIAGNOSIS — I4819 Other persistent atrial fibrillation: Secondary | ICD-10-CM | POA: Diagnosis not present

## 2020-01-26 DIAGNOSIS — N183 Chronic kidney disease, stage 3 unspecified: Secondary | ICD-10-CM | POA: Diagnosis not present

## 2020-01-26 DIAGNOSIS — I255 Ischemic cardiomyopathy: Secondary | ICD-10-CM | POA: Diagnosis not present

## 2020-01-26 DIAGNOSIS — I5043 Acute on chronic combined systolic (congestive) and diastolic (congestive) heart failure: Secondary | ICD-10-CM | POA: Diagnosis not present

## 2020-01-26 DIAGNOSIS — I130 Hypertensive heart and chronic kidney disease with heart failure and stage 1 through stage 4 chronic kidney disease, or unspecified chronic kidney disease: Secondary | ICD-10-CM | POA: Diagnosis not present

## 2020-01-26 NOTE — Telephone Encounter (Signed)
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

## 2020-01-27 NOTE — Telephone Encounter (Signed)
According to the chart the patient has had home care services including PT in past managed by his primary cardiologist.  Will route to Dr. Arida/Dr. Donivan Scull office.

## 2020-02-01 DIAGNOSIS — I130 Hypertensive heart and chronic kidney disease with heart failure and stage 1 through stage 4 chronic kidney disease, or unspecified chronic kidney disease: Secondary | ICD-10-CM | POA: Diagnosis not present

## 2020-02-01 DIAGNOSIS — I4819 Other persistent atrial fibrillation: Secondary | ICD-10-CM | POA: Diagnosis not present

## 2020-02-01 DIAGNOSIS — Z952 Presence of prosthetic heart valve: Secondary | ICD-10-CM | POA: Diagnosis not present

## 2020-02-01 DIAGNOSIS — I2510 Atherosclerotic heart disease of native coronary artery without angina pectoris: Secondary | ICD-10-CM | POA: Diagnosis not present

## 2020-02-01 DIAGNOSIS — Z7901 Long term (current) use of anticoagulants: Secondary | ICD-10-CM | POA: Diagnosis not present

## 2020-02-01 DIAGNOSIS — I255 Ischemic cardiomyopathy: Secondary | ICD-10-CM | POA: Diagnosis not present

## 2020-02-01 DIAGNOSIS — I5043 Acute on chronic combined systolic (congestive) and diastolic (congestive) heart failure: Secondary | ICD-10-CM | POA: Diagnosis not present

## 2020-02-01 DIAGNOSIS — I493 Ventricular premature depolarization: Secondary | ICD-10-CM | POA: Diagnosis not present

## 2020-02-01 DIAGNOSIS — N183 Chronic kidney disease, stage 3 unspecified: Secondary | ICD-10-CM | POA: Diagnosis not present

## 2020-02-01 DIAGNOSIS — I714 Abdominal aortic aneurysm, without rupture: Secondary | ICD-10-CM | POA: Diagnosis not present

## 2020-02-01 DIAGNOSIS — Z9181 History of falling: Secondary | ICD-10-CM | POA: Diagnosis not present

## 2020-02-02 NOTE — Telephone Encounter (Signed)
Left voicemail message to call back. Orders were faxed to office last week and if additional order is needed to please call back.

## 2020-02-03 NOTE — Telephone Encounter (Signed)
Spoke with Santiago Glad and reviewed that PT order renewal would be fine and to fax order to [place] and we can get that signed next week. She verbalized understanding with no further questions at this time.

## 2020-02-04 DIAGNOSIS — I2510 Atherosclerotic heart disease of native coronary artery without angina pectoris: Secondary | ICD-10-CM | POA: Diagnosis not present

## 2020-02-04 DIAGNOSIS — I130 Hypertensive heart and chronic kidney disease with heart failure and stage 1 through stage 4 chronic kidney disease, or unspecified chronic kidney disease: Secondary | ICD-10-CM | POA: Diagnosis not present

## 2020-02-04 DIAGNOSIS — N183 Chronic kidney disease, stage 3 unspecified: Secondary | ICD-10-CM | POA: Diagnosis not present

## 2020-02-04 DIAGNOSIS — I4819 Other persistent atrial fibrillation: Secondary | ICD-10-CM | POA: Diagnosis not present

## 2020-02-04 DIAGNOSIS — I255 Ischemic cardiomyopathy: Secondary | ICD-10-CM | POA: Diagnosis not present

## 2020-02-04 DIAGNOSIS — I5043 Acute on chronic combined systolic (congestive) and diastolic (congestive) heart failure: Secondary | ICD-10-CM | POA: Diagnosis not present

## 2020-02-11 DIAGNOSIS — I255 Ischemic cardiomyopathy: Secondary | ICD-10-CM | POA: Diagnosis not present

## 2020-02-11 DIAGNOSIS — I130 Hypertensive heart and chronic kidney disease with heart failure and stage 1 through stage 4 chronic kidney disease, or unspecified chronic kidney disease: Secondary | ICD-10-CM | POA: Diagnosis not present

## 2020-02-11 DIAGNOSIS — I5043 Acute on chronic combined systolic (congestive) and diastolic (congestive) heart failure: Secondary | ICD-10-CM | POA: Diagnosis not present

## 2020-02-11 DIAGNOSIS — I2510 Atherosclerotic heart disease of native coronary artery without angina pectoris: Secondary | ICD-10-CM | POA: Diagnosis not present

## 2020-02-11 DIAGNOSIS — N183 Chronic kidney disease, stage 3 unspecified: Secondary | ICD-10-CM | POA: Diagnosis not present

## 2020-02-11 DIAGNOSIS — I4819 Other persistent atrial fibrillation: Secondary | ICD-10-CM | POA: Diagnosis not present

## 2020-02-12 DIAGNOSIS — I4819 Other persistent atrial fibrillation: Secondary | ICD-10-CM | POA: Diagnosis not present

## 2020-02-12 DIAGNOSIS — I5043 Acute on chronic combined systolic (congestive) and diastolic (congestive) heart failure: Secondary | ICD-10-CM | POA: Diagnosis not present

## 2020-02-12 DIAGNOSIS — I255 Ischemic cardiomyopathy: Secondary | ICD-10-CM | POA: Diagnosis not present

## 2020-02-12 DIAGNOSIS — I130 Hypertensive heart and chronic kidney disease with heart failure and stage 1 through stage 4 chronic kidney disease, or unspecified chronic kidney disease: Secondary | ICD-10-CM | POA: Diagnosis not present

## 2020-02-12 DIAGNOSIS — N183 Chronic kidney disease, stage 3 unspecified: Secondary | ICD-10-CM | POA: Diagnosis not present

## 2020-02-12 DIAGNOSIS — I2510 Atherosclerotic heart disease of native coronary artery without angina pectoris: Secondary | ICD-10-CM | POA: Diagnosis not present

## 2020-02-16 DIAGNOSIS — I4819 Other persistent atrial fibrillation: Secondary | ICD-10-CM | POA: Diagnosis not present

## 2020-02-16 DIAGNOSIS — I255 Ischemic cardiomyopathy: Secondary | ICD-10-CM | POA: Diagnosis not present

## 2020-02-16 DIAGNOSIS — I130 Hypertensive heart and chronic kidney disease with heart failure and stage 1 through stage 4 chronic kidney disease, or unspecified chronic kidney disease: Secondary | ICD-10-CM | POA: Diagnosis not present

## 2020-02-16 DIAGNOSIS — I2510 Atherosclerotic heart disease of native coronary artery without angina pectoris: Secondary | ICD-10-CM | POA: Diagnosis not present

## 2020-02-16 DIAGNOSIS — I5043 Acute on chronic combined systolic (congestive) and diastolic (congestive) heart failure: Secondary | ICD-10-CM | POA: Diagnosis not present

## 2020-02-16 DIAGNOSIS — N183 Chronic kidney disease, stage 3 unspecified: Secondary | ICD-10-CM | POA: Diagnosis not present

## 2020-02-19 DIAGNOSIS — I5043 Acute on chronic combined systolic (congestive) and diastolic (congestive) heart failure: Secondary | ICD-10-CM | POA: Diagnosis not present

## 2020-02-19 DIAGNOSIS — I4819 Other persistent atrial fibrillation: Secondary | ICD-10-CM | POA: Diagnosis not present

## 2020-02-19 DIAGNOSIS — I255 Ischemic cardiomyopathy: Secondary | ICD-10-CM | POA: Diagnosis not present

## 2020-02-19 DIAGNOSIS — I130 Hypertensive heart and chronic kidney disease with heart failure and stage 1 through stage 4 chronic kidney disease, or unspecified chronic kidney disease: Secondary | ICD-10-CM | POA: Diagnosis not present

## 2020-02-19 DIAGNOSIS — I2510 Atherosclerotic heart disease of native coronary artery without angina pectoris: Secondary | ICD-10-CM | POA: Diagnosis not present

## 2020-02-19 DIAGNOSIS — N183 Chronic kidney disease, stage 3 unspecified: Secondary | ICD-10-CM | POA: Diagnosis not present

## 2020-02-22 ENCOUNTER — Telehealth: Payer: Self-pay | Admitting: Cardiovascular Disease

## 2020-02-22 DIAGNOSIS — I255 Ischemic cardiomyopathy: Secondary | ICD-10-CM | POA: Diagnosis not present

## 2020-02-22 DIAGNOSIS — I2510 Atherosclerotic heart disease of native coronary artery without angina pectoris: Secondary | ICD-10-CM | POA: Diagnosis not present

## 2020-02-22 DIAGNOSIS — N183 Chronic kidney disease, stage 3 unspecified: Secondary | ICD-10-CM | POA: Diagnosis not present

## 2020-02-22 DIAGNOSIS — I4819 Other persistent atrial fibrillation: Secondary | ICD-10-CM | POA: Diagnosis not present

## 2020-02-22 DIAGNOSIS — I5043 Acute on chronic combined systolic (congestive) and diastolic (congestive) heart failure: Secondary | ICD-10-CM | POA: Diagnosis not present

## 2020-02-22 DIAGNOSIS — I130 Hypertensive heart and chronic kidney disease with heart failure and stage 1 through stage 4 chronic kidney disease, or unspecified chronic kidney disease: Secondary | ICD-10-CM | POA: Diagnosis not present

## 2020-02-22 NOTE — Telephone Encounter (Signed)
Peter with [facility] calling for orders Needing to expand physical therapy once a week for 5 weeks Please call at [phone]

## 2020-02-22 NOTE — Telephone Encounter (Signed)
Advised that if patients primary care provider does not approve then to fax order over to [place] and I would have provider sign. He verbalized understanding with no further questions at this time.

## 2020-02-23 DIAGNOSIS — I4819 Other persistent atrial fibrillation: Secondary | ICD-10-CM | POA: Diagnosis not present

## 2020-02-23 DIAGNOSIS — I130 Hypertensive heart and chronic kidney disease with heart failure and stage 1 through stage 4 chronic kidney disease, or unspecified chronic kidney disease: Secondary | ICD-10-CM | POA: Diagnosis not present

## 2020-02-23 DIAGNOSIS — N183 Chronic kidney disease, stage 3 unspecified: Secondary | ICD-10-CM | POA: Diagnosis not present

## 2020-02-23 DIAGNOSIS — I2510 Atherosclerotic heart disease of native coronary artery without angina pectoris: Secondary | ICD-10-CM | POA: Diagnosis not present

## 2020-02-23 DIAGNOSIS — I255 Ischemic cardiomyopathy: Secondary | ICD-10-CM | POA: Diagnosis not present

## 2020-02-23 DIAGNOSIS — I5043 Acute on chronic combined systolic (congestive) and diastolic (congestive) heart failure: Secondary | ICD-10-CM | POA: Diagnosis not present

## 2020-02-29 DIAGNOSIS — I5043 Acute on chronic combined systolic (congestive) and diastolic (congestive) heart failure: Secondary | ICD-10-CM | POA: Diagnosis not present

## 2020-02-29 DIAGNOSIS — I255 Ischemic cardiomyopathy: Secondary | ICD-10-CM | POA: Diagnosis not present

## 2020-02-29 DIAGNOSIS — I130 Hypertensive heart and chronic kidney disease with heart failure and stage 1 through stage 4 chronic kidney disease, or unspecified chronic kidney disease: Secondary | ICD-10-CM | POA: Diagnosis not present

## 2020-02-29 DIAGNOSIS — I4819 Other persistent atrial fibrillation: Secondary | ICD-10-CM | POA: Diagnosis not present

## 2020-02-29 DIAGNOSIS — N183 Chronic kidney disease, stage 3 unspecified: Secondary | ICD-10-CM | POA: Diagnosis not present

## 2020-02-29 DIAGNOSIS — I2510 Atherosclerotic heart disease of native coronary artery without angina pectoris: Secondary | ICD-10-CM | POA: Diagnosis not present

## 2020-03-02 DIAGNOSIS — I5043 Acute on chronic combined systolic (congestive) and diastolic (congestive) heart failure: Secondary | ICD-10-CM | POA: Diagnosis not present

## 2020-03-02 DIAGNOSIS — I255 Ischemic cardiomyopathy: Secondary | ICD-10-CM | POA: Diagnosis not present

## 2020-03-02 DIAGNOSIS — I4819 Other persistent atrial fibrillation: Secondary | ICD-10-CM | POA: Diagnosis not present

## 2020-03-02 DIAGNOSIS — I2510 Atherosclerotic heart disease of native coronary artery without angina pectoris: Secondary | ICD-10-CM | POA: Diagnosis not present

## 2020-03-02 DIAGNOSIS — Z7901 Long term (current) use of anticoagulants: Secondary | ICD-10-CM | POA: Diagnosis not present

## 2020-03-02 DIAGNOSIS — I130 Hypertensive heart and chronic kidney disease with heart failure and stage 1 through stage 4 chronic kidney disease, or unspecified chronic kidney disease: Secondary | ICD-10-CM | POA: Diagnosis not present

## 2020-03-02 DIAGNOSIS — Z9181 History of falling: Secondary | ICD-10-CM | POA: Diagnosis not present

## 2020-03-02 DIAGNOSIS — I493 Ventricular premature depolarization: Secondary | ICD-10-CM | POA: Diagnosis not present

## 2020-03-02 DIAGNOSIS — I714 Abdominal aortic aneurysm, without rupture: Secondary | ICD-10-CM | POA: Diagnosis not present

## 2020-03-02 DIAGNOSIS — N183 Chronic kidney disease, stage 3 unspecified: Secondary | ICD-10-CM | POA: Diagnosis not present

## 2020-03-02 DIAGNOSIS — Z952 Presence of prosthetic heart valve: Secondary | ICD-10-CM | POA: Diagnosis not present

## 2020-03-08 DIAGNOSIS — I130 Hypertensive heart and chronic kidney disease with heart failure and stage 1 through stage 4 chronic kidney disease, or unspecified chronic kidney disease: Secondary | ICD-10-CM | POA: Diagnosis not present

## 2020-03-08 DIAGNOSIS — I5043 Acute on chronic combined systolic (congestive) and diastolic (congestive) heart failure: Secondary | ICD-10-CM | POA: Diagnosis not present

## 2020-03-08 DIAGNOSIS — I4819 Other persistent atrial fibrillation: Secondary | ICD-10-CM | POA: Diagnosis not present

## 2020-03-08 DIAGNOSIS — I2510 Atherosclerotic heart disease of native coronary artery without angina pectoris: Secondary | ICD-10-CM | POA: Diagnosis not present

## 2020-03-08 DIAGNOSIS — N183 Chronic kidney disease, stage 3 unspecified: Secondary | ICD-10-CM | POA: Diagnosis not present

## 2020-03-08 DIAGNOSIS — I255 Ischemic cardiomyopathy: Secondary | ICD-10-CM | POA: Diagnosis not present

## 2020-03-09 DIAGNOSIS — N183 Chronic kidney disease, stage 3 unspecified: Secondary | ICD-10-CM | POA: Diagnosis not present

## 2020-03-09 DIAGNOSIS — I255 Ischemic cardiomyopathy: Secondary | ICD-10-CM | POA: Diagnosis not present

## 2020-03-09 DIAGNOSIS — I5043 Acute on chronic combined systolic (congestive) and diastolic (congestive) heart failure: Secondary | ICD-10-CM | POA: Diagnosis not present

## 2020-03-09 DIAGNOSIS — I130 Hypertensive heart and chronic kidney disease with heart failure and stage 1 through stage 4 chronic kidney disease, or unspecified chronic kidney disease: Secondary | ICD-10-CM | POA: Diagnosis not present

## 2020-03-09 DIAGNOSIS — I2510 Atherosclerotic heart disease of native coronary artery without angina pectoris: Secondary | ICD-10-CM | POA: Diagnosis not present

## 2020-03-09 DIAGNOSIS — I4819 Other persistent atrial fibrillation: Secondary | ICD-10-CM | POA: Diagnosis not present

## 2020-03-14 DIAGNOSIS — N183 Chronic kidney disease, stage 3 unspecified: Secondary | ICD-10-CM | POA: Diagnosis not present

## 2020-03-14 DIAGNOSIS — I5043 Acute on chronic combined systolic (congestive) and diastolic (congestive) heart failure: Secondary | ICD-10-CM | POA: Diagnosis not present

## 2020-03-14 DIAGNOSIS — I130 Hypertensive heart and chronic kidney disease with heart failure and stage 1 through stage 4 chronic kidney disease, or unspecified chronic kidney disease: Secondary | ICD-10-CM | POA: Diagnosis not present

## 2020-03-14 DIAGNOSIS — I4819 Other persistent atrial fibrillation: Secondary | ICD-10-CM | POA: Diagnosis not present

## 2020-03-14 DIAGNOSIS — I2510 Atherosclerotic heart disease of native coronary artery without angina pectoris: Secondary | ICD-10-CM | POA: Diagnosis not present

## 2020-03-14 DIAGNOSIS — I255 Ischemic cardiomyopathy: Secondary | ICD-10-CM | POA: Diagnosis not present

## 2020-03-18 DIAGNOSIS — I4819 Other persistent atrial fibrillation: Secondary | ICD-10-CM | POA: Diagnosis not present

## 2020-03-18 DIAGNOSIS — I2510 Atherosclerotic heart disease of native coronary artery without angina pectoris: Secondary | ICD-10-CM | POA: Diagnosis not present

## 2020-03-18 DIAGNOSIS — I5043 Acute on chronic combined systolic (congestive) and diastolic (congestive) heart failure: Secondary | ICD-10-CM | POA: Diagnosis not present

## 2020-03-18 DIAGNOSIS — N183 Chronic kidney disease, stage 3 unspecified: Secondary | ICD-10-CM | POA: Diagnosis not present

## 2020-03-18 DIAGNOSIS — I255 Ischemic cardiomyopathy: Secondary | ICD-10-CM | POA: Diagnosis not present

## 2020-03-18 DIAGNOSIS — I130 Hypertensive heart and chronic kidney disease with heart failure and stage 1 through stage 4 chronic kidney disease, or unspecified chronic kidney disease: Secondary | ICD-10-CM | POA: Diagnosis not present

## 2020-03-23 DIAGNOSIS — I4819 Other persistent atrial fibrillation: Secondary | ICD-10-CM | POA: Diagnosis not present

## 2020-03-23 DIAGNOSIS — I130 Hypertensive heart and chronic kidney disease with heart failure and stage 1 through stage 4 chronic kidney disease, or unspecified chronic kidney disease: Secondary | ICD-10-CM | POA: Diagnosis not present

## 2020-03-23 DIAGNOSIS — N183 Chronic kidney disease, stage 3 unspecified: Secondary | ICD-10-CM | POA: Diagnosis not present

## 2020-03-23 DIAGNOSIS — I2510 Atherosclerotic heart disease of native coronary artery without angina pectoris: Secondary | ICD-10-CM | POA: Diagnosis not present

## 2020-03-23 DIAGNOSIS — I5043 Acute on chronic combined systolic (congestive) and diastolic (congestive) heart failure: Secondary | ICD-10-CM | POA: Diagnosis not present

## 2020-03-23 DIAGNOSIS — I255 Ischemic cardiomyopathy: Secondary | ICD-10-CM | POA: Diagnosis not present

## 2020-03-29 ENCOUNTER — Telehealth: Payer: Self-pay | Admitting: Cardiovascular Disease

## 2020-03-29 DIAGNOSIS — I5043 Acute on chronic combined systolic (congestive) and diastolic (congestive) heart failure: Secondary | ICD-10-CM | POA: Diagnosis not present

## 2020-03-29 DIAGNOSIS — N183 Chronic kidney disease, stage 3 unspecified: Secondary | ICD-10-CM | POA: Diagnosis not present

## 2020-03-29 DIAGNOSIS — I255 Ischemic cardiomyopathy: Secondary | ICD-10-CM | POA: Diagnosis not present

## 2020-03-29 DIAGNOSIS — I130 Hypertensive heart and chronic kidney disease with heart failure and stage 1 through stage 4 chronic kidney disease, or unspecified chronic kidney disease: Secondary | ICD-10-CM | POA: Diagnosis not present

## 2020-03-29 DIAGNOSIS — I4819 Other persistent atrial fibrillation: Secondary | ICD-10-CM | POA: Diagnosis not present

## 2020-03-29 DIAGNOSIS — I2510 Atherosclerotic heart disease of native coronary artery without angina pectoris: Secondary | ICD-10-CM | POA: Diagnosis not present

## 2020-03-29 NOTE — Telephone Encounter (Signed)
Spoke with Collier Salina with [facility] and inquired if patient has seen his PCP. He states that he has not been to see them in a long time. So approved orders for extended physical therapy services. No further questions at this time.

## 2020-03-29 NOTE — Telephone Encounter (Signed)
Peter with home health PT calling Requesting orders for once a week for 4 more weeks Please call to discuss at [phone]

## 2020-03-30 ENCOUNTER — Ambulatory Visit (INDEPENDENT_AMBULATORY_CARE_PROVIDER_SITE_OTHER): Payer: Medicare Other | Admitting: Nurse Practitioner

## 2020-03-30 ENCOUNTER — Other Ambulatory Visit: Payer: Self-pay

## 2020-03-30 ENCOUNTER — Encounter: Payer: Self-pay | Admitting: Nurse Practitioner

## 2020-03-30 VITALS — BP 120/80 | HR 81 | Ht 72.0 in | Wt 238.0 lb

## 2020-03-30 DIAGNOSIS — Z952 Presence of prosthetic heart valve: Secondary | ICD-10-CM | POA: Diagnosis not present

## 2020-03-30 DIAGNOSIS — I10 Essential (primary) hypertension: Secondary | ICD-10-CM

## 2020-03-30 DIAGNOSIS — N183 Chronic kidney disease, stage 3 unspecified: Secondary | ICD-10-CM

## 2020-03-30 DIAGNOSIS — I4819 Other persistent atrial fibrillation: Secondary | ICD-10-CM | POA: Diagnosis not present

## 2020-03-30 DIAGNOSIS — I5032 Chronic diastolic (congestive) heart failure: Secondary | ICD-10-CM

## 2020-03-30 DIAGNOSIS — E782 Mixed hyperlipidemia: Secondary | ICD-10-CM | POA: Diagnosis not present

## 2020-03-30 DIAGNOSIS — I2510 Atherosclerotic heart disease of native coronary artery without angina pectoris: Secondary | ICD-10-CM

## 2020-03-30 NOTE — Patient Instructions (Signed)
Medication Instructions:  Your physician recommends that you continue on your current medications as directed. Please refer to the Current Medication list given to you today.  *If you need a refill on your cardiac medications before your next appointment, please call your pharmacy*   Lab Work: Your physician recommends that you have lab work today(CBC, BMET)  If you have labs (blood work) drawn today and your tests are completely normal, you will receive your results only by:  MyChart Message (if you have MyChart) OR  A paper copy in the mail If you have any lab test that is abnormal or we need to change your treatment, we will call you to review the results.   Testing/Procedures: None ordered   Follow-Up: At [facility], you and your health needs are our priority.  As part of our continuing mission to provide you with exceptional heart care, we have created designated Provider Care Teams.  These Care Teams include your primary Cardiologist (physician) and Advanced Practice Providers (APPs -  Physician Assistants and Nurse Practitioners) who all work together to provide you with the care you need, when you need it.  We recommend signing up for the patient portal called "MyChart".  Sign up information is provided on this After Visit Summary.  MyChart is used to connect with patients for Virtual Visits (Telemedicine).  Patients are able to view lab/test results, encounter notes, upcoming appointments, etc.  Non-urgent messages can be sent to your provider as well.   To learn more about what you can do with MyChart, go to [email or web address].    Your next appointment:  3 month(s)  The format for your next appointment:   In Person  Provider:    You may see Kathlyn Sacramento, MD or Murray Hodgkins, NP

## 2020-03-30 NOTE — Progress Notes (Signed)
Office Visit    Patient Name: Clinton Holmes Date of Encounter: 03/30/2020  Primary Care Provider:  Leonides Sake, MD Primary Cardiologist:  Kathlyn Sacramento, MD  Chief Complaint    86 year old male with a history of paroxysmal atrial fibrillation, nonobstructive CAD, severe aortic stenosis status post TAVR in March 2021, hypertension, hyperlipidemia, stage III chronic kidney disease, nonischemic cardiomyopathy and heart failure with improved EF (55-60% April 2021), PVCs, and stroke, who presents for follow-up related to HFpEF and Afib.  Past Medical History    Past Medical History:  Diagnosis Date  . AAA (abdominal aortic aneurysm) ([place])   . Carotid arterial disease ([place])    a. 09/2019 Carotid U/S: RICA 3-61%, LICA 4-43%.  . CKD (chronic kidney disease), stage III   . Essential hypertension   . NICM (nonischemic cardiomyopathy) ([place])    a. 08/2019 Echo: EF 25-30%; b. 11/2019 Echo: EF 55-60% (following TAVR 10/2019).  . Nonobstructive CAD (coronary artery disease)    a. 12/23/2014 Cath: elevated LVEDP, prox LAD 45%, 60% ramus; b. 09/2019 Cath: LM nl, LAD 40p/m, RI 50, RCA large, 20p/m.  . OA (osteoarthritis)    a. 2011 s/p bilat TKA.  . Pneumonia    "about 3 years ago"  . S/P TAVR (transcatheter aortic valve replacement) 10/27/2019   a. 10/2019 TAVR: 51mm Edwards Sapien THV via the subclavian approach by Dr. Sanda Linger & Dr. Cyndia Bent; b. 11/2019 Echo: EF 55-60%, mild LVH, gr1 DD, PASP 45.40mmHg. Midlly dil LA. Mild MR, mild to mod TR. Triv paravalvular AI. Ao dil - 35mm.  . Severe aortic stenosis    a. 10/2019 s/p TAVR.  Marland Kitchen Stroke w/ worsening vision and visual hallucinations    a. 10/2019 MRI: scattered, widespread acute infarcts in bilat post circulation. Petechial hemorrhage involving R occiput & bilat cerebellum.  . Ventricular bigeminy    a. 12/2014. Started on amio on 12/24/2014   Past Surgical History:  Procedure Laterality Date  . CARDIAC CATHETERIZATION N/A 12/23/2014    Procedure: Right/Left Heart Cath and Coronary Angiography;  Surgeon: Belva Crome, MD;  Location: [place] [facility];  Service: Cardiovascular;  Laterality: N/A;  . CARDIAC VALVE REPLACEMENT  10/27/2019  . CARDIOVERSION N/A 08/19/2019   Procedure: CARDIOVERSION;  Surgeon: Minna Merritts, MD;  Location: [facility];  Service: Cardiovascular;  Laterality: N/A;  . KNEE SURGERY    . RIGHT/LEFT HEART CATH AND CORONARY ANGIOGRAPHY N/A 09/23/2019   Procedure: RIGHT/LEFT HEART CATH AND CORONARY ANGIOGRAPHY;  Surgeon: Burnell Blanks, MD;  Location: [place] [facility];  Service: Cardiovascular;  Laterality: N/A;  . SHOULDER SURGERY Left   . TEE WITHOUT CARDIOVERSION N/A 08/19/2019   Procedure: TRANSESOPHAGEAL ECHOCARDIOGRAM (TEE);  Surgeon: Minna Merritts, MD;  Location: [facility];  Service: Cardiovascular;  Laterality: N/A;  . TEE WITHOUT CARDIOVERSION N/A 10/27/2019   Procedure: TRANSESOPHAGEAL ECHOCARDIOGRAM (TEE);  Surgeon: Burnell Blanks, MD;  Location: [place];  Service: Open Heart Surgery;  Laterality: N/A;  . TONSILLECTOMY      Allergies  No Known Allergies  History of Present Illness    86 year old male with above complex past medical history including paroxysmal atrial fibrillation, nonobstructive CAD, severe aortic stenosis status post TAVR in March 2021, hypertension, hyperlipidemia, stage III chronic kidney disease, nonischemic cardiomyopathy and heart failure with an improved EF, PVCs, and stroke.  He was hospitalized at [facility] in January 2021 with atrial fibrillation and RVR.  Echo showed an EF of 25-30% with severe aortic stenosis.  He underwent TEE guided cardioversion and was started on Eliquis.  He subsequently underwent right left heart cardiac catheterization 5 oh 2021 showing mild to moderate nonobstructive CAD and normal filling pressures.  He underwent successful TAVR in March 2021 with postop echo in April showing an EF of 55-60%.  His hospital course was complicated by  A. fib with RVR new left bundle branch block, though he converted to sinus rhythm prior to discharge.  He subsequently developed blurred vision and visual hallucinations with MRI of the brain showing evidence of embolic strokes likely related to his TAVR procedure or postop A. fib.  Eliquis was interrupted initially due to concerns about risk of hemorrhagic transformation but was subsequently resumed.  He was last seen in cardiology clinic in May, at which time he was doing reasonably well but continued to report deteriorating vision and visual hallucinations since stroke.  He was noted be in atrial fibrillation at that visit but was asymptomatic.  Decision was made to continue rate control.  It was noted that based on his creatinine, Eliquis dose should be 5 mg twice daily however, decision was made to hold off on changing given recent stroke with plan for follow-up labs and dose adjustment today.  Since his last visit, he has done well.  His activity is quite limited but he does continue to have physical therapy a few times a week.  His daughter is with him today and notes that PT feels as though he was weaker at his most recent visit.  Patient denies chest pain, dyspnea, palpitations, PND, orthopnea, dizziness, syncope, or early satiety.  He does have mild lower extremity swelling, which is worse at the end of the day.  He is tired of taking Lasix and if he could, he would like to get off of it or at least changed to as needed.  He continues to struggle with chronic left eye and more recent right eye visual disturbances ever since his stroke in March.  He does have a cataract in the eye and has been seen by ophthalmology.  For now, he is choosing conservative therapy.  Home Medications    Prior to Admission medications   Medication Sig Start Date End Date Taking? Authorizing Provider  amLODipine (NORVASC) 5 MG tablet Take 1 tablet (5 mg total) by mouth daily. 11/04/19 10/29/20  Eileen Stanford, PA-C    amoxicillin (AMOXIL) 500 MG capsule Take 2,000 mg by mouth See admin instructions. Take 4 capsules (2000 mg) by mouth 1 hour prior to dental appointment 10/15/19   [provider]  apixaban (ELIQUIS) 2.5 MG TABS tablet Take 1 tablet (2.5 mg total) by mouth 2 (two) times daily. 11/26/19   Eileen Stanford, PA-C  furosemide (LASIX) 40 MG tablet Take 1 tablet (40 mg total) by mouth daily. 08/27/19   Burtis Junes, NP  metoprolol succinate (TOPROL-XL) 50 MG 24 hr tablet Take 1 tablet (50 mg total) by mouth 2 (two) times daily. Take with or immediately following a meal. 08/26/19   Burtis Junes, NP  Multiple Vitamin (MULTIVITAMIN WITH MINERALS) TABS tablet Take 1 tablet by mouth daily. One-A-Day for Adults 50+    [provider]    Review of Systems    Chronic, mild lower extremity swelling.  He denies chest pain, palpitations, dyspnea, PND, orthopnea, dizziness, syncope, or early satiety.  All other systems reviewed and are otherwise negative except as noted above.  Physical Exam    VS:  BP 120/80 (BP  Location: Left Arm, Patient Position: Sitting, Cuff Size: Normal)   Pulse 81   Ht 6' (1.829 m)   Wt 238 lb (108 kg)   SpO2 97%   BMI 32.28 kg/m  , BMI Body mass index is 32.28 kg/m. GEN: Well nourished, well developed, in no acute distress. HEENT: normal. Neck: Supple, no JVD, carotid bruits, or masses. Cardiac: Irregularly irregular with a 2/6 systolic murmur heard throughout.  This is more blowing in quality at the apex.  No rubs, or gallops. No clubbing, cyanosis, trace bilateral ankle edema.  Radials/DP/PT 1+ and equal bilaterally.  Respiratory:  Respirations regular and unlabored, clear to auscultation bilaterally. GI: Soft, nontender, nondistended, BS + x 4. MS: no deformity or atrophy. Skin: warm and dry, no rash. Neuro:  Strength and sensation are intact. Psych: Normal affect.  Accessory Clinical Findings    ECG personally reviewed by me today -atrial  fibrillation, 81, PVC, left bundle branch block- no acute changes.  Lab Results  Component Value Date   WBC 9.6 10/28/2019   HGB 12.6 (L) 10/28/2019   HCT 38.4 (L) 10/28/2019   MCV 91.4 10/28/2019   PLT 138 (L) 10/28/2019   Lab Results  Component Value Date   CREATININE 1.42 (H) 10/28/2019   BUN 21 10/28/2019   NA 141 10/28/2019   K 4.0 10/28/2019   CL 106 10/28/2019   CO2 23 10/28/2019   Lab Results  Component Value Date   ALT 31 10/23/2019   AST 32 10/23/2019   ALKPHOS 55 10/23/2019   BILITOT 0.7 10/23/2019   Lab Results  Component Value Date   CHOL 148 07/24/2018   HDL 37 (L) 07/24/2018   LDLCALC 103 (H) 07/24/2018   TRIG 41 07/24/2018   CHOLHDL 4.0 07/24/2018    Lab Results  Component Value Date   HGBA1C 5.3 10/23/2019    Assessment & Plan    1.  Persistent atrial fibrillation: Patient remains in atrial fibrillation today but is rate controlled at 81 and asymptomatic.  He remains on beta-blocker therapy and is anticoagulated with Eliquis.  As noted above, he is currently on Eliquis 2.5 mg twice daily.  I will follow-up labs today.  Provided that creatinine is less than 1.5, I will plan to increase his Eliquis dose to 5 mg twice daily.  2.  Severe aortic stenosis status post TAVR: Doing well without chest pain or dyspnea.  Echo in April showed normal functioning prosthesis with only trivial paravalvular insufficiency.  3.  Postoperative CVA: Continues to have deteriorating vision in the right eye with occasional though fewer visual hallucinations.  He has chronic left eye visual disturbances after prior stroke.  He has been seen by ophthalmology and is known to have a cataract in the right as well.  We discussed his Eliquis dosing as outlined in #1.  4.  Essential hypertension: Stable.  5.  Hyperlipidemia: Not on statin in the setting of prior myalgias.  LDL was 103 in December 2019.  6.  Stage III chronic kidney disease: His daughter is concerned that he is not  drinking enough water.  He is on Lasix daily.  Follow-up basic metabolic panel today.  7.  HFpEF: Only trace ankle edema on exam today.  He questions whether or not he might be able to come off of Lasix.  We discussed that it is possible that he could take this as needed so long as he is weighing daily.  Currently, he is weighing every other day.  Await  lab results though it may be feasible for him to take this as needed.  Heart rate and blood pressure stable continue current regimen otherwise.  8.  Disposition: Follow-up CBC and basic metabolic panel today.  Follow-up in clinic 3 months or sooner if necessary.  Murray Hodgkins, NP 03/30/2020, 12:48 PM

## 2020-03-31 ENCOUNTER — Telehealth: Payer: Self-pay

## 2020-03-31 DIAGNOSIS — I2510 Atherosclerotic heart disease of native coronary artery without angina pectoris: Secondary | ICD-10-CM | POA: Diagnosis not present

## 2020-03-31 DIAGNOSIS — I255 Ischemic cardiomyopathy: Secondary | ICD-10-CM | POA: Diagnosis not present

## 2020-03-31 DIAGNOSIS — N183 Chronic kidney disease, stage 3 unspecified: Secondary | ICD-10-CM | POA: Diagnosis not present

## 2020-03-31 DIAGNOSIS — I5043 Acute on chronic combined systolic (congestive) and diastolic (congestive) heart failure: Secondary | ICD-10-CM | POA: Diagnosis not present

## 2020-03-31 DIAGNOSIS — I130 Hypertensive heart and chronic kidney disease with heart failure and stage 1 through stage 4 chronic kidney disease, or unspecified chronic kidney disease: Secondary | ICD-10-CM | POA: Diagnosis not present

## 2020-03-31 DIAGNOSIS — I4819 Other persistent atrial fibrillation: Secondary | ICD-10-CM

## 2020-03-31 LAB — BASIC METABOLIC PANEL
BUN/Creatinine Ratio: 14 (ref 10–24)
BUN: 23 mg/dL (ref 10–36)
CO2: 24 mmol/L (ref 20–29)
Calcium: 10 mg/dL (ref 8.6–10.2)
Chloride: 102 mmol/L (ref 96–106)
Creatinine, Ser: 1.65 mg/dL — ABNORMAL HIGH (ref 0.76–1.27)
GFR calc Af Amer: 42 mL/min/{1.73_m2} — ABNORMAL LOW (ref >59)
GFR calc non Af Amer: 36 mL/min/{1.73_m2} — ABNORMAL LOW (ref >59)
Glucose: 105 mg/dL — ABNORMAL HIGH (ref 65–99)
Potassium: 4.2 mmol/L (ref 3.5–5.2)
Sodium: 141 mmol/L (ref 134–144)

## 2020-03-31 LAB — CBC
Hematocrit: 38.8 % (ref 37.5–51.0)
Hemoglobin: 13 g/dL (ref 13.0–17.7)
MCH: 30 pg (ref 26.6–33.0)
MCHC: 33.5 g/dL (ref 31.5–35.7)
MCV: 89 fL (ref 79–97)
Platelets: 149 10*3/uL — ABNORMAL LOW (ref 150–450)
RBC: 4.34 x10E6/uL (ref 4.14–5.80)
RDW: 13.3 % (ref 11.6–15.4)
WBC: 7.6 10*3/uL (ref 3.4–10.8)

## 2020-03-31 MED ORDER — FUROSEMIDE 40 MG PO TABS: 20.0000 mg | ORAL_TABLET | Freq: Every day | ORAL | 6 refills | Status: AC

## 2020-03-31 NOTE — Telephone Encounter (Signed)
Call to patient to review labs.    Spoke to daughter [name], okay per DPR. She verbalized understanding and has no further questions at this time.    Advised pt to call for any further questions or concerns.  Orders updated as advised.

## 2020-03-31 NOTE — Telephone Encounter (Signed)
-----   Message from Theora Gianotti, NP sent at 03/31/2020  7:42 AM EDT ----- Blood counts ok. Kidney's appear dry w/ creatinine elevated to 1.65.  His dtr was concerned that he wasn't drinking enough water and in combination with lasix therapy, this appears accurate.   1. Increase hydration.  2. Reduce lasix to 20mg  daily. 3. Cont current dose of eliquis 2.5mg  bid given higher creatinine (we discussed possibly changing to higher dose yesterday, depending on labs). F/u bmet in 2 wks.

## 2020-04-01 DIAGNOSIS — Z7901 Long term (current) use of anticoagulants: Secondary | ICD-10-CM | POA: Diagnosis not present

## 2020-04-01 DIAGNOSIS — I4819 Other persistent atrial fibrillation: Secondary | ICD-10-CM | POA: Diagnosis not present

## 2020-04-01 DIAGNOSIS — I493 Ventricular premature depolarization: Secondary | ICD-10-CM | POA: Diagnosis not present

## 2020-04-01 DIAGNOSIS — Z952 Presence of prosthetic heart valve: Secondary | ICD-10-CM | POA: Diagnosis not present

## 2020-04-01 DIAGNOSIS — I5043 Acute on chronic combined systolic (congestive) and diastolic (congestive) heart failure: Secondary | ICD-10-CM | POA: Diagnosis not present

## 2020-04-01 DIAGNOSIS — Z9181 History of falling: Secondary | ICD-10-CM | POA: Diagnosis not present

## 2020-04-01 DIAGNOSIS — I255 Ischemic cardiomyopathy: Secondary | ICD-10-CM | POA: Diagnosis not present

## 2020-04-01 DIAGNOSIS — I130 Hypertensive heart and chronic kidney disease with heart failure and stage 1 through stage 4 chronic kidney disease, or unspecified chronic kidney disease: Secondary | ICD-10-CM | POA: Diagnosis not present

## 2020-04-01 DIAGNOSIS — I714 Abdominal aortic aneurysm, without rupture: Secondary | ICD-10-CM | POA: Diagnosis not present

## 2020-04-01 DIAGNOSIS — I2510 Atherosclerotic heart disease of native coronary artery without angina pectoris: Secondary | ICD-10-CM | POA: Diagnosis not present

## 2020-04-01 DIAGNOSIS — N183 Chronic kidney disease, stage 3 unspecified: Secondary | ICD-10-CM | POA: Diagnosis not present

## 2020-04-04 DIAGNOSIS — I509 Heart failure, unspecified: Secondary | ICD-10-CM | POA: Diagnosis not present

## 2020-04-04 DIAGNOSIS — I110 Hypertensive heart disease with heart failure: Secondary | ICD-10-CM | POA: Diagnosis not present

## 2020-04-04 DIAGNOSIS — I444 Left anterior fascicular block: Secondary | ICD-10-CM | POA: Diagnosis not present

## 2020-04-04 DIAGNOSIS — R000 Tachycardia, unspecified: Secondary | ICD-10-CM | POA: Diagnosis not present

## 2020-04-04 DIAGNOSIS — Z7901 Long term (current) use of anticoagulants: Secondary | ICD-10-CM | POA: Diagnosis not present

## 2020-04-04 DIAGNOSIS — G931 Anoxic brain damage, not elsewhere classified: Secondary | ICD-10-CM | POA: Diagnosis not present

## 2020-04-04 DIAGNOSIS — I499 Cardiac arrhythmia, unspecified: Secondary | ICD-10-CM | POA: Diagnosis not present

## 2020-04-04 DIAGNOSIS — R0689 Other abnormalities of breathing: Secondary | ICD-10-CM | POA: Diagnosis not present

## 2020-04-04 DIAGNOSIS — Z602 Problems related to living alone: Secondary | ICD-10-CM | POA: Diagnosis not present

## 2020-04-04 DIAGNOSIS — R404 Transient alteration of awareness: Secondary | ICD-10-CM | POA: Diagnosis not present

## 2020-04-04 DIAGNOSIS — I469 Cardiac arrest, cause unspecified: Secondary | ICD-10-CM | POA: Diagnosis not present

## 2020-04-04 DIAGNOSIS — R0902 Hypoxemia: Secondary | ICD-10-CM | POA: Diagnosis not present

## 2020-04-04 DIAGNOSIS — Z20822 Contact with and (suspected) exposure to covid-19: Secondary | ICD-10-CM | POA: Diagnosis not present

## 2020-04-13 DEATH — deceased

## 2020-07-05 ENCOUNTER — Ambulatory Visit: Payer: Medicare Other | Admitting: Nurse Practitioner

## 2020-08-26 IMAGING — CT CT CTA ABD/PEL W/CM AND/OR W/O CM
2 of 16 series · 10 of 46 positions shown, 15 images · IV contrast (APPLIED)
Comparison: 10/30/2018 chest CT angiogram.

CLINICAL DATA: Severe aortic stenosis. Pre-TAVR planning.

EXAM:
CT ANGIOGRAPHY CHEST, ABDOMEN AND PELVIS
TECHNIQUE: Multidetector CT imaging through the chest, abdomen and pelvis was
performed using the standard protocol during bolus administration of
intravenous contrast. Multiplanar reconstructed images and MIPs were
obtained and reviewed to evaluate the vascular anatomy.
CONTRAST:  80mL OMNIPAQUE IOHEXOL 350 MG/ML SOLN

[Series 9: 5-95% · axial · 0.39mm/px · z∈[+1,+181]mm · 9 of 3670 slices shown, 13 images]
[im 334/3670  soft-tissue]
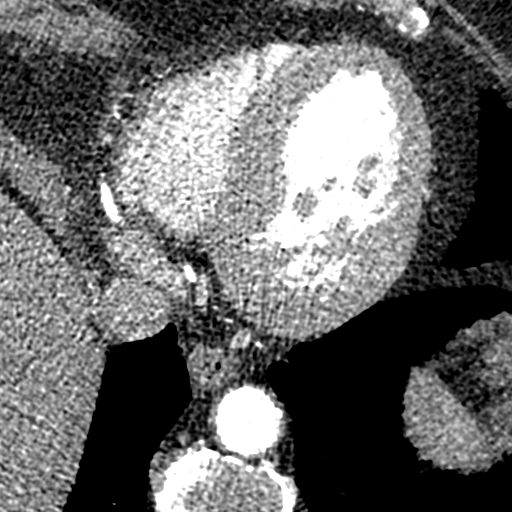
[im 334/3670  bone]
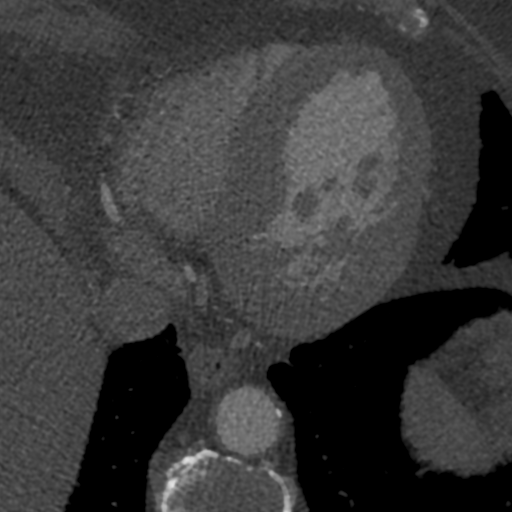
[im 668/3670  soft-tissue]
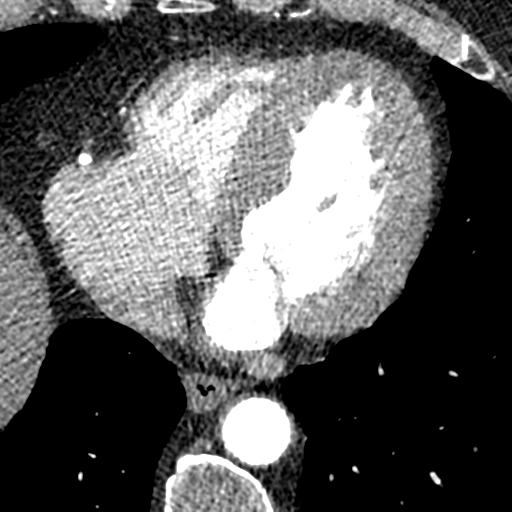
[im 1335/3670  soft-tissue]
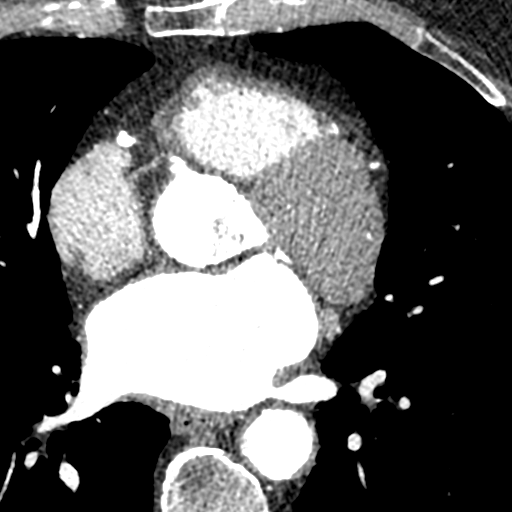
[im 1668/3670  soft-tissue]
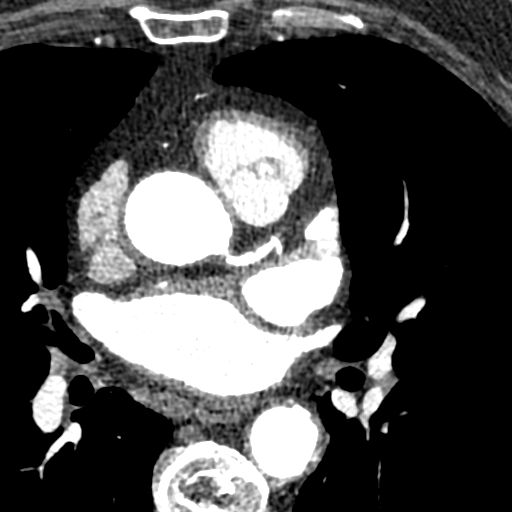
[im 2002/3670  soft-tissue]
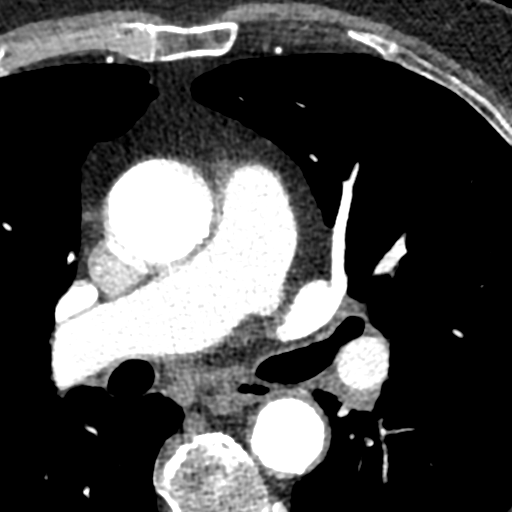
[im 2335/3670  soft-tissue]
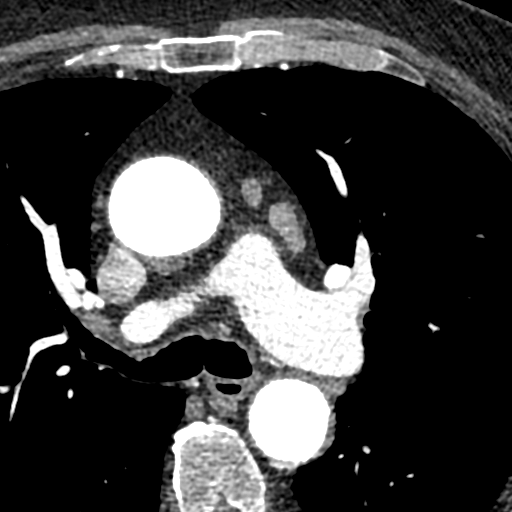
[im 2335/3670  lung]
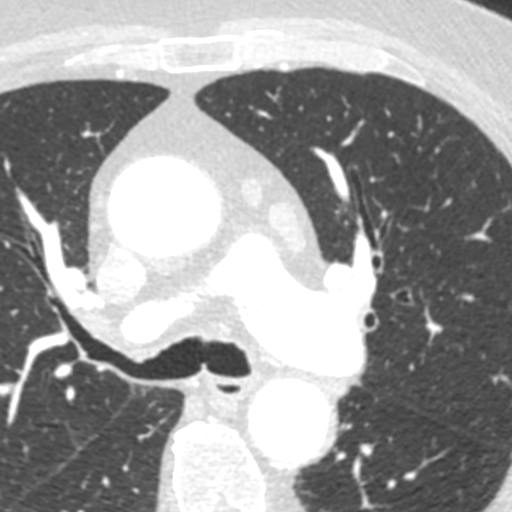
[im 2669/3670  lung]
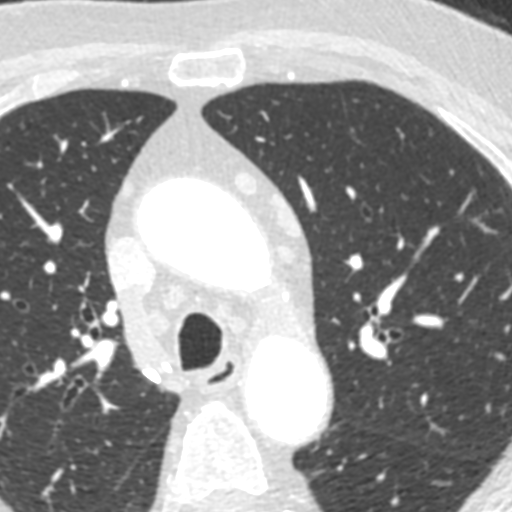
[im 3002/3670  soft-tissue]
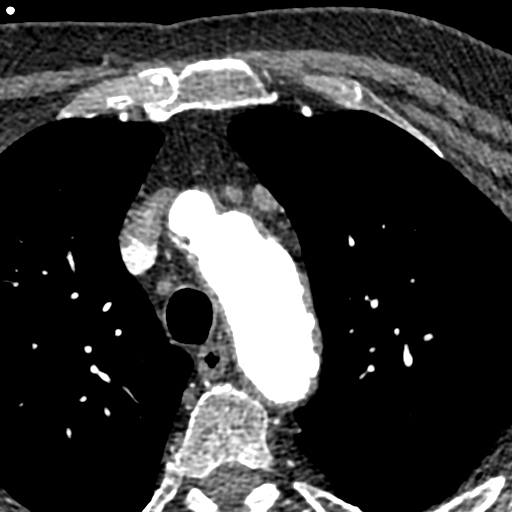
[im 3002/3670  lung]
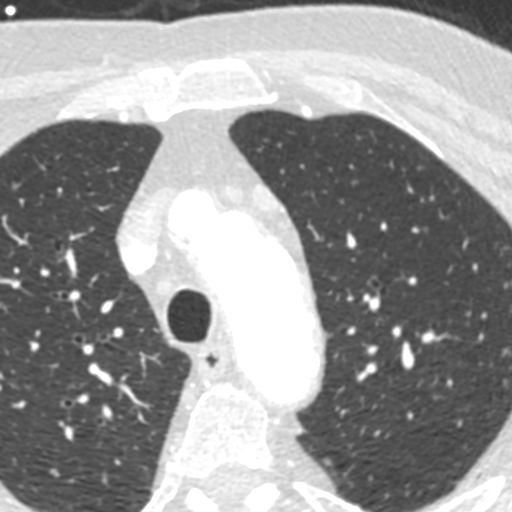
[im 3336/3670  soft-tissue]
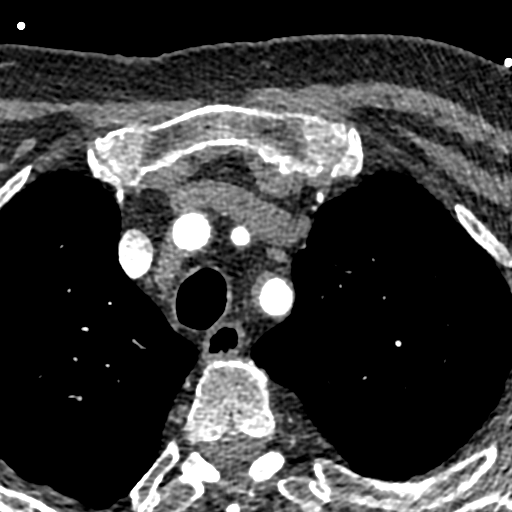
[im 3336/3670  lung]
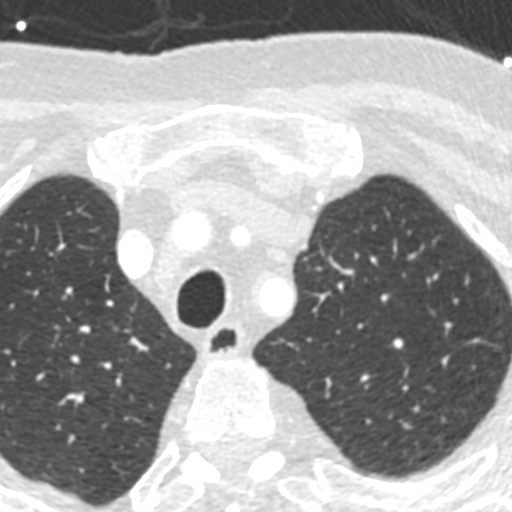

[Series 17: cor · coronal · 0.93mm/px · 1 of 162 slices shown, 2 images]
[im 81/162  soft-tissue]
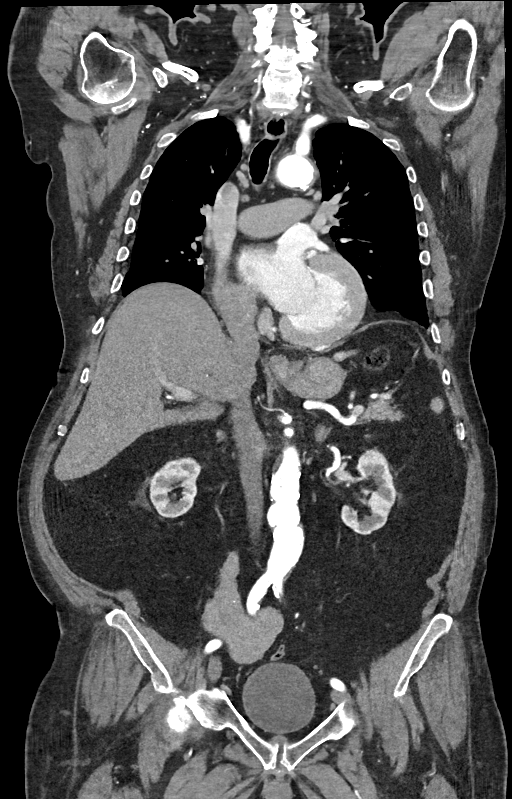
[im 81/162  bone]
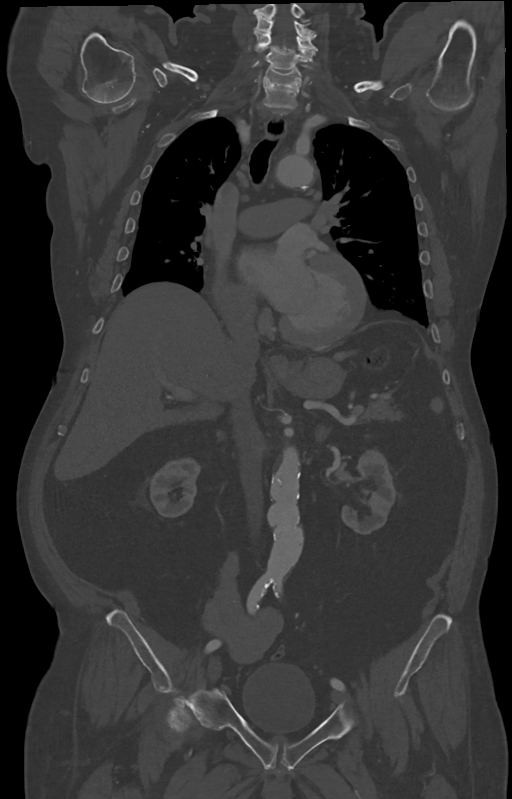

[10 of 46 positions shown; findings below may reference images not displayed]

FINDINGS: CTA CHEST FINDINGS

Cardiovascular: Mild cardiomegaly. No significant pericardial
effusion/thickening. Diffuse thickening and coarse calcification of
the aortic valve. Left anterior descending and right coronary
atherosclerosis. Atherosclerotic thoracic aorta with ectatic 4.3 cm
ascending thoracic aorta. Normal caliber main pulmonary artery. No
central pulmonary emboli.

Mediastinum/Nodes: Subcentimeter hypodense right thyroid nodules are
not appreciably changed and require no follow-up. Unremarkable
esophagus. No pathologically enlarged axillary, mediastinal or hilar
lymph nodes.

Lungs/Pleura: No pneumothorax. No pleural effusion. No acute
consolidative airspace disease, lung masses or significant pulmonary
nodules. Nonspecific patchy subpleural reticulation and ground-glass
opacity at both lung bases, not substantially changed since
10/30/2018 CT.

Musculoskeletal:  No aggressive appearing focal osseous lesions.

CTA ABDOMEN AND PELVIS FINDINGS

Hepatobiliary: Normal liver size. Simple 2.0 cm anterior left liver
cyst. Scattered subcentimeter hypodense left liver lesions are too
small to characterize and unchanged from 10/30/2018 CT, considered
benign. Normal gallbladder with no radiopaque cholelithiasis. No
biliary ductal dilatation.

Pancreas: Stable splenule at the pancreatic tail measuring 3.0 x
cm. Otherwise normal pancreas with no additional pancreatic lesions
or pancreatic duct dilation.

Spleen: Normal size. No mass.

Adrenals/Urinary Tract: Normal adrenals. No hydronephrosis. Soft
tissue density exophytic 3.0 cm renal cortical lesion in the
interpolar left kidney (series 15/image 145), not substantially
changed since 07/04/2013 CT, characterized as benign
hemorrhagic/proteinaceous renal cyst on 07/21/2013 MRI. Exophytic
simple 1.0 cm interpolar left renal cyst. No additional contour
deforming renal lesions. Normal bladder.

Stomach/Bowel: Small hiatal hernia. Otherwise normal nondistended
stomach. Normal caliber small bowel with no small bowel wall
thickening. Candidate normal diminutive appendix. Normal large bowel
with no diverticulosis, large bowel wall thickening or pericolonic
fat stranding.

Vascular/Lymphatic: Atherosclerotic abdominal aorta with 3.4 cm
infrarenal abdominal aortic aneurysm, unchanged using similar
measurement technique. No pathologically enlarged lymph nodes in the
abdomen or pelvis.

Reproductive: Mildly enlarged prostate with nonspecific coarse
internal prostatic calcifications.

Other: No pneumoperitoneum, ascites or focal fluid collection.

Musculoskeletal: No aggressive appearing focal osseous lesions.
Marked lumbar spondylosis.

VASCULAR MEASUREMENTS PERTINENT TO TAVR:

AORTA:

Minimal Aortic Liameter-UC.K x 15.6 mm

Severity of Aortic Calcification-moderate to severe

RIGHT PELVIS:

Right Common Iliac Artery -

Minimal Ciameter-T.H x 8.7 mm

Tortuosity-mild-to-moderate

Calcification-moderate

Right External Iliac Artery -

Minimal Tiameter-V.V x 8.7 mm

Tortuosity-severe

Calcification-none

Right Common Femoral Artery -

Minimal Fiameter-Y.T x 8.4 mm

Tortuosity-mild

Calcification-none

LEFT PELVIS:

Left Common Iliac Artery -

Minimal Ciameter-T.H x 5.9 mm

Tortuosity-mild

Calcification-moderate

Left External Iliac Artery -

Minimal Biameter-L.X x 8.0 mm

Tortuosity-mild

Calcification-mild

Left Common Femoral Artery -

Minimal Ziameter-4.M x 6.5 mm

Tortuosity-mild

Calcification-moderate

Review of the MIP images confirms the above findings.
IMPRESSION: 1. Vascular findings and measurements pertinent to potential TAVR
procedure, as detailed.
2. Diffuse thickening and coarse calcification of the aortic valve,
compatible with reported history of severe aortic stenosis.
3. Mild cardiomegaly. Two-vessel coronary atherosclerosis.
4. Stable 3.4 cm infrarenal abdominal aortic aneurysm. Abdominal
Aortic Aneurysm (ZNPQ1-HW9.9). Recommend follow-up aortic ultrasound
in 3 years. This recommendation follows ACR consensus guidelines:
White Paper of the ACR Incidental Findings Committee II on Vascular
Findings. [HOSPITAL] 9920; [DATE].
5. Small hiatal hernia.
6. Mildly enlarged prostate.
7.  Aortic Atherosclerosis (ZNPQ1-KFA.A).

## 2020-09-20 IMAGING — DX DG CHEST 1V PORT
1 series · 1 of 1 positions shown · non-contrast
Comparison: None.

CLINICAL DATA: Status post aortic valve replacement

EXAM:
PORTABLE CHEST 1 VIEW

[chest ap]
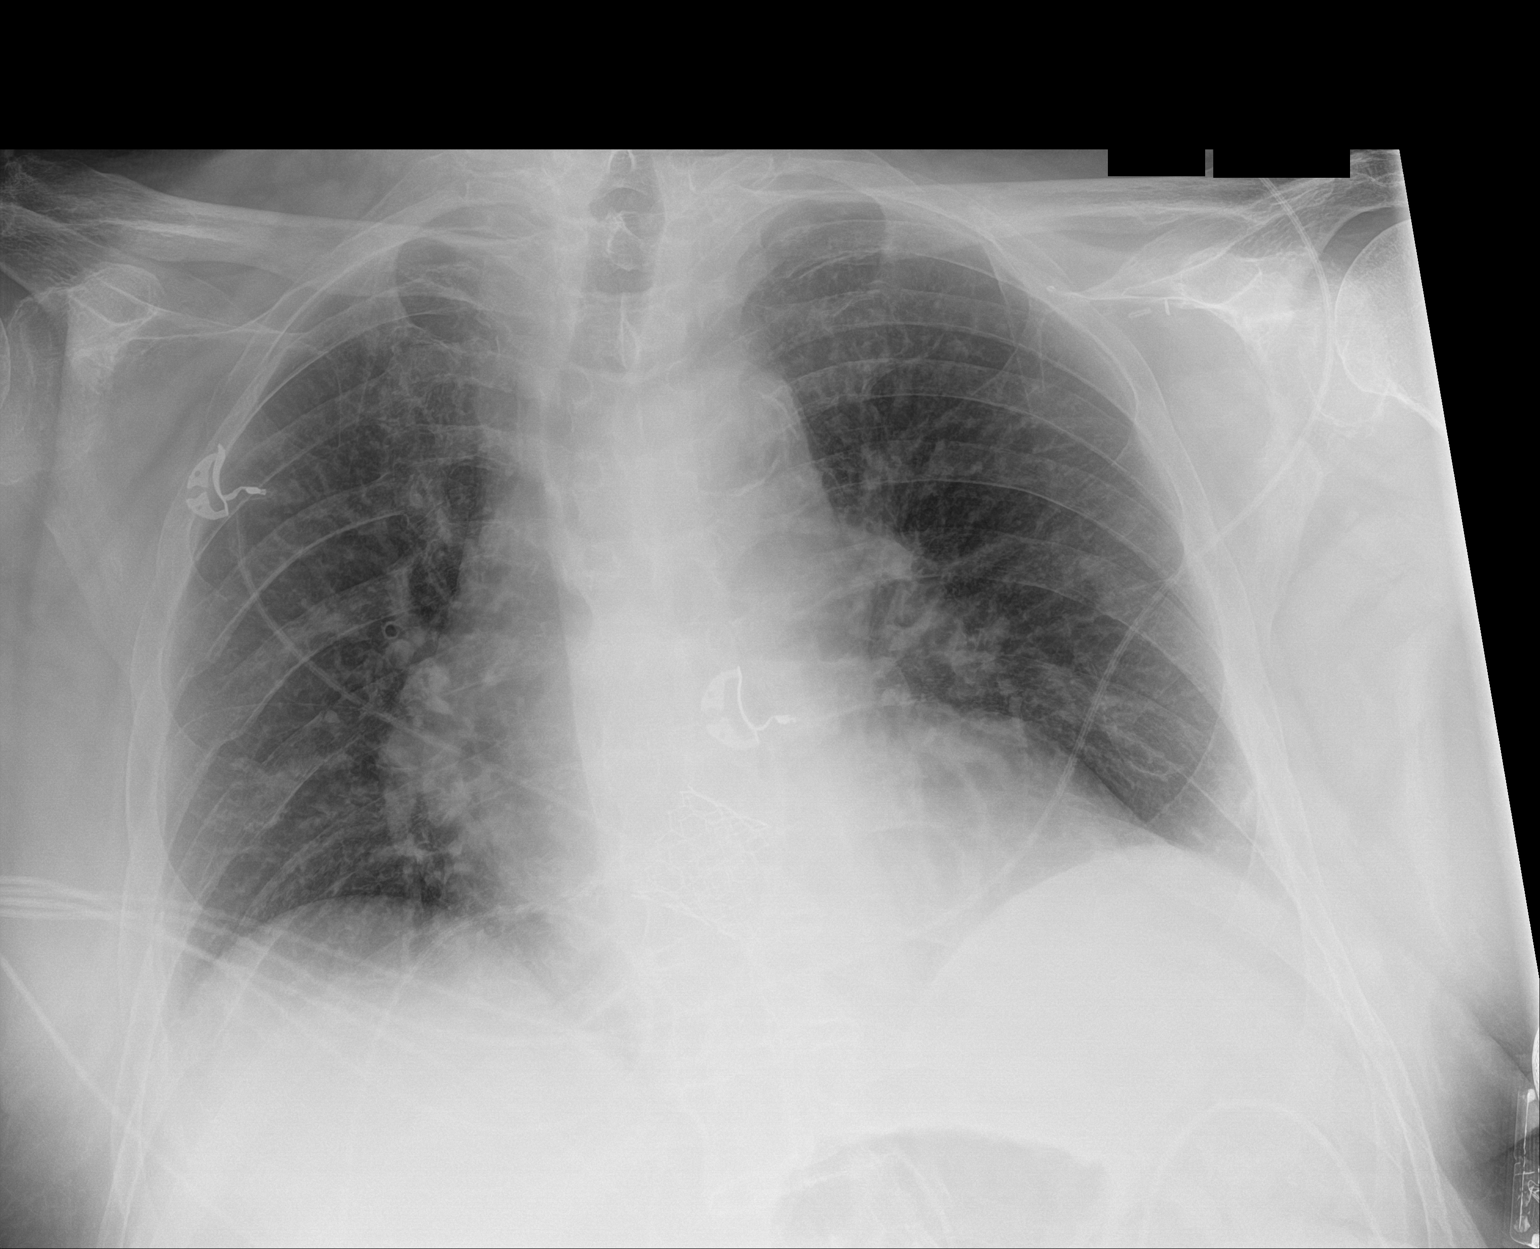

[1 of 1 positions shown; findings below may reference images not displayed]

FINDINGS: The heart size and mediastinal contours are within normal limits.
Both lungs are clear. The visualized skeletal structures are
unremarkable.
IMPRESSION: No active disease.
